# Patient Record
Sex: Male | Born: 1940 | Race: White | Hispanic: No | Marital: Married | Smoking: Former smoker
Health system: Southern US, Community
[De-identification: ages and names within clinical notes are randomized; demographics above are authoritative.]

## PROBLEM LIST (undated history)

## (undated) DIAGNOSIS — I714 Abdominal aortic aneurysm, without rupture, unspecified: Secondary | ICD-10-CM

## (undated) DIAGNOSIS — E119 Type 2 diabetes mellitus without complications: Secondary | ICD-10-CM

## (undated) DIAGNOSIS — C3492 Malignant neoplasm of unspecified part of left bronchus or lung: Secondary | ICD-10-CM

## (undated) DIAGNOSIS — C3490 Malignant neoplasm of unspecified part of unspecified bronchus or lung: Secondary | ICD-10-CM

## (undated) DIAGNOSIS — Z720 Tobacco use: Secondary | ICD-10-CM

## (undated) DIAGNOSIS — G4733 Obstructive sleep apnea (adult) (pediatric): Secondary | ICD-10-CM

## (undated) DIAGNOSIS — E785 Hyperlipidemia, unspecified: Secondary | ICD-10-CM

## (undated) DIAGNOSIS — D649 Anemia, unspecified: Secondary | ICD-10-CM

## (undated) DIAGNOSIS — G4700 Insomnia, unspecified: Secondary | ICD-10-CM

## (undated) DIAGNOSIS — I480 Paroxysmal atrial fibrillation: Secondary | ICD-10-CM

## (undated) DIAGNOSIS — I10 Essential (primary) hypertension: Secondary | ICD-10-CM

## (undated) DIAGNOSIS — Z923 Personal history of irradiation: Secondary | ICD-10-CM

## (undated) DIAGNOSIS — I2510 Atherosclerotic heart disease of native coronary artery without angina pectoris: Secondary | ICD-10-CM

## (undated) DIAGNOSIS — L409 Psoriasis, unspecified: Secondary | ICD-10-CM

## (undated) DIAGNOSIS — J449 Chronic obstructive pulmonary disease, unspecified: Secondary | ICD-10-CM

## (undated) HISTORY — DX: Malignant neoplasm of unspecified part of left bronchus or lung: C34.92

## (undated) HISTORY — DX: Paroxysmal atrial fibrillation: I48.0

## (undated) HISTORY — DX: Atherosclerotic heart disease of native coronary artery without angina pectoris: I25.10

## (undated) HISTORY — DX: Hyperlipidemia, unspecified: E78.5

## (undated) HISTORY — PX: OTHER SURGICAL HISTORY: SHX169

## (undated) HISTORY — DX: Obstructive sleep apnea (adult) (pediatric): G47.33

## (undated) HISTORY — DX: Insomnia, unspecified: G47.00

## (undated) HISTORY — PX: EXPLORATORY LAPAROTOMY: SUR591

## (undated) HISTORY — DX: Psoriasis, unspecified: L40.9

## (undated) HISTORY — DX: Malignant neoplasm of unspecified part of unspecified bronchus or lung: C34.90

## (undated) HISTORY — PX: APPENDECTOMY: SHX54

## (undated) HISTORY — DX: Essential (primary) hypertension: I10

## (undated) HISTORY — DX: Tobacco use: Z72.0

---

## 1997-01-04 HISTORY — PX: CARDIAC CATHETERIZATION: SHX172

## 2003-07-18 ENCOUNTER — Encounter: Admission: RE | Admit: 2003-07-18 | Discharge: 2003-10-16 | Payer: Self-pay | Admitting: Gastroenterology

## 2004-07-28 ENCOUNTER — Emergency Department (HOSPITAL_COMMUNITY): Admission: EM | Admit: 2004-07-28 | Discharge: 2004-07-28 | Payer: Self-pay | Admitting: Emergency Medicine

## 2005-01-04 HISTORY — PX: OTHER SURGICAL HISTORY: SHX169

## 2005-09-15 ENCOUNTER — Encounter: Admission: RE | Admit: 2005-09-15 | Discharge: 2005-09-15 | Payer: Self-pay | Admitting: Cardiology

## 2005-09-17 ENCOUNTER — Ambulatory Visit (HOSPITAL_COMMUNITY): Admission: RE | Admit: 2005-09-17 | Discharge: 2005-09-18 | Payer: Self-pay | Admitting: Cardiology

## 2005-09-23 ENCOUNTER — Encounter: Admission: RE | Admit: 2005-09-23 | Discharge: 2005-09-23 | Payer: Self-pay | Admitting: Cardiology

## 2005-10-05 ENCOUNTER — Ambulatory Visit: Payer: Self-pay | Admitting: Critical Care Medicine

## 2005-10-06 ENCOUNTER — Ambulatory Visit: Admission: RE | Admit: 2005-10-06 | Discharge: 2005-10-06 | Payer: Self-pay | Admitting: Critical Care Medicine

## 2005-10-07 ENCOUNTER — Ambulatory Visit (HOSPITAL_COMMUNITY): Admission: RE | Admit: 2005-10-07 | Discharge: 2005-10-07 | Payer: Self-pay | Admitting: Critical Care Medicine

## 2005-10-14 ENCOUNTER — Ambulatory Visit: Admission: RE | Admit: 2005-10-14 | Discharge: 2006-01-12 | Payer: Self-pay | Admitting: Radiation Oncology

## 2005-10-15 ENCOUNTER — Encounter: Admission: RE | Admit: 2005-10-15 | Discharge: 2005-10-15 | Payer: Self-pay | Admitting: Thoracic Surgery

## 2005-10-17 ENCOUNTER — Encounter: Admission: RE | Admit: 2005-10-17 | Discharge: 2005-10-17 | Payer: Self-pay | Admitting: Thoracic Surgery

## 2005-11-09 ENCOUNTER — Inpatient Hospital Stay (HOSPITAL_COMMUNITY): Admission: RE | Admit: 2005-11-09 | Discharge: 2005-11-13 | Payer: Self-pay | Admitting: Thoracic Surgery

## 2005-11-09 ENCOUNTER — Encounter (INDEPENDENT_AMBULATORY_CARE_PROVIDER_SITE_OTHER): Payer: Self-pay | Admitting: Specialist

## 2005-11-12 ENCOUNTER — Ambulatory Visit: Payer: Self-pay | Admitting: Internal Medicine

## 2005-11-30 ENCOUNTER — Encounter: Admission: RE | Admit: 2005-11-30 | Discharge: 2005-11-30 | Payer: Self-pay | Admitting: Thoracic Surgery

## 2005-12-14 LAB — CBC WITH DIFFERENTIAL/PLATELET
BASO%: 0.3 % (ref 0.0–2.0)
Basophils Absolute: 0 10*3/uL (ref 0.0–0.1)
Eosinophils Absolute: 0.5 10*3/uL (ref 0.0–0.5)
HCT: 38.7 % (ref 38.7–49.9)
HGB: 12.8 g/dL — ABNORMAL LOW (ref 13.0–17.1)
MONO#: 0.6 10*3/uL (ref 0.1–0.9)
NEUT#: 5.3 10*3/uL (ref 1.5–6.5)
NEUT%: 68.2 % (ref 40.0–75.0)
Platelets: 227 10*3/uL (ref 145–400)
WBC: 7.7 10*3/uL (ref 4.0–10.0)
lymph#: 1.3 10*3/uL (ref 0.9–3.3)

## 2005-12-14 LAB — COMPREHENSIVE METABOLIC PANEL
ALT: 12 U/L (ref 0–53)
CO2: 27 mEq/L (ref 19–32)
Calcium: 8.6 mg/dL (ref 8.4–10.5)
Chloride: 108 mEq/L (ref 96–112)
Creatinine, Ser: 1.03 mg/dL (ref 0.40–1.50)
Glucose, Bld: 104 mg/dL — ABNORMAL HIGH (ref 70–99)

## 2006-01-12 ENCOUNTER — Encounter: Admission: RE | Admit: 2006-01-12 | Discharge: 2006-01-12 | Payer: Self-pay | Admitting: Thoracic Surgery

## 2006-02-08 ENCOUNTER — Inpatient Hospital Stay (HOSPITAL_COMMUNITY): Admission: AD | Admit: 2006-02-08 | Discharge: 2006-02-10 | Payer: Self-pay | Admitting: Cardiology

## 2006-03-23 ENCOUNTER — Ambulatory Visit: Payer: Self-pay | Admitting: Thoracic Surgery

## 2006-03-23 ENCOUNTER — Encounter: Admission: RE | Admit: 2006-03-23 | Discharge: 2006-03-23 | Payer: Self-pay | Admitting: Thoracic Surgery

## 2006-05-31 ENCOUNTER — Encounter: Admission: RE | Admit: 2006-05-31 | Discharge: 2006-05-31 | Payer: Self-pay | Admitting: Thoracic Surgery

## 2006-05-31 ENCOUNTER — Ambulatory Visit: Payer: Self-pay | Admitting: Thoracic Surgery

## 2006-06-03 ENCOUNTER — Ambulatory Visit (HOSPITAL_COMMUNITY): Admission: RE | Admit: 2006-06-03 | Discharge: 2006-06-03 | Payer: Self-pay | Admitting: Cardiology

## 2006-06-17 ENCOUNTER — Ambulatory Visit: Payer: Self-pay | Admitting: Internal Medicine

## 2006-06-28 ENCOUNTER — Ambulatory Visit (HOSPITAL_COMMUNITY): Admission: RE | Admit: 2006-06-28 | Discharge: 2006-06-28 | Payer: Self-pay | Admitting: Internal Medicine

## 2006-07-12 LAB — COMPREHENSIVE METABOLIC PANEL
ALT: 23 U/L (ref 0–53)
CO2: 24 mEq/L (ref 19–32)
Calcium: 8.9 mg/dL (ref 8.4–10.5)
Chloride: 105 mEq/L (ref 96–112)
Sodium: 139 mEq/L (ref 135–145)
Total Protein: 6.8 g/dL (ref 6.0–8.3)

## 2006-07-12 LAB — CBC WITH DIFFERENTIAL/PLATELET
BASO%: 0.3 % (ref 0.0–2.0)
HCT: 44.1 % (ref 38.7–49.9)
MCHC: 35.3 g/dL (ref 32.0–35.9)
MONO#: 0.6 10*3/uL (ref 0.1–0.9)
NEUT#: 4.8 10*3/uL (ref 1.5–6.5)
NEUT%: 68.4 % (ref 40.0–75.0)
RBC: 5.32 10*6/uL (ref 4.20–5.71)
WBC: 7.1 10*3/uL (ref 4.0–10.0)
lymph#: 1.3 10*3/uL (ref 0.9–3.3)

## 2006-08-09 ENCOUNTER — Encounter: Admission: RE | Admit: 2006-08-09 | Discharge: 2006-08-09 | Payer: Self-pay | Admitting: Thoracic Surgery

## 2006-08-09 ENCOUNTER — Ambulatory Visit: Payer: Self-pay | Admitting: Thoracic Surgery

## 2006-11-29 ENCOUNTER — Ambulatory Visit: Admission: RE | Admit: 2006-11-29 | Discharge: 2007-01-02 | Payer: Self-pay | Admitting: Radiation Oncology

## 2007-01-04 ENCOUNTER — Ambulatory Visit: Payer: Self-pay | Admitting: Internal Medicine

## 2007-01-09 ENCOUNTER — Ambulatory Visit (HOSPITAL_COMMUNITY): Admission: RE | Admit: 2007-01-09 | Discharge: 2007-01-09 | Payer: Self-pay | Admitting: Internal Medicine

## 2007-01-09 LAB — COMPREHENSIVE METABOLIC PANEL
ALT: 21 U/L (ref 0–53)
CO2: 27 mEq/L (ref 19–32)
Calcium: 9 mg/dL (ref 8.4–10.5)
Chloride: 107 mEq/L (ref 96–112)
Sodium: 141 mEq/L (ref 135–145)
Total Bilirubin: 0.6 mg/dL (ref 0.3–1.2)
Total Protein: 6.5 g/dL (ref 6.0–8.3)

## 2007-01-09 LAB — CBC WITH DIFFERENTIAL/PLATELET
BASO%: 0.2 % (ref 0.0–2.0)
MCHC: 34.1 g/dL (ref 32.0–35.9)
MONO#: 0.6 10*3/uL (ref 0.1–0.9)
RBC: 5.68 10*6/uL (ref 4.20–5.71)
RDW: 14.4 % (ref 11.2–14.6)
WBC: 8.5 10*3/uL (ref 4.0–10.0)
lymph#: 1.6 10*3/uL (ref 0.9–3.3)

## 2007-01-11 ENCOUNTER — Ambulatory Visit: Payer: Self-pay | Admitting: Thoracic Surgery

## 2007-01-12 ENCOUNTER — Encounter: Payer: Self-pay | Admitting: Critical Care Medicine

## 2007-07-04 ENCOUNTER — Ambulatory Visit: Payer: Self-pay | Admitting: Internal Medicine

## 2007-07-13 ENCOUNTER — Ambulatory Visit (HOSPITAL_COMMUNITY): Admission: RE | Admit: 2007-07-13 | Discharge: 2007-07-13 | Payer: Self-pay | Admitting: Internal Medicine

## 2007-07-13 ENCOUNTER — Ambulatory Visit: Payer: Self-pay | Admitting: Thoracic Surgery

## 2007-07-13 LAB — CBC WITH DIFFERENTIAL/PLATELET
Basophils Absolute: 0 10*3/uL (ref 0.0–0.1)
HCT: 46.3 % (ref 38.7–49.9)
HGB: 15.6 g/dL (ref 13.0–17.1)
LYMPH%: 24.8 % (ref 14.0–48.0)
MCH: 28.4 pg (ref 28.0–33.4)
MCHC: 33.7 g/dL (ref 32.0–35.9)
MONO#: 0.5 10*3/uL (ref 0.1–0.9)
NEUT%: 64.8 % (ref 40.0–75.0)
Platelets: 165 10*3/uL (ref 145–400)
WBC: 7.1 10*3/uL (ref 4.0–10.0)
lymph#: 1.8 10*3/uL (ref 0.9–3.3)

## 2007-07-13 LAB — COMPREHENSIVE METABOLIC PANEL
BUN: 13 mg/dL (ref 6–23)
CO2: 29 mEq/L (ref 19–32)
Calcium: 9 mg/dL (ref 8.4–10.5)
Chloride: 106 mEq/L (ref 96–112)
Creatinine, Ser: 1.17 mg/dL (ref 0.40–1.50)
Total Bilirubin: 0.8 mg/dL (ref 0.3–1.2)

## 2007-07-16 ENCOUNTER — Ambulatory Visit: Payer: Self-pay | Admitting: *Deleted

## 2007-07-17 ENCOUNTER — Inpatient Hospital Stay (HOSPITAL_COMMUNITY): Admission: EM | Admit: 2007-07-17 | Discharge: 2007-07-18 | Payer: Self-pay | Admitting: Emergency Medicine

## 2007-07-17 ENCOUNTER — Encounter (INDEPENDENT_AMBULATORY_CARE_PROVIDER_SITE_OTHER): Payer: Self-pay | Admitting: Cardiology

## 2008-01-01 ENCOUNTER — Ambulatory Visit: Payer: Self-pay | Admitting: Internal Medicine

## 2008-01-03 ENCOUNTER — Ambulatory Visit (HOSPITAL_COMMUNITY): Admission: RE | Admit: 2008-01-03 | Discharge: 2008-01-03 | Payer: Self-pay | Admitting: Internal Medicine

## 2008-01-03 LAB — CBC WITH DIFFERENTIAL/PLATELET
BASO%: 0.2 % (ref 0.0–2.0)
Basophils Absolute: 0 10*3/uL (ref 0.0–0.1)
EOS%: 6 % (ref 0.0–7.0)
HCT: 48.7 % (ref 38.7–49.9)
HGB: 16.5 g/dL (ref 13.0–17.1)
LYMPH%: 22.1 % (ref 14.0–48.0)
MCH: 29.2 pg (ref 28.0–33.4)
MCHC: 33.9 g/dL (ref 32.0–35.9)
MCV: 86.1 fL (ref 81.6–98.0)
NEUT%: 62.2 % (ref 40.0–75.0)
Platelets: 155 10*3/uL (ref 145–400)

## 2008-01-03 LAB — COMPREHENSIVE METABOLIC PANEL
ALT: 30 U/L (ref 0–53)
AST: 26 U/L (ref 0–37)
BUN: 15 mg/dL (ref 6–23)
Calcium: 8.8 mg/dL (ref 8.4–10.5)
Chloride: 104 mEq/L (ref 96–112)
Creatinine, Ser: 1.19 mg/dL (ref 0.40–1.50)
Total Bilirubin: 1.1 mg/dL (ref 0.3–1.2)

## 2008-01-10 ENCOUNTER — Encounter: Payer: Self-pay | Admitting: Critical Care Medicine

## 2008-07-30 ENCOUNTER — Encounter: Admission: RE | Admit: 2008-07-30 | Discharge: 2008-07-30 | Payer: Self-pay | Admitting: Thoracic Surgery

## 2008-07-30 ENCOUNTER — Ambulatory Visit: Payer: Self-pay | Admitting: Thoracic Surgery

## 2009-01-01 ENCOUNTER — Ambulatory Visit: Payer: Self-pay | Admitting: Internal Medicine

## 2009-01-16 ENCOUNTER — Ambulatory Visit (HOSPITAL_COMMUNITY): Admission: RE | Admit: 2009-01-16 | Discharge: 2009-01-16 | Payer: Self-pay | Admitting: Internal Medicine

## 2009-01-16 LAB — CBC WITH DIFFERENTIAL/PLATELET
BASO%: 0.3 % (ref 0.0–2.0)
EOS%: 2.5 % (ref 0.0–7.0)
HCT: 47.2 % (ref 38.4–49.9)
LYMPH%: 22.5 % (ref 14.0–49.0)
MCH: 29.6 pg (ref 27.2–33.4)
MCHC: 33.8 g/dL (ref 32.0–36.0)
MONO#: 0.7 10*3/uL (ref 0.1–0.9)
NEUT%: 67.6 % (ref 39.0–75.0)
Platelets: 156 10*3/uL (ref 140–400)

## 2009-01-16 LAB — COMPREHENSIVE METABOLIC PANEL
ALT: 20 U/L (ref 0–53)
BUN: 12 mg/dL (ref 6–23)
CO2: 31 mEq/L (ref 19–32)
Creatinine, Ser: 1.12 mg/dL (ref 0.40–1.50)
Total Bilirubin: 1 mg/dL (ref 0.3–1.2)

## 2009-01-21 ENCOUNTER — Encounter: Payer: Self-pay | Admitting: Critical Care Medicine

## 2009-03-22 ENCOUNTER — Inpatient Hospital Stay (HOSPITAL_COMMUNITY): Admission: EM | Admit: 2009-03-22 | Discharge: 2009-03-23 | Payer: Self-pay | Admitting: Cardiovascular Disease

## 2009-05-04 ENCOUNTER — Inpatient Hospital Stay (HOSPITAL_COMMUNITY): Admission: EM | Admit: 2009-05-04 | Discharge: 2009-05-06 | Payer: Self-pay | Admitting: General Surgery

## 2009-05-04 ENCOUNTER — Ambulatory Visit: Payer: Self-pay | Admitting: Cardiology

## 2009-05-05 ENCOUNTER — Encounter (INDEPENDENT_AMBULATORY_CARE_PROVIDER_SITE_OTHER): Payer: Self-pay | Admitting: Cardiology

## 2009-07-09 ENCOUNTER — Ambulatory Visit: Payer: Self-pay | Admitting: Thoracic Surgery

## 2009-07-09 ENCOUNTER — Encounter: Admission: RE | Admit: 2009-07-09 | Discharge: 2009-07-09 | Payer: Self-pay | Admitting: Thoracic Surgery

## 2010-01-14 ENCOUNTER — Ambulatory Visit: Payer: Self-pay | Admitting: Internal Medicine

## 2010-01-16 ENCOUNTER — Ambulatory Visit (HOSPITAL_COMMUNITY): Admission: RE | Admit: 2010-01-16 | Payer: Self-pay | Source: Home / Self Care | Admitting: Internal Medicine

## 2010-01-21 ENCOUNTER — Ambulatory Visit (HOSPITAL_COMMUNITY): Admission: RE | Admit: 2010-01-21 | Payer: Self-pay | Source: Home / Self Care | Admitting: Internal Medicine

## 2010-01-25 ENCOUNTER — Encounter: Payer: Self-pay | Admitting: Internal Medicine

## 2010-01-25 ENCOUNTER — Encounter: Payer: Self-pay | Admitting: Thoracic Surgery

## 2010-01-26 ENCOUNTER — Encounter: Payer: Self-pay | Admitting: Internal Medicine

## 2010-02-03 ENCOUNTER — Encounter
Admission: RE | Admit: 2010-02-03 | Discharge: 2010-02-03 | Payer: Self-pay | Source: Home / Self Care | Attending: Thoracic Surgery | Admitting: Thoracic Surgery

## 2010-02-03 ENCOUNTER — Ambulatory Visit
Admission: RE | Admit: 2010-02-03 | Discharge: 2010-02-03 | Payer: Self-pay | Source: Home / Self Care | Attending: Thoracic Surgery | Admitting: Thoracic Surgery

## 2010-02-03 NOTE — Assessment & Plan Note (Signed)
OFFICE VISIT  KADEEM, HYLE DOB:  11-Feb-1940                                        February 03, 2010 CHART #:  [id number]  The patient came today, he is now 4-1/2 years since we did his surgery. His blood pressure is 135/76, pulse 80, respirations 16, sats were 98%. His lungs are clear to auscultation and percussion.  His chest x-ray showed no evidence of recurrence of the cancer. I will see him back again for another CT scan.  Ines Bloomer, M.D. Electronically Signed  DPB/MEDQ  D:  02/03/2010  T:  02/03/2010  Job:  [id number]

## 2010-02-03 NOTE — Letter (Signed)
[facility]   Imported By: Sherian Rein 02/18/2009 07:37:36  _____________________________________________________________________  External Attachment:    Type:   Image     Comment:   External Document

## 2010-02-04 ENCOUNTER — Encounter: Payer: Self-pay | Admitting: Thoracic Surgery

## 2010-02-04 ENCOUNTER — Encounter: Payer: Self-pay | Admitting: Internal Medicine

## 2010-03-24 LAB — COMPREHENSIVE METABOLIC PANEL
ALT: 20 U/L (ref 0–53)
AST: 22 U/L (ref 0–37)
Albumin: 3.6 g/dL (ref 3.5–5.2)
CO2: 25 mEq/L (ref 19–32)
Calcium: 8.8 mg/dL (ref 8.4–10.5)
Creatinine, Ser: 1.07 mg/dL (ref 0.4–1.5)
Sodium: 139 mEq/L (ref 135–145)
Total Protein: 6.3 g/dL (ref 6.0–8.3)

## 2010-03-24 LAB — TSH: TSH: 0.67 u[IU]/mL (ref 0.350–4.500)

## 2010-03-24 LAB — CK TOTAL AND CKMB (NOT AT ARMC)
CK, MB: 6.2 ng/mL (ref 0.3–4.0)
Relative Index: 2.5 (ref 0.0–2.5)
Total CK: 248 U/L — ABNORMAL HIGH (ref 7–232)

## 2010-03-24 LAB — POCT CARDIAC MARKERS
Myoglobin, poc: 96.7 ng/mL (ref 12–200)
Troponin i, poc: <0.05 ng/mL (ref 0.00–0.09)

## 2010-03-24 LAB — GLUCOSE, CAPILLARY: Glucose-Capillary: 132 mg/dL — ABNORMAL HIGH (ref 70–99)

## 2010-03-24 LAB — CBC
HCT: 45.8 % (ref 39.0–52.0)
MCHC: 33.8 g/dL (ref 30.0–36.0)
Platelets: 135 10*3/uL — ABNORMAL LOW (ref 150–400)
Platelets: 142 10*3/uL — ABNORMAL LOW (ref 150–400)
RBC: 5.16 MIL/uL (ref 4.22–5.81)
WBC: 7.7 10*3/uL (ref 4.0–10.5)
WBC: 9.2 10*3/uL (ref 4.0–10.5)

## 2010-03-24 LAB — HEPARIN LEVEL (UNFRACTIONATED): Heparin Unfractionated: <0.1 IU/mL — ABNORMAL LOW (ref 0.30–0.70)

## 2010-03-24 LAB — PROTIME-INR
Prothrombin Time: 21.8 seconds — ABNORMAL HIGH (ref 11.6–15.2)
Prothrombin Time: 25.3 seconds — ABNORMAL HIGH (ref 11.6–15.2)
Prothrombin Time: 26.2 seconds — ABNORMAL HIGH (ref 11.6–15.2)

## 2010-03-24 LAB — TROPONIN I: Troponin I: 0.03 ng/mL (ref 0.00–0.06)

## 2010-03-29 LAB — COMPREHENSIVE METABOLIC PANEL
ALT: 18 U/L (ref 0–53)
AST: 17 U/L (ref 0–37)
Albumin: 3.3 g/dL — ABNORMAL LOW (ref 3.5–5.2)
Alkaline Phosphatase: 64 U/L (ref 39–117)
BUN: 13 mg/dL (ref 6–23)
Chloride: 107 mEq/L (ref 96–112)
Potassium: 4 mEq/L (ref 3.5–5.1)
Sodium: 142 mEq/L (ref 135–145)
Total Bilirubin: 0.5 mg/dL (ref 0.3–1.2)
Total Protein: 5.9 g/dL — ABNORMAL LOW (ref 6.0–8.3)

## 2010-03-29 LAB — CARDIAC PANEL(CRET KIN+CKTOT+MB+TROPI)
CK, MB: 4 ng/mL (ref 0.3–4.0)
Relative Index: 3.6 — ABNORMAL HIGH (ref 0.0–2.5)
Troponin I: 0.05 ng/mL (ref 0.00–0.06)
Troponin I: 0.05 ng/mL (ref 0.00–0.06)

## 2010-03-29 LAB — PROTIME-INR: INR: 1.07 (ref 0.00–1.49)

## 2010-03-29 LAB — CBC
HCT: 44 % (ref 39.0–52.0)
Platelets: 138 10*3/uL — ABNORMAL LOW (ref 150–400)
WBC: 8.2 10*3/uL (ref 4.0–10.5)

## 2010-05-19 NOTE — Letter (Signed)
July 30, 2008   Lajuana Matte, MD  [phone]  [place], [place] [address]   Re:  Travis [name], Travis [name]              DOB:  [birth year]   Dear Dr. Shirline Frees:   I saw the patient back in followup today.  He is now almost 3 years  since his surgery of wedge resection with seed implantations.  The CT  scan today showed no evidence of recurrence.  He is doing well overall.  He will see you again in 6 months and I will see him back again in 1  year with another CT scan.  His blood pressure was 123/74, pulse 56,  respirations 18, sats were 96%.  Lungs were clear to auscultation and  percussion.   Sincerely,   Ines Bloomer, M.D.  Electronically Signed   DPB/MEDQ  D:  07/30/2008  T:  07/31/2008  Job:  [id number]

## 2010-05-19 NOTE — Letter (Signed)
May 31, 2006   Molli Hazard A. Kathrynn Running, M.D.  [address] [facility]  [place], [place] [address]   Re:  Travis [name], Travis [name]              DOB:  1940-08-23   Dear Susy Frizzle:   I appreciate the referral of Mr. Ausborn.  He comes back with 6 months  into his surgery.  We did his CT scan; his CT scan shows no evidence of  recurrence of his cancer, but I do see an new 5-mm nodule in the right  lobe which I think we will have to follow. The area where you did the  seed implantation looks good.  He is feeling well.  He heart status has  been stable and is followed by Dr. Armanda Magic.  I plan to see him back  again in 3 months and repeat his CT scan and look at this 5-mm nodule  again.   His blood pressure was 140/82, pulse 72, respirations 18, sats were 95%.  Lungs were clear to auscultation and percussion.  Heart:  Regular sinus  rhythm, no murmurs.  Incisions were well healed.   I appreciate the opportunity of seeing Mr. Towell.   Ines Bloomer, M.D.  Electronically Signed   DPB/MEDQ  D:  05/31/2006  T:  05/31/2006  Job:  [id number]   cc:   Armanda Magic, M.D.

## 2010-05-19 NOTE — Letter (Signed)
January 11, 2007   Lajuana Matte, MD  [phone]  [place], [place] [address]   Re:  [name], Travis              DOB:  1940-10-04   Dear Arbutus Ped:   I saw Travis [name] in the office today.  The CT scan that you ordered  showed no evidence of recurrence.  He is now eighteen months since we  did his resection with seed implantation.  I went ahead and ordered  another CT scan in six months and I will see him back again at that  time.  His blood pressure is 153/80.  Pulse 68.  Respirations 18.  Saturations were 94%.   Ines Bloomer, M.D.  Electronically Signed   DPB/MEDQ  D:  01/11/2007  T:  01/11/2007  Job:  [id number]

## 2010-05-19 NOTE — H&P (Signed)
NAMECLIFORD, Travis [name]              ACCOUNT NO.:  [id number]   MEDICAL RECORD NO.:  [id number]          PATIENT TYPE:  INP   LOCATION:  2020                         FACILITY:  [facility]   PHYSICIAN:  Adela Ports, MD   DATE OF BIRTH:  [birth year]   DATE OF ADMISSION:  07/16/2007  DATE OF DISCHARGE:                              HISTORY & PHYSICAL   CHIEF COMPLAINT:  Atrial fibrillation.   HISTORY OF PRESENT ILLNESS:  Travis [name] is a 70 year old male with a  history of coronary artery disease, paroxysmal atrial fibrillation, and  lung cancer status post resection.  His first symptomatic episode of  atrial fibrillation was 3 years ago.  He was hospitalized at that time,  but apparently did not require a cardioversion.  He has not had any  palpitations or symptomatic AFib that he is aware of since then until 2  weeks ago when he had an episode of AFib and palpitations.  He saw his  primary care doctor who recommended he go to the hospital for further  evaluation, but he decided to wait and he reverted to sinus rhythm  spontaneously.  Today, he again developed tachy palpitations associated  with chest pressure.  He went to a fire station and was put on a heart  monitor and found to be in AFib with a heart rate of 145.  He was given  nitroglycerin and metoprolol at that time and his blood pressure dropped  into the systolic in the 80s.  He was transferred to [facility]  Emergency Department and treated with IV fluids and his blood pressure  recovered.  He subsequently reverted to sinus rhythm with heart rate in  the 50s and 60s and an improved blood pressure in the 100s to one-teens  over 60s.   Travis [name] also complains of left-sided posterior neck and occipital  pain with tingling in his left ear.  This has been going on for  approximately 2 weeks and does not seem to be related to his atrial  fibrillation or his chest pressure.   PAST MEDICAL HISTORY:  1. Coronary artery disease  status post atherectomy of the RCA in 1994      with catheterization in 1999 showing 100% occluded RCA with left to      right collaterals.  A subsequent catheterization in 2007 for      unstable angina had demonstrated a significant mid LAD disease that      was stented with a TAXUS drug-eluting stent.  Again with unstable      angina in February 2008, he underwent catheterization that showed      significant in-stent thrombosis that was treated with overlapping      TAXUS drug-eluting stents with placement verified by IVUS.  2. Hypertension.  3. Hyperlipidemia.  4. Paroxysmal atrial fibrillation, as above.  5. Lung cancer status post lobectomy, chemotherapy, and radioactive      seed placement.  6. Ongoing tobacco use.  7. Sleep apnea.  8. Insomnia.  9. Psoriasis.   ALLERGIES:  No known drug allergies.   MEDICATIONS:  1. Plavix 75  mg p.o. daily (the patient ran out about 2 months ago and      has not renewed this prescription).  2. Aspirin 325 mg daily.  3. Metoprolol 12.5 mg p.o. b.i.d.  4. Protonix 40 mg p.o. daily.  5. Ambien as needed.  6. Sublingual nitroglycerin as needed.  7. Crestor 20 mg p.o. daily.  8. Fish oil 1000 mg p.o. daily.   SOCIAL HISTORY:  Travis [name] is married with 3 children.  He continues to  smoke although he quit for a year recently, but has since restarted.   FAMILY HISTORY:  Mother died of an MI at the age of 44.  Father had an  MI at the age of 10 and died of an MI at age 9.   REVIEW OF SYSTEMS:  As per HPI.  Otherwise, he has a history of  psoriasis with some rash on his elbows and knees.  He has no history of  TIA or stroke.  Otherwise, the 9-system review of system is negative in  detail.  Code status is full.   PHYSICAL EXAMINATION:  VITAL SIGNS:  T-max temperature 97.4, heart rate  was 145 on admission and is currently in the 50s to 60s in sinus rhythm.  Blood pressure 106/61, and oxygen saturation 96% on room air.  GENERAL:  He is a  pleasant man in no apparent distress.  HEENT:  Extraocular movements intact.  Mucous membranes moist.  NECK:  Supple with some mild tenderness on the posterior left neck and  occiput.  There are no carotid bruits and jugular venous pressure is  normal.  LUNGS:  Clear to auscultation bilaterally.  CARDIOVASCULAR:  Regular rate and rhythm.  Normal S1 and S2.  No  murmurs, rubs, or gallops.  SKIN:  There are some patchy areas of psoriasis on the extensor surfaces  of the elbows and knees bilaterally.  ABDOMEN:  Soft, nontender, and nondistended with positive bowel sounds.  No rebound or guarding.  No hepatosplenomegaly.  EXTREMITIES:  No clubbing, cyanosis, or edema.  MUSCULOSKELETAL:  Unremarkable.  NEUROLOGIC:  Unremarkable.   RADIOLOGY:  Chest x-ray shows surgical changes consistent with left  lobectomy in the past.  There is cardiomegaly, but no evidence of  congestive heart failure or edema.  CT angiography of the head and neck  showed bilateral internal carotid artery plaques, but no significant  obstruction.  There is occlusion versus high-grade stenosis of the  proximal right vertebral artery.  EKG shows atrial fibrillation at a  rate of 138 with normal axis and intervals.  There are no Q-waves or  ischemic ST-T wave changes.  Compared to EKG from February 09, 2006, the  patient is now in atrial fibrillation.   LABORATORY:  Notable for normal CBC, creatinine of 1.2, normal liver  panel with an albumin of 3.2, and normal coags.  BNP is mildly elevated  at 153, point-of-care cardiac enzymes were drawn twice with in about an  hour of each other demonstrating normal MB of 2.1 and 2.2 and normal  troponin I of less than 0.05 twice, and normal myoglobin of 81.6 and 83.   ASSESSMENT AND PLAN:  Travis [name] is a 70 year old gentleman with a  history of coronary artery disease, paroxysmal atrial fibrillation, and  lung cancer.  He presents today with symptomatic atrial fibrillation  with  rapid ventricular rate associated with chest pressure.  He also  became hypotensive after receiving nitrates and beta-blockers for his  atrial fibrillation with rapid ventricular  response.  He is currently in  sinus rhythm.  1. Atrial fibrillation.  Given that he has had recurrent episodes in      the past 2 weeks, it is worrisome that he has an increasing burden      of atrial fibrillation.  At this point, however, I think it would      be reasonable to continue a rate-control strategy with increased      dose of beta-blocker.  His heart rate is currently borderline in      the 50s and 60s, so this will have to be monitored closely.  If he      does not tolerate any increased dose of beta-blocker, then options      would include evaluation for pacemaker placement and then, we can      titrate up his rate control agents versus initiation of drug      therapy for maintenance of rhythm control.  Given that he has only      had 2 episodes over the past few years, I think rate control is      reasonable for now, but with his level of symptomatology, he may      end up needing something such as dofetilide for an attempt at      rhythm control.  In the meantime, I will continue him on aspirin      and Plavix for stroke prevention.  He has a [place] score of 1      currently, and it would be better to avoid triple therapy by adding      Coumadin without a strong indication.  In addition, we will check a      TSH to make sure that he does not have hyperthyroidism contributing      to his recent increase in atrial fibrillation.  We will also check      hemoglobin A1c to rule out diabetes, as that would affect our risk      stratification for anticoagulation.  2. Coronary artery disease.  Travis [name] has known coronary artery      disease, and we will continue him on aspirin, statin, and beta-      blocker.  We will restart his Plavix, as he should probably be on      this indefinitely, given his  multiple layers of drug-eluting stents      and history of in-stent disease.  We will also continue his fish      oil.  Overnight, we will cycle his enzymes to rule out myocardial      infarction in the setting of his atrial fibrillation with rapid      ventricular response and chest pressure, although he does not seem      to clinically fit an acute coronary syndrome picture.  3. Occipital headache and neck pain.  I suspect that this is nerve      entrapment or other form of occipital neuralgia.  This could be      followed up as an outpatient.  4. Lovenox for DVT prophylaxis while he is in the hospital.  I suspect      that this would be a short admission.  5. Code status is full.      Adela Ports, MD  Electronically Signed     DWM/MEDQ  D:  07/17/2007  T:  07/17/2007  Job:  [id number]

## 2010-05-19 NOTE — Assessment & Plan Note (Signed)
OFFICE VISIT   Travis [name], Travis [name]  DOB:  25-Oct-1940                                        July 09, 2009  CHART #:  [id number]   The patient came for followup of his CT scan.  He is now almost 3-1/2  years since a stage IA non-small-cell lung cancer and it is completely  negative with no evidence of recurrence.  We will see him again in 6  months with a chest x-ray and a final CT in 1 year.   Ines Bloomer, M.D.  Electronically Signed   DPB/MEDQ  D:  07/09/2009  T:  07/10/2009  Job:  [id number]   cc:   Lajuana Matte, MD

## 2010-05-19 NOTE — Assessment & Plan Note (Signed)
OFFICE VISIT   Travis, [name]  DOB:  1940-12-08                                        July 13, 2007  CHART #:  [id number]   HISTORY:  The patient came today, now 18 months since his surgery, and  we repeated a CT scan which shows no evidence of recurrence.  His blood  pressure is 160/93, pulse 66, respirations 18, and sats were 94%.  Lungs  are clear to auscultation and percussion.  I plan to see him back in 6  months' time with another CT scan.   Ines Bloomer, M.D.  Electronically Signed   DPB/MEDQ  D:  07/13/2007  T:  07/14/2007  Job:  [id number]   cc:   Lajuana Matte, MD  Artist Pais. Kathrynn Running, M.D.

## 2010-05-19 NOTE — Discharge Summary (Signed)
NAMEJAXSON, Travis [name]              ACCOUNT NO.:  [id number]   MEDICAL RECORD NO.:  [id number]          PATIENT TYPE:  INP   LOCATION:  2020                         FACILITY:  [facility]   PHYSICIAN:  Armanda Magic, M.D.     DATE OF BIRTH:  [birth year]   DATE OF ADMISSION:  07/17/2007  DATE OF DISCHARGE:  07/18/2007                               DISCHARGE SUMMARY   DISCHARGE DIAGNOSES:  1. Paroxysmal atrial fibrillation, discharged in normal sinus rhythm.  2. Coronary artery disease with multiple percutaneous interventions.  3. Hypertension.  4. Hyperlipidemia.  5. History of lung cancer, status post lobectomy, chemotherapy, and      radiation therapy.  6. Former tobacco use.  7. Obstructive sleep apnea.  8. Psoriasis.  9. Insomnia.   HOSPITAL COURSE:  [name] is a 70 year old male patient with  known coronary artery disease who has had multiple interventions to the  right coronary artery and LAD vessels.  Over the past 2 weeks prior to  admission, he has had episodes of atrial fibrillation/palpitations.  He  saw his primary care Shaquana Buel and declined admission and he reverted  spontaneously back to normal sinus rhythm.   On the day of admission, he had palpitations as well as chest pressure.  He went to the fire station and his heart rate was 145 beats per minute.  He was given sublingual nitroglycerin and metoprolol and his blood  pressure decreased.  He was brought into the hospital, continued on beta-  blocker as well as aspirin.  His heart rate was in the 50s to 60s range  and remained in sinus rhythm.  The patient wanted to go home.  He was  watched in the hospital overnight.  He does have resting bradycardia.  We had to decrease the Lopressor dose.  In its place, however, we did  decide eventually to put him on Sectral 200 mg twice a day to suppress  his atrial fibrillation, and hopefully, prevent bradycardia.  He was  watched in the hospital overnight and his heart rate  had transiently  gone up in the 70 range and we felt sending him home.   He is discharged to home with the following medications:  Plavix 75 mg a  day, aspirin 325 mg a day, metoprolol 12.5 mg twice a day has been  stopped, Protonix 40 mg a day, Ambien 5 mg p.r.n., sublingual  nitroglycerin p.r.n., Crestor 20 mg a day, fish oil 1000 mg a day,  Sectral 200 mg twice a day.   The patient is to follow up in the office on July 31, 2007, at 2:40 p.m.  Remain on a low-sodium heart-healthy diet.  Increase activity slowly.      Guy Franco, P.A.      Armanda Magic, M.D.  Electronically Signed    LB/MEDQ  D:  08/11/2007  T:  08/12/2007  Job:  [id number]

## 2010-05-22 NOTE — Cardiovascular Report (Signed)
[name], [name]              ACCOUNT NO.:  [id number]   MEDICAL RECORD NO.:  [id number]          PATIENT TYPE:  INP   LOCATION:  6525                         FACILITY:  [facility]   PHYSICIAN:  Armanda Magic, M.D.     DATE OF BIRTH:  [birth year]   DATE OF PROCEDURE:  02/09/2006  DATE OF DISCHARGE:  02/10/2006                            CARDIAC CATHETERIZATION   REQUESTING PHYSICIAN:  Molly Maduro L. Foy Guadalajara, M.D.   PROCEDURE:  Left heart catheterization, coronary angiography with left  ventriculography.   SURGEON:  Armanda Magic, M.D.   INDICATIONS:  Chest pain, unstable angina.   COMPLICATIONS:  Difficult groin access, IV access via the right femoral  artery #6 French sheath.   HISTORY OF PRESENT ILLNESS:  This is a very pleasant 70 year old obese  white male with a history of known coronary disease status post  atherectomy in 1994 of the RCA and then in 1999 had a cath revealing a  right coronary artery occlusion with left to right collaterals.  He also  had a 90% LAD lesion and Taxus stent was placed.  He was admitted on  February 5th complaining of progressive angina and now presents for  cardiac catheterization.   DESCRIPTION OF PROCEDURE:  The patient was brought to the cardiac  catheterization laboratory in the fasting, nonsedated state.  Informed  consent was obtained.  The patient was connected to continuous heart  rate and pulse oximetry monitoring and blood pressure monitoring.  The  right groin was prepped and draped in the sterile fashion.  Xylocaine 1%  was used for local anesthesia.  Using a modified Seldinger technique, a  6-French sheath was placed in the right femoral artery.  Under  fluoroscopic guidance, a 6-French JL4 catheter was placed in the left  coronary artery.  Multiple cine films were taken of this area along with  40 degree LAO views.  This catheter was then exchanged over a guide wire  for a 6-French JR catheter which was placed under fluoroscopic  guidance  over the right coronary artery.  Multiple cine films were taken at 30  degree RAO and 40 degree LAO views.  This catheter was then exchanged  out over a guide wire for a 6-French angled pigtail catheter which was  placed under fluoroscopic guidance into the left ventricular cavity.  Left ventriculography was performed in the 30 degree RAO view using a  total of 30 mL of contrast at 15 mL per second.  The catheter was then  pulled back across the aortic valve with no significant gradient noted.  The catheter was then placed into the distal aorta and distal  aortography was performed using a total of 20 mL of contrast at 15 mL  per second.  The catheter was then removed over a guide wire.  At the  end of the patient, the patient went onto PCI of the LAD.   RESULTS:  1. Left main coronary artery is widely patent and bifurcates at the      left anterior descending artery and left circumflex artery.  2. Left anterior descending artery is proximally patent.  It gives      rise to a high diagonal 1 branch which is widely patent, just      distal to the take-off of the first diagonal there is a 99%      stenosis in the proximal portion of the left anterior descending      stent with TIMI 1 to 2 flow distally.  3. The left circumflex has a 20-30% proximal narrowing before giving      rise to a very large first obtuse marginal branch.  This obtuse      marginal branch bifurcates into an inferior and superior branch.      The inferior branch is widely patent.  The superior branch is      widely patent and bifurcates into branches which are widely patent.      The on going circumflex traverses the AV groove and gives rise to a      second obtuse marginal branch which is widely patent.  There is      evidence of left to right collaterals from the distal left      circumflex supplying the distal right coronary artery.  4. The right coronary artery is proximally occluded.  5. Left  ventriculography shows normal LV function with EF of 60%.      Left ventricular pressure 162/10 mmHg.  Aortic pressure 164/92      mmHg.  LVEDP 19 mmHg.   Distal aortography shows diffuse disease of the distal aorta.   ASSESSMENT:  1. Severe two vessel coronary disease with occluded right coronary      artery with left to right collaterals and 99% stenosis of the left      anterior descending proximal to the stent.  2. Normal left ventricular function.  3. Distal aortic atherosclerotic vascular disease.  4. Diabetes mellitus.  5. Obesity.  6. Dyslipidemia.   PLAN:  PCI of the left anterior descending, continue aspirin and Plavix.  Aggressive risk factor modification.      Armanda Magic, M.D.  Electronically Signed     TT/MEDQ  D:  03/21/2006  T:  03/21/2006  Job:  [id number]   cc:   Molly Maduro L. Foy Guadal[place]ara, M.D.

## 2010-05-22 NOTE — Cardiovascular Report (Signed)
[name], [name]:  [id number]   MEDICAL RECORD NO.:  [id number]          PATIENT TYPE:  OIB   LOCATION:  2807                         FACILITY:  [facility]   PHYSICIAN:  Travis [name], M.D.   DATE OF BIRTH:  1940/03/23   DATE OF PROCEDURE:  09/17/2005  DATE OF DISCHARGE:                              CARDIAC CATHETERIZATION   INDICATIONS:  Travis [name] has a history of prior coronary artery disease.  He  underwent directional coronary atherectomy of the right coronary in 1994.  A  repeat cardiac catheterization in 1999 demonstrated total occlusion of the  right coronary with collaterals from left to right.  Three days prior to  this procedure, he experienced an episode of angina in the upper chest that  radiated to the neck.  It lasted 20 minutes.  This procedure is being done  to define the coronary anatomy with the concern that there is progression of  disease in the circumflex or left anterior descending.   PROCEDURE PERFORMED:  1. Left heart catheterization.  2. Selective coronary angiogram.  3. Left ventriculography.  4. Taxus stent of the left anterior descending artery.  5. Angio-Seal arteriotomy closure.   DESCRIPTION:  After informed consent, a 6-French sheath was placed in the  right femoral artery.  Some tortuosity in plaquing was noted.  A 6-French A2  multipurpose catheter was used for hemodynamic recordings, left  ventriculography by hand injection, and selective right coronary  angiography.  A #4 left Judkins catheter was used for left coronary  angiography.  After reviewing the anatomy, it was felt that the circumflex  proximal segment contained a 50 to no more than 70% stenosis and that the  mid LAD contained an eccentric, somewhat hazy, greater than 90% stenosis at  the large first diagonal.  It was felt that the culprit site was the  patient's LAD.  After discussion with the patient and wife, we decided to  proceed with PCI on the  LAD.   He received 600 mg of Plavix orally.  Approximately 30 minutes later, PCI  was started.  At the same time that the patient received the Plavix, he was  given a bolus followed by an infusion of Angiomax.  ACT was documented to be  greater than 300 seconds.   We used an XB 4.0 guide catheter and Clinical cytogeneticist guidewire.  We  predilated with a 2.5/12-mm Maverick balloon and deployed a Taxus 16-mm long  x 2.75-mm stent with deployment pressures of 14 atmospheres which was nearly  3 mm in diameter.  Three balloon inflations were performed.  Post  dilatation, there was a nice step-up and step-down.  High pressure balloon  post dilatation was not carried out.  The patient tolerated the procedure  without any symptoms or hemodynamic perturbation.   Post procedure, the sheath was used to perform angiography of the femoral.  Angio-Seal arteriotomy closure was performed with good hemostasis.  The case  was terminated.   RESULTS:  1. Hemodynamic data:      a.     Aortic pressure 129/76.  b.     Left ventricular pressure 128/4.  2. Left ventriculography:  Left ventricular cavity size and function are      normal.  No mitral regurgitation was noted.  3. Coronary angiography.      a.     Left main coronary:  Mild distal narrowing of up to 25%.      b.     Left anterior descending coronary:  There is ostial 30%       narrowing.  The LAD is a large vessel that gives origin to a very       large first diagonal.  The LAD does wrap around the left ventricular       apex.  The first diagonal contains ostial 50% narrowing and mid 50%       narrowing.  The LAD in its mid segment beyond the diagonal origin       contains a 90+ percent stenosis that is very focal, suggesting the       presence of focal plaque rupture.      c.     Circumflex artery:  Circumflex is a large vessel that gives       origin to a large branching first obtuse marginal and a smaller second       obtuse marginal.  There  is eccentric proximal narrowing before the       first obtuse marginal that obstructs the vessel by 50-70%.  The       continuation of the circumflex was free of any significant       obstruction.  The branch of the first obtuse marginal contains 50%       narrowing.      d.     Right coronary:  Totally occluded proximally.  There are       collaterals to the distal right coronary from the circumflex, left       atrial recurrent branches also via the septal perforators from the       LAD.  4. Percutaneous coronary intervention:  LAD 90% stenosis was reduced to 0%      with TIMI grade 3 flow after stenting as mentioned above with a 16-mm      long x 2.75-mm Taxus.   CONCLUSION:  1. Significant coronary artery disease with total occlusion of the right      coronary depending on collaterals from the left circumflex      predominately and also the left anterior descending artery.  There is      90% focal mid left anterior descending  artery stenosis and 50-70%      mid/proximal circumflex stenosis.  2. Successful stenting of the mid left anterior descending artery lesion      from 90 to 0% with TIMI grade 3 flow.  3. Normal LV function.   PLAN:  Aspirin and Plavix for a year.  Close observation of the circumflex  lesion.  Would recommend nuclear scintigraphy 6-12 months post this  procedure.  Repeat catheterization if recurrent angina.      Travis [name], M.D.  Electronically Signed     HWS/MEDQ  D:  09/17/2005  T:  09/18/2005  Job:  [id number]   cc:   Molly Maduro L. Foy Guadalajara, M.D.

## 2010-05-22 NOTE — Letter (Signed)
August 10, 2006   Dyke Maes, M.D.  [address]  [place], [place] [address]   Re:  Travis [name], Travis [name]              DOB:  11-Jul-1940   Dear Travis [name]:   I saw Travis [name] back in the office today for followup after his seed  implantation.  We did a CT scan, particularly looking at the right lower  lobe nodule and it was still a 5 mm lesion.  On the left side where we  did the seed implantation, there is no evidence of recurrence of his  cancer.   I will plan to see him back again in January, when Dr. Lajuana Matte  has another CT scan scheduled.   PHYSICAL EXAMINATION:  VITAL SIGNS:  His blood pressure was 152/89,  pulse 67, respirations 18, saturation 94%.  LUNGS:  Clear to auscultation and percussion.   Ines Bloomer, M.D.  Electronically Signed   DPB/MEDQ  D:  08/10/2006  T:  08/10/2006  Job:  [id number]

## 2010-05-22 NOTE — Cardiovascular Report (Signed)
[name], [name]              ACCOUNT NO.:  [id number]   MEDICAL RECORD NO.:  [id number]          PATIENT TYPE:  INP   LOCATION:  3701                         FACILITY:  [facility]   PHYSICIAN:  Corky Crafts, MDDATE OF BIRTH:  1940-11-06   DATE OF PROCEDURE:  02/09/2006  DATE OF DISCHARGE:                            CARDIAC CATHETERIZATION   REFERRING PHYSICIAN:  Armanda Magic, M.D.   PROCEDURES PERFORMED:  PCI of the LAD, IVUS of the LAD.   OPERATOR:  Dr. Eldridge Dace   INDICATIONS:  Unstable angina.   PROCEDURE:  The diagnostic catheterization was performed by Dr. Mayford Knife  and this showed a 99% stenosis in the mid LAD with TIMI 2 flow in the  remainder of the vessel. A CLS 4.0 guiding catheter was used to engage  the ostium of the left main. A Prowater wire was advanced down the  vessel across the stenosis. A 2.5 x 12-mm Maverick was then inflated to  10 atmospheres for 32 seconds across the lesion.  A 2.75 x 12 mm Taxus  stent was then deployed across the lesion in overlapping fashion to the  prior stent which had been placed 6 months ago. This new stent was  inflated to 14 atmospheres for 41 seconds. An IVUS was then performed of  the entire stented area including both stents.  This showed under  deployment of the more distal stent with some mild in-stent restenosis.  A 3.0 x 12 mm Quantum was then placed within the stented segment  partially within the distal stent in the restenotic area.  The balloon  was inflated to 16 atmospheres for 30 seconds.  The balloon was then  withdrawn proximally and inflated to 22 atmospheres for 30 seconds. A  repeat IVUS was performed, both stents appeared well deployed.  The  lumen size was improved throughout the entire stented segment. There is  TIMI III flow with no residual stenosis.   IMPRESSION:  1. Successful DES placement to the mid LAD with a 2.75 x 12 mm Taxus      stent overlapping the prior stent.  The new stent was post  dilated      to 3.25 mm approximately and a portion of the more distal stent was      also post dilated to greater than 3 mm.  2. IVUS postprocedure showed well deployed stents.   RECOMMENDATIONS:  Continue aspirin and Plavix indefinitely. Angiomax was  used for anticoagulation.  The sheath will be pulled using manual  compression.      Corky Crafts, MD  Electronically Signed     JSV/MEDQ  D:  02/09/2006  T:  02/09/2006  Job:  [id number]

## 2010-05-22 NOTE — Op Note (Signed)
NAMEJEVON, Travis [name]              ACCOUNT NO.:  [id number]   MEDICAL RECORD NO.:  [id number]          PATIENT TYPE:  INP   LOCATION:  2550                         FACILITY:  [facility]   PHYSICIAN:  Ines Bloomer, M.D. DATE OF BIRTH:  1940-11-30   DATE OF PROCEDURE:  11/09/2005  DATE OF DISCHARGE:                                 OPERATIVE REPORT   PREOPERATIVE DIAGNOSIS:  Left upper lobe lesion.   POSTOPERATIVE DIAGNOSIS:  Non-cell cancer, left upper lobe.   OPERATION PERFORMED:  Left video-assisted thoracoscopy mini thoracotomy  wedge left upper lobe lesion with seed implantation and node sampling.   SURGEON:  Dr. Patricia Nettle. Burney.   General anesthesia.   Shonna Chock, P.A.-C., is an Geophysicist/field seismologist.   After percutaneous insertion of monitoring lines, the patient was turned to  the left lateral thoracotomy position and was prepped and draped in the  usual sterile manner.  Two trocar sites were made in the anterior and the  midaxillary line at the 7th intercostal space.  Two trocars were inserted.  The 0-degree scope was inserted, and the lesion was seen in the posterior  segment of the left upper lobe.  A 5- to 6-cm mini thoracotomy was made over  the triangle of auscultation.  The __________ was divided.  The serratus was  reflected anteriorly.  The 5th intercostal space was entered.  A small TPA  was placed in the intercostal space.  The lesion was palpated, marked and  grasped with a Duvall lung clamp and then resected with four applications of  the EZ45 stapler.  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  CoSeal was applied to the staple lines.  We then biopsied two 10-Rs and a  five and two 11R nodes, taking the 10-Rs from around the bronchus anteriorly  and posteriorly and five from the aortopulmonary window and the 11 nodes  from near the fissure.  Then, the mesh was placed on the table, and care was  taken to keep it as far away from it as possible.  We then  sutured the mesh  to the lung with interrupted 3-0 Vicryl sutures, suturing first to the  staple line superiorly, inferiorly and in the middle and then the superior  and inferior sutures in the mesh.  Lung was re-expanded.  No air leak was  seen.  Two chest tubes were brought into the trocar sites and tied in place  with 0 silk.  Marcaine block was done in the usual fashion.  The On-Q  catheter was placed subpleurally in the usual fashion.  This was a single On-  Q.  Two drill holes were placed in the rib, and the chest was closed with 2  pericostals, #1 Vicryl in the muscle, 2-0 Vicryl in the subcutaneous tissue  and Ethicon skin clips.  The patient returned to the recovery room in stable  condition.           ______________________________  Ines Bloomer, M.D.    DPB/MEDQ  D:  11/09/2005  T:  11/09/2005  Job:  [id number]   cc:   Artist Pais  Tammi Klippel, M.D.

## 2010-05-22 NOTE — Discharge Summary (Signed)
Travis [name], Travis [name]              ACCOUNT NO.:  [id number]   MEDICAL RECORD NO.:  [id number]          PATIENT TYPE:  INP   LOCATION:  6525                         FACILITY:  [facility]   PHYSICIAN:  Armanda Magic, M.D.     DATE OF BIRTH:  January 15, 1940   DATE OF ADMISSION:  02/08/2006  DATE OF DISCHARGE:  02/10/2006                               DISCHARGE SUMMARY   DISCHARGE DIAGNOSES:  1. Coronary artery disease status post drug-eluting stent to mid left      anterior descending artery.  2. History of elevated glucose.  3. History of atrial fibrillation.  4. Former tobacco abuse.  5. Family history of coronary artery disease.  6. Hyperlipidemia, treated.  7. Insomnia, treated.   HISTORY:  Travis [name] is a 70 year old male patient with a known history  of coronary artery disease who underwent arterectomy in 1994 of the  right coronary artery.  In 1999 he had a right cardiac catheterization  which showed an occluded right coronary artery with left to right  collaterals.  This study also showed a 90% LAD lesion and a Taxus stent  was placed.  He was admitted on February 08, 2006, after complaining of  progressive angina for three weeks.   Cardiac catheterization on February 09, 2006, showed a 99% mid LAD  stenosis.  Dr. Eldridge Dace then successfully deployed a drug-eluting stent  to the mid LAD lesion (Taxus) which overlapped the prior stent.  The  patient tolerated the procedure well and was ready for discharge to home  the following day.   LABORATORY STUDIES:  Showed a BUN of 12, creatinine of 1.14, potassium  3.8, hemoglobin 13.6, hematocrit of 40.4.   The patient was discharged to home in stable condition, and also had the  recommendations of long term aspirin and Plavix.   DISCHARGE MEDICATIONS:  1. Plavix 75 mg a day.  2. Enteric-coated aspirin 325 mg a day.  3. Toprol XL 25 mg a day.  4. Protonix 40 mg a day.  5. Zocor 20 mg a day.  6. Ambien nightly p.r.n.  7. Sublingual  nitroglycerin p.r.n. chest pain.   Increasing activity slowly.  No lifting over 10 pounds for one week.  No  driving for two days.  Clean cath site gently with soap and water.  Follow up with Dr. Mayford Knife on February 25, 2006, at 2:45 p.m.   The patient was discharged home in stable condition.      Guy Franco, P.A.      Armanda Magic, M.D.  Electronically Signed    LB/MEDQ  D:  02/10/2006  T:  02/11/2006  Job:  [id number]   cc:   Dr. Foy Guadalajara

## 2010-05-22 NOTE — Assessment & Plan Note (Signed)
[facility]                               PULMONARY OFFICE NOTE   MALIK, PAAR                     MRN:          [id number]  DATE:10/05/2005                            DOB:          15-Jan-1940    CHIEF COMPLAINT:  Evaluate lung nodule.   HISTORY OF PRESENT ILLNESS:  This 70 year old white male, who as part of his  cardiac evaluation had a chest x-ray, showing abnormality.  A follow-up CT  scan demonstrated the presence of a nodule in the lung.  He is referred now  for further evaluation.  He has no real dyspnea.  He does have some chest  discomfort and irregular heartbeat, some indigestion.  No real cough.  No  hemoptysis, except minimal clear mucus in the morning.  He is not dyspneic.  He smoked for 50 years and still smokes one pack a day.  He is self-  employed.  He has had no previous known lung conditions.  he is referred for  an evaluation of abnormal CT scanning of the chest, which has demonstrated a  1.6 cm solitary left upper lobe nodule, with no other mediastinal  adenopathy.  No other nodules seen.  This is irregular in nature and does  not demonstrate any calcification.   PAST MEDICAL HISTORY:  1. History of angina with previous angioplasty in 2000, and a stent in      2007.  History of angina with this.  No history of hypertension.  Does      have a history of heart dysarrhythmias.  2. 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  No history of      strokes, blood clots or cancer.  3. History of sleep apnea and is on CPAP therapy.   PAST SURGICAL HISTORY:  1. Angioplasty and stent.  2. He did have an exploratory laparotomy 12 years ago after trauma.   MEDICATION ALLERGIES:  No known drug allergies.   CURRENT MEDICATIONS:  1. Plavix 75 mg daily.  2. Protonix 40 mg daily.  3. Simvastatin 20 mg daily.  4. Metoprolol 25 mg daily.  5. Aspirin 325 mg daily.  6. Ambien 10 mg q.h.s.   SOCIAL HISTORY:  The patient  smokes one pack a day, as noted above.  He is  self-employed.  He lives with his wife.   FAMILY HISTORY:  Heart disease in his mother and father.  Two brothers had  neck and throat cancer.  A sister had breast cancer.   REVIEW OF SYSTEMS:  Otherwise noncontributory.   PHYSICAL EXAMINATION:  GENERAL:  A middle-aged obese male, in no distress.  VITAL SIGNS:  Temperature 97 degrees, blood pressure 120/82, pulse 64,  saturation 96% on room air.  CHEST:  Distant breath sounds, a few expiratory squeaks.  HEART:  A regular rate and rhythm without S3.  Normal S1 and S2.  ABDOMEN:  Soft, nontender.  EXTREMITIES:  Showed no edema or clubbing.  SKIN:  Clear.  NEUROLOGIC:  Intact.  HEENT/NECK:  No jugular venous distention, no lymphadenopathy.  Oropharynx  clear.  The neck is supple.  LABORATORY DATA:  A CT scan of the chest was obtained and reviewed from  September 23, 2005, revealing a 1.6 cm left upper lobe solitary nodule  without evidence of mediastinal adenopathy.  No other abnormalities seen.   An echocardiogram from August 27, 2004, showed essentially normal left  ventricular function.   IMPRESSION:  Solitary nodule, left upper lobe, in a patient with heavy  smoking.  This is a malignancy until proven otherwise.   RECOMMENDATIONS:  Refer to Dr. Viviann Spare C. Hendrickson for a resection of this  lesion.  Also pursue full pulmonary function studies and a PTE scan.       Charlcie Cradle Delford Field, MD, [facility]      PEW/MedQ  DD:  10/06/2005  DT:  10/07/2005  Job #:  [id number]   cc:   Armanda Magic, M.D.  Salvatore Decent Dorris Fetch, M.D.

## 2010-05-22 NOTE — Discharge Summary (Signed)
NAMEBRADLEY, BOSTELMAN              ACCOUNT NO.:  [id number]   MEDICAL RECORD NO.:  [id number]          PATIENT TYPE:  INP   LOCATION:  2001                         FACILITY:  [facility]   PHYSICIAN:  Ines Bloomer, M.D. DATE OF BIRTH:  04-23-1940   DATE OF ADMISSION:  11/09/2005  DATE OF DISCHARGE:                                 DISCHARGE SUMMARY   ADMISSION DIAGNOSIS:  Left upper lobe pneumonia.   DISCHARGE DIAGNOSES:  1. Left upper lobe pneumonia with left upper lobe wedge resection with      __________  , (squamous cell pT1c, pN0, pMX).  2. 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  3. History of obstructive sleep apnea and needs continuous positive airway      pressure.  4. Coronary artery disease, history of angioplasty in 2000 and stenting in      September 2007.  5. Dyspepsia.  6. Ongoing tobacco abuse.  7. Hypercholesterolemia.  8. History of exploratory laparotomy 12 years ago after trauma.   PROCEDURES:  November 09, 2005, video-assisted thoracic surgery with mini-  thoracotomy for left upper lobe wedge resection and seed implantation with  lymph node sampling by Dr. Norton Blizzard.   HISTORY:  Mr. Travis [name] is a 70 year old Caucasian male with a long history of  smoking.  He has a history of coronary artery disease as discussed above.  He has also been treated for dyspepsia and had workup and during workup for  Staph, he was found to have a left upper lobe lesion.  Her has no known  hemoptysis, cough, excessive sputum, fever or chills.  He smoked at least 1  pack of cigarettes per day for the past 50 years.  PET scan was done which  confirmed a 1.6-mm nostril in the left upper lobe which was positive and a  standard uptake at 7.8.  Pulmonary function tests showed an FVC of 4.48 and  an FEV-1 of 3.49 and a DLCO of 100.  He was initially seen by Dr. Shan Levans, who recommended that he be seen by a thoracic surgeon for surgical  treatment.  He was  seen by Dr. Norton Blizzard, who recommended left upper  lobe wedge resection.  He was also felt to be a good candidate for  radioactive seed implantation and was seen by Dr. Margaretmary Dys.  After  discussing risks, benefits and alternatives, the patient agreed to proceed.   HOSPITAL COURSE:  Mr. Hanawalt was electively admitted to [facility]  on November 09, 2005.  He went left upper lobe wedge with radioactive seed  implantation as discussed above.  He was extubated, neurologically intact  and later transferred to step-down on 3200.  At the time of this dictation,  his postoperative course has been relatively uneventful.  He did require  some supplemental oxygen initially, but this has since been weaned.  He also  has had some intermittent temperatures up to 101 which were most likely to  be due to atelectasis and aggressive pulmonary toilet was encouraged.  His  white count had peaked at 12,000, but it  appears to be trending down.  There  is no obvious sign of infection.  .  Further workup will be considered if  fevers progress.  Mr. Mathew had his first chest tube removed on postop day  #1 and his remaining chest tube on postoperative day #3.  Followup chest x-  ray showed bibasilar atelectasis, which remained stable with no  pneumothorax.  Final pathology showed squamous cell carcinoma, as discussed  above.  Mr. Fendley postoperative pain was initially controlled with  morphine PCA and was later transitioned to oral pain medication.  He has  been able to void following removal of his Foley catheter.  He has been  progressing with mobility.  His incision appeared to be healing well without  signs of infection.  His postoperative labs have been stable, showing a  white blood count of 11.6, hemoglobin 11.3, hematocrit 33.2, platelet count  160,000, sodium 136, potassium 4.3, chloride 101, CO2 31, blood glucose 123,  BUN 10, creatinine 1.0, total bilirubin 1.2, alkaline  phosphatase 52, AST  36, ALT 25, total protein 5.2, albumin 2.7 and calcium 8.0.  He has remained  in normal sinus rhythm with a heart rate in the 80s.  Blood pressure has  been stable at less than 120/60.  Oxygen saturation has been on room air at  96%.  Mr. Manasco has made steady progress and remains afebrile.  If followup  chest x-ray remains stable, it is considered he will be ready for discharge  home on postoperative day #4, November 13, 2005.   DISCHARGE MEDICATIONS:  1. Percocet 5/325 one to two tablets p.o. q.4 h. p.r.n. for pain.  2. Plavix 75 mg p.o. daily.  3. Protonix 40 mg p.o. daily.  4. Simvastatin 25 mg p.o. daily.  5. Toprol 25 mg p.o. daily.  6. Aspirin 325 mg p.o. daily.  7. Ambien per home dose p.r.n. for sleep.   DISCHARGE INSTRUCTIONS:  He is to continue a low-fat, low-salt diet.  He may  shower and clean his incisions gently with soap and water.  He should notify  the [facility] office if he develops fever greater than 101 or redness or drainage  from his incision site, or increasing shortness of breath.  He is to void by  driving or heavy lifting for the next 2-3 weeks.  He is to continue daily  walking and breathing exercises.  He is encouraged to avoid smoking and is  encouraged to enroll in a smoking cessation class at [phone] if desired.   FOLLOWUP:  He is to be followed by Dr. Edwyna Shell at the [facility] office on November 23, 2005 at 3:30, again with a chest x-ray 1 hour for appointment at  [facility].  He should follow up with Dr. Kathrynn Running as directed.  This will be discussed further at his followup appointment with Dr. Edwyna Shell.      Jerold Coombe, P.A.    ______________________________  Ines Bloomer, M.D.    AWZ/MEDQ  D:  11/12/2005  T:  11/13/2005  Job:  [id number]   cc:   Artist Pais Kathrynn Running, M.D.  Charlcie Cradle Delford Field, MD, FCCP  Robert L. Foy Guadalajara, M.D.

## 2010-05-22 NOTE — H&P (Signed)
Travis [name]              ACCOUNT NO.:  [id number]   MEDICAL RECORD NO.:  [id number]          PATIENT TYPE:  INP   LOCATION:  3701                         FACILITY:  [facility]   PHYSICIAN:  Armanda Magic, M.D.     DATE OF BIRTH:  May 22, 1940   DATE OF ADMISSION:  02/08/2006  DATE OF DISCHARGE:                              HISTORY & PHYSICAL   REASON FOR ADMISSION:  Chest pain.   Travis [name] is a 70 year old male patient who has had 3 episodes of  recurrent chest pain over the past 2-3 weeks.  He describes these as  midsternal in nature, radiating to the jaw.  These episodes were  accompanied by fatigue.   He has a history of atherectomy of the right coronary artery in 1994.  A  repeat cardiac catheterization in 1999 demonstrated total occlusion of  the RCA with left-to-right collaterals.  A TAXUS stent was placed to the  LAD lesion.  Since that time the patient had done well up until recently  as described above.   CURRENT MEDICATIONS:  1. Ambien p.r.n.  2. Toprol XL 25 mg a day.  3. Baby aspirin daily.  4. Protonix 40 mg a day.  5. Zocor 20 mg a day.  6. Plavix 75 mg a day.   ALLERGIES:  No known drug allergies.   FAMILY HISTORY:  Dad died at age 58.  He had an MI at age 91 and then  died of a massive MI at age 63.  He had a history of smoking, diabetes  mellitus, as well as hypertension.  Mom died at age 37 of an MI. She  also had hypertension.   SOCIAL HISTORY:  Married, has 3 children.  Former smoker, no alcohol or  illicit drug use.   PHYSICAL EXAMINATION:  VITAL SIGNS: Weight 234.  Pulse 64, blood  pressure 136/84.  HEAD, EYES, EARS, NOSE, THROAT, NECK: No carotid or subclavian bruits,  no JVD, no thyromegaly.  Sclerae clear.  Conjunctivae normal.  Nares  without drainage.  CHEST:  Clear to auscultation bilaterally without wheeze or rhonchi.  HEART: Regular rate and rhythm.  No murmur.  ABDOMEN:  Benign.  Good bowel sounds.  Nontender, nondistended.  No  masses, no bruits.  EXTREMITIES:  No lower extremity edema.  SKIN: Warm and dry.   ASSESSMENT AND PLAN:  1. Chest pain.  2. Known coronary artery disease.  3. Sleep apnea.  4. Insomnia.  5. Paroxysmal atrial fibrillation.  6. Family history of coronary artery disease.  7. Hyperlipidemia.   The patient has been seen and examined by Dr. Armanda Magic.  The patient  will be admitted to  [facility] and undergo cardiac catheterization tomorrow.  Orders have  been written.      Guy Franco, P.A.      Armanda Magic, M.D.  Electronically Signed    LB/MEDQ  D:  02/08/2006  T:  02/08/2006  Job:  [id number]   cc:   Travis [name], M.D.

## 2010-05-22 NOTE — H&P (Signed)
NAMEHARVARD, Travis [name]              ACCOUNT NO.:  [id number]   MEDICAL RECORD NO.:  [id number]          PATIENT TYPE:  INP   LOCATION:  NA                           FACILITY:  [facility]   PHYSICIAN:  Ines Bloomer, M.D. DATE OF BIRTH:  12-30-1940   DATE OF ADMISSION:  DATE OF DISCHARGE:                                HISTORY & PHYSICAL   CHIEF COMPLAINT:  Left upper lobe nodule.   HISTORY OF PRESENT ILLNESS:  This 70 year old patient has long history of  smoking.  He had an angioplasty in 2000 and had a stent placed two to three  weeks ago for angina.  He has also been treated for dyspepsia, and on workup  for staph, he was found to have left upper lobe lesion.  He has a known  hemoptysis, cough, excessive sputum, fever, chills.  He smokes at least one  pack a day.  Has at least a 50-pack-year history.  The PET scan was done for  this 1.6-mm nodule and was positive with a standard uptake value of 7.8.  Pulmonary function tests showed a FVC of 4.48 with an FEV1 of 3.49 and a  DLCO of 100.   PAST MEDICAL HISTORY:  He has no allergies.  He has diabetes type 2.  He has  a history of sleep apnea.  He is on CPAP therapy.  Had an exploratory lap 12  years ago.   CURRENT MEDICATIONS:  1. He is on Plavix 75 mg a day.  2. Protonix 1 mg a day.  3. Simvastatin 20 mg a day.  4. Toprol 25 mg a day.  5. Aspirin.  6. Ambien.   FAMILY HISTORY:  Positive for cardiac disease.  His two brothers had a head  and neck cancer and a sister with breast cancer.   SOCIAL HISTORY:  He is married and has three children.  He smokes a pack of  cigarettes a day.  Does not drink alcohol on a regular basis.   REVIEW OF SYSTEMS:  He is 222 pounds.  He is 5 feet 10 inches.  Cardiac:  See history of present illness.  Has a history of palpitations and  arrhythmias.  Pulmonary:  He has chronic wheezing; see history of present  illness.  GI:  He has been treated with dyspepsia and reflux.  GU:  No  dysuria and  frequent urination.  No kidney disease.  Vascular:  No  claudication, DVT, or TIAs.  Does have some pain in his feet.  Neurological:  No headaches, blackouts, or seizures.  Psychiatric:  He has been treated for  nervousness.  Musculoskeletal:  No problems with major joints, feet, or  muscular pain.  ENT:  No change in his eyesight or hearing.  Hematologic:  No problems with anemia.   PHYSICAL EXAMINATION:  GENERAL:  He is a well-developed Caucasian male in no  acute distress.  VITAL SIGNS:  His blood pressure is 124/70.  Pulse is 66.  Respirations 18.  Sats are 95%.  HEENT:  Head:  Atraumatic.  Eyes:  Pupils are equal, reactive to light and  accommodation.  Extraocular movements are normal.  Ears:  Tympanic membranes  are intact.  Nose:  There is no septal deviation.  Mouth:  Without lesions.  NECK:  Supple, without thyromegaly.  No supraclavicular or axillary  adenopathy.  No carotid bruits.  CHEST:  Clear to auscultation and percussion.  HEART:  Regular sinus rhythm.  No murmurs.  ABDOMEN:  Soft.  There is no hepatosplenomegaly.  EXTREMITIES:  Pulses are 2+.  There is no clubbing or edema.  NEUROLOGIC:  He is oriented x3.  Sensory and motor intact.  Cranial nerves  are intact.  SKIN:  Without lesions.   IMPRESSION:  1. Left upper lobe lesion.  2. History of tobacco abuse.  3. Coronary artery disease, status post stent placement.  4. Diabetes type 2.  5. History of sleep apnea.   PLAN:  Left VATS wedge resection of left upper lobe lesion with seed  implantation.           ______________________________  Ines Bloomer, M.D.     DPB/MEDQ  D:  11/07/2005  T:  11/08/2005  Job:  [id number]

## 2010-06-24 ENCOUNTER — Other Ambulatory Visit: Payer: Self-pay | Admitting: Thoracic Surgery

## 2010-06-24 DIAGNOSIS — D381 Neoplasm of uncertain behavior of trachea, bronchus and lung: Secondary | ICD-10-CM

## 2010-06-24 DIAGNOSIS — R911 Solitary pulmonary nodule: Secondary | ICD-10-CM

## 2010-07-15 ENCOUNTER — Ambulatory Visit (INDEPENDENT_AMBULATORY_CARE_PROVIDER_SITE_OTHER): Payer: Medicare Other | Admitting: Thoracic Surgery

## 2010-07-15 ENCOUNTER — Ambulatory Visit
Admission: RE | Admit: 2010-07-15 | Discharge: 2010-07-15 | Disposition: A | Discharge status: Home / Self Care | Payer: Medicare Other | Source: Ambulatory Visit | Attending: Thoracic Surgery | Admitting: Thoracic Surgery

## 2010-07-15 DIAGNOSIS — C3490 Malignant neoplasm of unspecified part of unspecified bronchus or lung: Secondary | ICD-10-CM

## 2010-07-15 DIAGNOSIS — R911 Solitary pulmonary nodule: Secondary | ICD-10-CM

## 2010-07-15 DIAGNOSIS — D381 Neoplasm of uncertain behavior of trachea, bronchus and lung: Secondary | ICD-10-CM

## 2010-07-16 NOTE — Letter (Signed)
July 15, 2010  Dr. Dewaine Oats  Re:  Travis [name], Travis [name]              DOB:  [birth year]  Dear Dr. Foy Guadalajara:  The patient comes in today, now 5 years since we did a wedge resection with seeds to the left upper lobe squamous cell cancer and the CT scan today showed no evidence of recurrence.  He is doing well overall and I will release him back to you for a long-term follow up.  I recommend that he gets at least a chest x-ray once a year, although the new recommendations in the future may be a low-dose CT scan once a year.  At least he should have a chest x-ray once a year looking for a second primary.  His blood pressure is 121/72, pulse 64, respirations 20, sats are 95%.  Lungs are clear to auscultation and percussion.  Sincerely,  Ines Bloomer, [name].D. Electronically Signed  DPB/MEDQ  D:  07/15/2010  T:  07/15/2010  Job:  [id number]  cc:   Lajuana Matte, [name].D. Armanda Magic, [name].D.

## 2010-09-16 ENCOUNTER — Encounter: Payer: Self-pay | Admitting: Critical Care Medicine

## 2010-09-16 ENCOUNTER — Ambulatory Visit (HOSPITAL_COMMUNITY)
Admission: RE | Admit: 2010-09-16 | Discharge: 2010-09-16 | Disposition: A | Discharge status: Home / Self Care | Payer: Medicare Other | Source: Ambulatory Visit | Attending: Cardiology | Admitting: Cardiology

## 2010-09-16 ENCOUNTER — Ambulatory Visit (INDEPENDENT_AMBULATORY_CARE_PROVIDER_SITE_OTHER): Payer: Medicare Other | Admitting: Critical Care Medicine

## 2010-09-16 DIAGNOSIS — I480 Paroxysmal atrial fibrillation: Secondary | ICD-10-CM | POA: Insufficient documentation

## 2010-09-16 DIAGNOSIS — R059 Cough, unspecified: Secondary | ICD-10-CM | POA: Insufficient documentation

## 2010-09-16 DIAGNOSIS — J449 Chronic obstructive pulmonary disease, unspecified: Secondary | ICD-10-CM

## 2010-09-16 DIAGNOSIS — G4733 Obstructive sleep apnea (adult) (pediatric): Secondary | ICD-10-CM

## 2010-09-16 DIAGNOSIS — F17200 Nicotine dependence, unspecified, uncomplicated: Secondary | ICD-10-CM | POA: Insufficient documentation

## 2010-09-16 DIAGNOSIS — I4891 Unspecified atrial fibrillation: Secondary | ICD-10-CM

## 2010-09-16 DIAGNOSIS — R05 Cough: Secondary | ICD-10-CM | POA: Insufficient documentation

## 2010-09-16 DIAGNOSIS — E785 Hyperlipidemia, unspecified: Secondary | ICD-10-CM

## 2010-09-16 DIAGNOSIS — I10 Essential (primary) hypertension: Secondary | ICD-10-CM | POA: Insufficient documentation

## 2010-09-16 DIAGNOSIS — F17210 Nicotine dependence, cigarettes, uncomplicated: Secondary | ICD-10-CM | POA: Insufficient documentation

## 2010-09-16 DIAGNOSIS — R0989 Other specified symptoms and signs involving the circulatory and respiratory systems: Secondary | ICD-10-CM | POA: Insufficient documentation

## 2010-09-16 DIAGNOSIS — R0609 Other forms of dyspnea: Secondary | ICD-10-CM | POA: Insufficient documentation

## 2010-09-16 DIAGNOSIS — C3490 Malignant neoplasm of unspecified part of unspecified bronchus or lung: Secondary | ICD-10-CM

## 2010-09-16 DIAGNOSIS — J988 Other specified respiratory disorders: Secondary | ICD-10-CM | POA: Insufficient documentation

## 2010-09-16 DIAGNOSIS — R0602 Shortness of breath: Secondary | ICD-10-CM | POA: Insufficient documentation

## 2010-09-16 LAB — PULMONARY FUNCTION TEST

## 2010-09-16 MED ORDER — AZITHROMYCIN 250 MG PO TABS: ORAL_TABLET | ORAL | Status: DC

## 2010-09-16 MED ORDER — PREDNISONE 10 MG PO TABS: 10.0000 mg | ORAL_TABLET | Freq: Every day | ORAL | Status: DC

## 2010-09-16 MED ORDER — MOMETASONE FURO-FORMOTEROL FUM 100-5 MCG/ACT IN AERO: 2.0000 | INHALATION_SPRAY | Freq: Two times a day (BID) | RESPIRATORY_TRACT | Status: DC

## 2010-09-16 NOTE — Patient Instructions (Signed)
Dulera two puffs twice daily Prednisone 10mg  Take 4 for two days three for two days two for two days one for two days  Azithromycin 250mg  Take two once then one daily until gone Stop smoking with NicodermCQ and nicorette minis Return 2 months

## 2010-09-16 NOTE — Assessment & Plan Note (Addendum)
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

## 2010-09-16 NOTE — Assessment & Plan Note (Signed)
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

## 2010-09-16 NOTE — Progress Notes (Signed)
Subjective:    Patient ID: Travis [name], male    DOB: Oct 12, 1940, 70 y.o.   MRN: [id number]  Shortness of Breath This is a chronic problem. The current episode started more than 1 month ago. The problem occurs daily (exertion only, not at rest.  Up hill, stairs.). The problem has been unchanged. Duration: recovers in a minute. Associated symptoms include sputum production and wheezing. Pertinent negatives include no abdominal pain, chest pain, claudication, ear pain, fever, headaches, hemoptysis, leg pain, leg swelling, neck pain, orthopnea, PND, rash, rhinorrhea, sore throat, syncope or vomiting. The symptoms are aggravated by any activity, weather changes, exercise and smoke (temperature changes ). Associated symptoms comments: Cough is prod of white. Risk factors include smoking. He has tried beta agonist inhalers and steroid inhalers (dulera 100  ) for the symptoms. The treatment provided no relief. His past medical history is significant for CAD. There is no history of allergies, asthma, bronchiolitis, chronic lung disease, COPD, DVT, a heart failure, PE or pneumonia. (Lung cancer with lobectomy LUL  2007)   Past Medical History  Diagnosis Date  . PAF (paroxysmal atrial fibrillation)   . Hypertension   . Dyslipidemia   . OSA (obstructive sleep apnea)   . Psoriasis   . Insomnia   . Lung cancer     s/p lobectomy, chemo and radiation therapy  . CAD (coronary artery disease)     s/p RCA atherectomy 1994     Family History  Problem Relation Age of Onset  . Heart attack Father   . Coronary artery disease Brother   . Lymphoma Brother   . Breast cancer Sister   . Diabetes Sister   . Heart disease Sister      History   Social History  . Marital Status: Married    Spouse Name: N/A    Number of Children: 3  . 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 Smoking status: Current Everyday Smoker -- 1.0 packs/day for 50 years   Types: Cigarettes  . Smokeless tobacco: Former Neurosurgeon    Types: Chew    Quit date: 01/04/2005   Comment: started smoking at age 54.  Marland Kitchen Alcohol Use: Not on file  . Drug Use: Not on file  . Sexually Active: Not on file   Other Topics Concern  . Not on file   Social History Narrative  . No narrative on file     No Known Allergies   Outpatient Prescriptions Prior to Visit  Medication Sig Dispense Refill  . aspirin 81 MG tablet Take 81 mg by mouth daily.        . dabigatran (PRADAXA) 150 MG CAPS Take 150 mg by mouth every 12 (twelve) hours.        . dronedarone (MULTAQ) 400 MG tablet Take 400 mg by mouth 2 (two) times daily.        Marland Kitchen omega-3 acid ethyl esters (LOVAZA) 1 G capsule Take 2 g by mouth daily.        . pantoprazole (PROTONIX) 40 MG tablet Take 40 mg by mouth daily.        . rosuvastatin (CRESTOR) 20 MG tablet Take 20 mg by mouth daily.        Marland Kitchen zolpidem (AMBIEN) 10 MG tablet Take 10 mg by mouth at bedtime as needed.             Review of Systems  Constitutional: Positive  for appetite change and fatigue. Negative for fever, chills, diaphoresis, activity change and unexpected weight change.  HENT: Positive for congestion, sneezing, trouble swallowing, postnasal drip and sinus pressure. Negative for hearing loss, ear pain, nosebleeds, sore throat, facial swelling, rhinorrhea, mouth sores, neck pain, neck stiffness, dental problem, voice change, tinnitus and ear discharge.   Eyes: Negative for photophobia, discharge, itching and visual disturbance.  Respiratory: Positive for cough, sputum production, shortness of breath and wheezing. Negative for apnea, hemoptysis, choking, chest tightness and stridor.   Cardiovascular: Negative for chest pain, palpitations, orthopnea, claudication, leg swelling, syncope and PND.  Gastrointestinal: Negative for nausea, vomiting, abdominal pain, constipation, blood in stool and abdominal distention.  Genitourinary: Negative for dysuria, urgency,  frequency, hematuria, flank pain, decreased urine volume and difficulty urinating.  Musculoskeletal: Positive for back pain. Negative for myalgias, joint swelling, arthralgias and gait problem.  Skin: Negative for color change, pallor and rash.  Neurological: Positive for tremors. Negative for dizziness, seizures, syncope, speech difficulty, weakness, light-headedness, numbness and headaches.  Hematological: Negative for adenopathy. Does not bruise/bleed easily.  Psychiatric/Behavioral: Positive for sleep disturbance and agitation. Negative for confusion. The patient is nervous/anxious.        Objective:   Physical Exam  Filed Vitals:   09/16/10 1034  BP: 134/80  Pulse: 55  Temp: 98.1 F (36.7 C)  TempSrc: Oral  Height: 5\' 11"  (1.803 m)  Weight: 223 lb 12.8 oz (101.515 kg)  SpO2: 96%    Gen: Pleasant, well-nourished, in no distress,  normal affect  ENT: No lesions,  mouth clear,  oropharynx clear, no postnasal drip  Neck: No JVD, no TMG, no carotid bruits  Lungs: No use of accessory muscles, no dullness to percussion, distant BS, exp wheeze, scattered rhonchi  Cardiovascular: RRR, heart sounds normal, no murmur or gallops, no peripheral edema  Abdomen: soft and NT, no HSM,  BS normal  Musculoskeletal: No deformities, no cyanosis or clubbing  Neuro: alert, non focal  Skin: Warm, no lesions or rashes   06/24/10 CT Chest IMPRESSION:  Postsurgical changes in the left upper lung. No CT evidence of  recurrent or metastatic disease in the chest.   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 aspirin 81 MG tablet Take 81 mg by mouth daily.        . dabigatran (PRADAXA) 150 MG CAPS Take 150 mg by mouth every 12 (twelve) hours.        . dronedarone (MULTAQ) 400 MG tablet Take 400 mg by mouth 2 (two) times daily.        Marland Kitchen omega-3 acid ethyl esters (LOVAZA) 1 G capsule Take 2 g by mouth daily.        . pantoprazole (PROTONIX) 40 MG tablet Take 40 mg by mouth daily.        Marland Kitchen  rosuvastatin (CRESTOR) 20 MG tablet Take 20 mg by mouth daily.        Marland Kitchen zolpidem (AMBIEN) 10 MG tablet Take 10 mg by mouth at bedtime as needed.        Marland Kitchen azithromycin (ZITHROMAX) 250 MG tablet Take as directed  6 each  0  . mometasone-formoterol (DULERA) 100-5 MCG/ACT AERO Inhale 2 puffs into the lungs 2 (two) times daily.  1 Inhaler  6  . predniSONE (DELTASONE) 10 MG tablet Take 1 tablet (10 mg total) by mouth daily. Take 4 x 2 days 3 for 2days, two for two days , one for two and stop  20 tablet  0

## 2010-10-01 ENCOUNTER — Encounter: Payer: Self-pay | Admitting: Critical Care Medicine

## 2010-10-01 LAB — CARDIAC PANEL(CRET KIN+CKTOT+MB+TROPI)
CK, MB: 5.6 — ABNORMAL HIGH
Relative Index: 3.5 — ABNORMAL HIGH
Relative Index: 3.7 — ABNORMAL HIGH
Total CK: 137
Total CK: 150
Troponin I: 0.04
Troponin I: 0.04

## 2010-10-01 LAB — APTT: aPTT: 28

## 2010-10-01 LAB — DIFFERENTIAL
Lymphocytes Relative: 25
Lymphs Abs: 2.2
Monocytes Absolute: 0.7
Monocytes Relative: 8
Neutro Abs: 5.8

## 2010-10-01 LAB — HEMOGLOBIN A1C: Mean Plasma Glucose: 154

## 2010-10-01 LAB — CBC
HCT: 42.5
MCHC: 33.9
MCV: 85.3
Platelets: 161
RDW: 15

## 2010-10-01 LAB — POCT CARDIAC MARKERS
CKMB, poc: 2.2
Myoglobin, poc: 83.1
Operator id: 234501
Troponin i, poc: <0.05

## 2010-10-01 LAB — COMPREHENSIVE METABOLIC PANEL
Albumin: 3.2 — ABNORMAL LOW
Alkaline Phosphatase: 61
BUN: 17
Calcium: 8.4
Creatinine, Ser: 1.22
Potassium: 3.5
Total Protein: 5.6 — ABNORMAL LOW

## 2010-10-01 LAB — TSH: TSH: 1.146

## 2010-10-01 LAB — LIPID PANEL: VLDL: 24

## 2010-10-01 LAB — B-NATRIURETIC PEPTIDE (CONVERTED LAB): Pro B Natriuretic peptide (BNP): 153 — ABNORMAL HIGH

## 2010-10-21 LAB — CREATININE, SERUM: Creatinine, Ser: 1.21

## 2010-11-25 ENCOUNTER — Ambulatory Visit: Payer: Medicare Other | Admitting: Critical Care Medicine

## 2011-05-12 ENCOUNTER — Ambulatory Visit: Payer: Medicare Other | Admitting: Critical Care Medicine

## 2011-06-16 ENCOUNTER — Ambulatory Visit (INDEPENDENT_AMBULATORY_CARE_PROVIDER_SITE_OTHER): Payer: Medicare Other | Admitting: Critical Care Medicine

## 2011-06-16 ENCOUNTER — Encounter: Payer: Self-pay | Admitting: Critical Care Medicine

## 2011-06-16 VITALS — BP 126/68 | HR 56 | Temp 98.0°F | Ht 71.0 in | Wt 216.6 lb

## 2011-06-16 DIAGNOSIS — J449 Chronic obstructive pulmonary disease, unspecified: Secondary | ICD-10-CM

## 2011-06-16 NOTE — Progress Notes (Signed)
Subjective:    Patient ID: Travis [name], male    DOB: Jul 01, 1940, 71 y.o.   MRN: [id number]  HPI 06/16/2011 Last seen 9/12.  Did not quit smoking 06/03/11.  Since off cigarettes not much cough.  No chest pain. Still dyspneic with exertion. Pt denies any significant sore throat, nasal congestion or excess secretions, fever, chills, sweats, unintended weight loss, pleurtic or exertional chest pain, orthopnea PND, or leg swelling Pt denies any increase in rescue therapy over baseline, denies waking up needing it or having any early am or nocturnal exacerbations of coughing/wheezing/or dyspnea. Pt also denies any obvious fluctuation in symptoms with  weather or environmental change or other alleviating or aggravating factors    Past Medical History  Diagnosis Date  . PAF (paroxysmal atrial fibrillation)   . Hypertension   . Dyslipidemia   . OSA (obstructive sleep apnea)   . Psoriasis   . Insomnia   . Lung cancer     s/p lobectomy, chemo and radiation therapy  . CAD (coronary artery disease)     s/p RCA atherectomy 1994     Family History  Problem Relation Age of Onset  . Heart attack Father   . Coronary artery disease Brother   . Lymphoma Brother   . Breast cancer Sister   . Diabetes Sister   . Heart disease Sister      History   Social History  . Marital Status: Married    Spouse Name: N/A    Number of Children: 3  . 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 Smoking status: Former Smoker -- 1.0 packs/day for 56 years    Types: Cigarettes    Quit date: 05/30/2011  . Smokeless tobacco: Current User    Types: Chew   Comment: started smoking at age 10.  Marland Kitchen Alcohol Use: Not on file  . Drug Use: Not on file  . Sexually Active: Not on file   Other Topics Concern  . Not on file   Social History Narrative  . No narrative on file     No Known Allergies   Outpatient Prescriptions Prior to Visit  Medication  Sig Dispense Refill  . aspirin 81 MG tablet Take 81 mg by mouth daily.        . dabigatran (PRADAXA) 150 MG CAPS Take 150 mg by mouth every 12 (twelve) hours.        . dronedarone (MULTAQ) 400 MG tablet Take 400 mg by mouth 2 (two) times daily.        Marland Kitchen omega-3 acid ethyl esters (LOVAZA) 1 G capsule Take 2 g by mouth daily.        . pantoprazole (PROTONIX) 40 MG tablet Take 40 mg by mouth daily.        . rosuvastatin (CRESTOR) 20 MG tablet Take 20 mg by mouth daily.        Marland Kitchen zolpidem (AMBIEN) 10 MG tablet Take 10 mg by mouth at bedtime as needed.        Marland Kitchen azithromycin (ZITHROMAX) 250 MG tablet Take as directed  6 each  0  . mometasone-formoterol (DULERA) 100-5 MCG/ACT AERO Inhale 2 puffs into the lungs 2 (two) times daily.  1 Inhaler  6  . predniSONE (DELTASONE) 10 MG tablet Take 1 tablet (10 mg total) by mouth daily. Take 4 x 2 days 3 for 2days, two for two days , one for two and  stop  20 tablet  0       Review of Systems  Constitutional: Positive for fatigue. Negative for chills, diaphoresis, activity change, appetite change and unexpected weight change.  HENT: Positive for trouble swallowing and sinus pressure. Negative for hearing loss, nosebleeds, congestion, facial swelling, sneezing, mouth sores, neck stiffness, dental problem, voice change, postnasal drip, tinnitus and ear discharge.   Eyes: Negative for photophobia, discharge, itching and visual disturbance.  Respiratory: Positive for cough. Negative for apnea, choking, chest tightness and stridor.   Cardiovascular: Negative for palpitations.  Gastrointestinal: Negative for nausea, constipation, blood in stool and abdominal distention.  Genitourinary: Negative for dysuria, urgency, frequency, hematuria, flank pain, decreased urine volume and difficulty urinating.  Musculoskeletal: Negative for myalgias, back pain, joint swelling, arthralgias and gait problem.  Skin: Negative for color change and pallor.  Neurological: Positive for  tremors. Negative for dizziness, seizures, syncope, speech difficulty, weakness, light-headedness and numbness.  Hematological: Negative for adenopathy. Does not bruise/bleed easily.  Psychiatric/Behavioral: Positive for disturbed wake/sleep cycle and agitation. Negative for confusion. The patient is nervous/anxious.        Objective:   Physical Exam   Filed Vitals:   06/16/11 1423  BP: 126/68  Pulse: 56  Temp: 98 F (36.7 C)  TempSrc: Oral  Height: 5\' 11"  (1.803 m)  Weight: 216 lb 9.6 oz (98.249 kg)  SpO2: 96%    Gen: Pleasant, well-nourished, in no distress,  normal affect  ENT: No lesions,  mouth clear,  oropharynx clear, no postnasal drip  Neck: No JVD, no TMG, no carotid bruits  Lungs: No use of accessory muscles, no dullness to percussion, distant BS  Cardiovascular: RRR, heart sounds normal, no murmur or gallops, no peripheral edema  Abdomen: soft and NT, no HSM,  BS normal  Musculoskeletal: No deformities, no cyanosis or clubbing  Neuro: alert, non focal  Skin: Warm, no lesions or rashes      Assessment & Plan:   Obstructive chronic bronchitis without exacerbation Chronic bronchitis due to smoking now improved with smoking cessation No clear indication or need for chronic bronchodilator or inhaled steroid therapy Plan No additional inhalers are prescribed Patient to continue to pursue smoking cessation and will use Nicorette minis as needed since he is currently using chewing tobacco at this time for his nicotine replacement The patient  was counseled against using chewing tobacco for the nicotine replacement therapy     Updated Medication List Outpatient Encounter Prescriptions as of 06/16/2011  Medication Sig Dispense Refill  . aspirin 81 MG tablet Take 81 mg by mouth daily.        . dabigatran (PRADAXA) 150 MG CAPS Take 150 mg by mouth every 12 (twelve) hours.        . dronedarone (MULTAQ) 400 MG tablet Take 400 mg by mouth 2 (two) times daily.         Marland Kitchen omega-3 acid ethyl esters (LOVAZA) 1 G capsule Take 2 g by mouth daily.        . pantoprazole (PROTONIX) 40 MG tablet Take 40 mg by mouth daily.        . rosuvastatin (CRESTOR) 20 MG tablet Take 20 mg by mouth daily.        Marland Kitchen zolpidem (AMBIEN) 10 MG tablet Take 10 mg by mouth at bedtime as needed.        Marland Kitchen DISCONTD: azithromycin (ZITHROMAX) 250 MG tablet Take as directed  6 each  0  . DISCONTD: mometasone-formoterol (DULERA) 100-5 MCG/ACT AERO Inhale  2 puffs into the lungs 2 (two) times daily.  1 Inhaler  6  . DISCONTD: predniSONE (DELTASONE) 10 MG tablet Take 1 tablet (10 mg total) by mouth daily. Take 4 x 2 days 3 for 2days, two for two days , one for two and stop  20 tablet  0

## 2011-06-16 NOTE — Patient Instructions (Signed)
No change in medications. Return in         4 months 

## 2011-06-16 NOTE — Assessment & Plan Note (Signed)
Chronic bronchitis due to smoking now improved with smoking cessation No clear indication or need for chronic bronchodilator or inhaled steroid therapy Plan No additional inhalers are prescribed Patient to continue to pursue smoking cessation and will use Nicorette minis as needed since he is currently using chewing tobacco at this time for his nicotine replacement The patient  was counseled against using chewing tobacco for the nicotine replacement therapy

## 2011-10-20 ENCOUNTER — Ambulatory Visit: Payer: Medicare Other | Admitting: Critical Care Medicine

## 2011-12-17 ENCOUNTER — Encounter: Payer: Self-pay | Admitting: Critical Care Medicine

## 2011-12-17 ENCOUNTER — Ambulatory Visit (INDEPENDENT_AMBULATORY_CARE_PROVIDER_SITE_OTHER): Payer: Medicare Other | Admitting: Critical Care Medicine

## 2011-12-17 VITALS — BP 122/60 | HR 64 | Temp 98.4°F | Ht 71.5 in | Wt 224.4 lb

## 2011-12-17 DIAGNOSIS — J441 Chronic obstructive pulmonary disease with (acute) exacerbation: Secondary | ICD-10-CM

## 2011-12-17 MED ORDER — DOXYCYCLINE HYCLATE 100 MG PO TBEC: 100.0000 mg | DELAYED_RELEASE_TABLET | Freq: Two times a day (BID) | ORAL | Status: DC

## 2011-12-17 MED ORDER — TIOTROPIUM BROMIDE MONOHYDRATE 18 MCG IN CAPS: 18.0000 ug | ORAL_CAPSULE | Freq: Every day | RESPIRATORY_TRACT | Status: DC

## 2011-12-17 NOTE — Progress Notes (Deleted)
Patient ID: Travis [name], male   DOB: 11/06/1940, 71 y.o.   MRN: [id number]

## 2011-12-17 NOTE — Progress Notes (Signed)
Subjective:    Patient ID: Travis [name], male    DOB: 31-Mar-1940, 71 y.o.   MRN: [id number]  HPI  06/16/2011 Last seen 9/12.  Did not quit smoking 06/03/11.  Since off cigarettes not much cough.  No chest pain. Still dyspneic with exertion. Pt denies any significant sore throat, nasal congestion or excess secretions, fever, chills, sweats, unintended weight loss, pleurtic or exertional chest pain, orthopnea PND, or leg swelling Pt denies any increase in rescue therapy over baseline, denies waking up needing it or having any early am or nocturnal exacerbations of coughing/wheezing/or dyspnea. Pt also denies any obvious fluctuation in symptoms with  weather or environmental change or other alleviating or aggravating factors  12/17/2011 Pt quit smoking 5/26 until again restarted 3 cig/ day in 10/13.  Now up to a pack a day. Pt had a URI two months ago and now is worse. Pt still coughing up thick mucus.  Mucus is not clear. No real chest pain.  No real wheeze.  Notes more dyspnea.  No f/c/s.   Past Medical History  Diagnosis Date  . PAF (paroxysmal atrial fibrillation)   . Hypertension   . Dyslipidemia   . OSA (obstructive sleep apnea)   . Psoriasis   . Insomnia   . Lung cancer     s/p lobectomy, chemo and radiation therapy  . CAD (coronary artery disease)     s/p RCA atherectomy 1994     Family History  Problem Relation Age of Onset  . Heart attack Father   . Coronary artery disease Brother   . Lymphoma Brother   . Breast cancer Sister   . Diabetes Sister   . Heart disease Sister      History   Social History  . Marital Status: Married    Spouse Name: N/A    Number of Children: 3  . 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 Smoking status: Current Every Day Smoker -- 0.5 packs/day    Types: Cigarettes  . Smokeless tobacco: Current User    Types: Chew     Comment: started smoking at age 89.  Marland Kitchen Alcohol Use:  Not on file  . Drug Use: Not on file  . Sexually Active: Not on file   Other Topics Concern  . Not on file   Social History Narrative  . No narrative on file     No Known Allergies   Outpatient Prescriptions Prior to Visit  Medication Sig Dispense Refill  . aspirin 81 MG tablet Take 81 mg by mouth daily.        . dabigatran (PRADAXA) 150 MG CAPS Take 150 mg by mouth every 12 (twelve) hours.        . dronedarone (MULTAQ) 400 MG tablet Take 400 mg by mouth 2 (two) times daily.        Marland Kitchen omega-3 acid ethyl esters (LOVAZA) 1 G capsule Take 2 g by mouth daily.        . pantoprazole (PROTONIX) 40 MG tablet Take 40 mg by mouth daily.        . rosuvastatin (CRESTOR) 20 MG tablet Take 20 mg by mouth daily.        Marland Kitchen zolpidem (AMBIEN) 10 MG tablet Take 5 mg by mouth at bedtime as needed.       Last reviewed on 12/17/2011  1:50 PM by Storm Frisk, MD  Review of Systems  Constitutional: Positive for fatigue. Negative for chills, diaphoresis, activity change, appetite change and unexpected weight change.  HENT: Positive for trouble swallowing and sinus pressure. Negative for hearing loss, nosebleeds, congestion, facial swelling, sneezing, mouth sores, neck stiffness, dental problem, voice change, postnasal drip, tinnitus and ear discharge.   Eyes: Negative for photophobia, discharge, itching and visual disturbance.  Respiratory: Positive for cough. Negative for apnea, choking, chest tightness and stridor.   Cardiovascular: Negative for palpitations.  Gastrointestinal: Negative for nausea, constipation, blood in stool and abdominal distention.  Genitourinary: Negative for dysuria, urgency, frequency, hematuria, flank pain, decreased urine volume and difficulty urinating.  Musculoskeletal: Negative for myalgias, back pain, joint swelling, arthralgias and gait problem.  Skin: Negative for color change and pallor.  Neurological: Positive for tremors. Negative for dizziness, seizures,  syncope, speech difficulty, weakness, light-headedness and numbness.  Hematological: Negative for adenopathy. Does not bruise/bleed easily.  Psychiatric/Behavioral: Positive for sleep disturbance and agitation. Negative for confusion. The patient is nervous/anxious.        Objective:   Physical Exam   Filed Vitals:   12/17/11 1340  BP: 122/60  Pulse: 64  Temp: 98.4 F (36.9 C)  TempSrc: Oral  Height: 5' 11.5" (1.816 m)  Weight: 224 lb 6.4 oz (101.787 kg)  SpO2: 91%    Gen: Pleasant, well-nourished, in no distress,  normal affect  ENT: No lesions,  mouth clear,  oropharynx clear, no postnasal drip  Neck: No JVD, no TMG, no carotid bruits  Lungs: No use of accessory muscles, no dullness to percussion, distant BS  Cardiovascular: RRR, heart sounds normal, no murmur or gallops, no peripheral edema  Abdomen: soft and NT, no HSM,  BS normal  Musculoskeletal: No deformities, no cyanosis or clubbing  Neuro: alert, non focal  Skin: Warm, no lesions or rashes      Assessment & Plan:   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 aspirin 81 MG tablet Take 81 mg by mouth daily.        . dabigatran (PRADAXA) 150 MG CAPS Take 150 mg by mouth every 12 (twelve) hours.        . dronedarone (MULTAQ) 400 MG tablet Take 400 mg by mouth 2 (two) times daily.        Marland Kitchen omega-3 acid ethyl esters (LOVAZA) 1 G capsule Take 2 g by mouth daily.        . pantoprazole (PROTONIX) 40 MG tablet Take 40 mg by mouth daily.        . predniSONE (DELTASONE) 20 MG tablet Taper as directed by Dr. Foy Guadalajara      . ramelteon (ROZEREM) 8 MG tablet Take  8 mg by mouth at bedtime.      . rosuvastatin (CRESTOR) 20 MG tablet Take 20 mg by mouth daily.        Marland Kitchen zolpidem (AMBIEN) 10 MG tablet Take 5 mg by mouth at bedtime as needed.       . doxycycline (DORYX) 100 MG EC tablet Take 1 tablet (100 mg total) by mouth 2 (two) times daily.  14 tablet  0  . tiotropium (SPIRIVA HANDIHALER) 18 MCG inhalation capsule Place 1 capsule (18 mcg  total) into inhaler and inhale daily.  30 capsule  6

## 2011-12-17 NOTE — Patient Instructions (Addendum)
Doxycycline 100mg  twice daily for 7days Finish prednisone  Start Spiriva daily Stop smoking, use nicotine replacement like nicorettes, call if you want a prescription nicotine inhaler Return 2 months

## 2011-12-18 NOTE — Assessment & Plan Note (Signed)
Copd with acute exacerbation Plan Doxycycline 100mg  twice daily for 7days Finish prednisone  Start Spiriva daily Stop smoking, use nicotine replacement like nicorettes, call if you want a prescription nicotine inhaler Return 2 months

## 2012-03-03 ENCOUNTER — Ambulatory Visit: Payer: Medicare Other | Admitting: Critical Care Medicine

## 2012-03-27 ENCOUNTER — Ambulatory Visit: Payer: Medicare Other | Admitting: Critical Care Medicine

## 2012-03-29 ENCOUNTER — Ambulatory Visit: Payer: Medicare Other | Admitting: Critical Care Medicine

## 2012-08-17 ENCOUNTER — Ambulatory Visit (INDEPENDENT_AMBULATORY_CARE_PROVIDER_SITE_OTHER): Payer: Medicare Other | Admitting: Internal Medicine

## 2012-08-17 ENCOUNTER — Telehealth: Payer: Self-pay | Admitting: Internal Medicine

## 2012-08-17 ENCOUNTER — Encounter: Payer: Self-pay | Admitting: Internal Medicine

## 2012-08-17 ENCOUNTER — Ambulatory Visit (INDEPENDENT_AMBULATORY_CARE_PROVIDER_SITE_OTHER)
Admission: RE | Admit: 2012-08-17 | Discharge: 2012-08-17 | Disposition: A | Discharge status: Home / Self Care | Payer: Medicare Other | Source: Ambulatory Visit | Attending: Internal Medicine | Admitting: Internal Medicine

## 2012-08-17 ENCOUNTER — Other Ambulatory Visit (INDEPENDENT_AMBULATORY_CARE_PROVIDER_SITE_OTHER): Payer: Medicare Other

## 2012-08-17 ENCOUNTER — Ambulatory Visit: Payer: Medicare Other | Admitting: Internal Medicine

## 2012-08-17 VITALS — BP 118/58 | HR 60 | Ht 71.0 in | Wt 217.0 lb

## 2012-08-17 DIAGNOSIS — J42 Unspecified chronic bronchitis: Secondary | ICD-10-CM

## 2012-08-17 DIAGNOSIS — R042 Hemoptysis: Secondary | ICD-10-CM

## 2012-08-17 DIAGNOSIS — F17200 Nicotine dependence, unspecified, uncomplicated: Secondary | ICD-10-CM

## 2012-08-17 DIAGNOSIS — J441 Chronic obstructive pulmonary disease with (acute) exacerbation: Secondary | ICD-10-CM

## 2012-08-17 LAB — BASIC METABOLIC PANEL
BUN: 13 mg/dL (ref 6–23)
CO2: 32 mEq/L (ref 19–32)
Calcium: 9.3 mg/dL (ref 8.4–10.5)
Chloride: 105 mEq/L (ref 96–112)
Creatinine, Ser: 1.3 mg/dL (ref 0.4–1.5)
Glucose, Bld: 115 mg/dL — ABNORMAL HIGH (ref 70–99)

## 2012-08-17 LAB — CBC WITH DIFFERENTIAL/PLATELET
Basophils Absolute: 0 10*3/uL (ref 0.0–0.1)
Basophils Relative: 0.3 % (ref 0.0–3.0)
Eosinophils Absolute: 0.3 10*3/uL (ref 0.0–0.7)
Hemoglobin: 14.8 g/dL (ref 13.0–17.0)
Lymphocytes Relative: 28.2 % (ref 12.0–46.0)
Lymphs Abs: 2.4 10*3/uL (ref 0.7–4.0)
MCHC: 33.7 g/dL (ref 30.0–36.0)
MCV: 85.7 fl (ref 78.0–100.0)
Monocytes Absolute: 0.6 10*3/uL (ref 0.1–1.0)
Neutro Abs: 5.1 10*3/uL (ref 1.4–7.7)
RDW: 14.8 % — ABNORMAL HIGH (ref 11.5–14.6)

## 2012-08-17 LAB — PROTIME-INR: INR: 1.4 ratio — ABNORMAL HIGH (ref 0.8–1.0)

## 2012-08-17 MED ORDER — AZITHROMYCIN 250 MG PO TABS: ORAL_TABLET | ORAL | Status: DC

## 2012-08-17 MED ORDER — DOXYCYCLINE HYCLATE 100 MG PO TABS: ORAL_TABLET | ORAL | Status: DC

## 2012-08-17 MED ORDER — METHYLPREDNISOLONE ACETATE 80 MG/ML IJ SUSP
80.0000 mg | Freq: Once | INTRAMUSCULAR | Status: AC
  Administered 2012-08-17: 80 mg via INTRAMUSCULAR

## 2012-08-17 MED ORDER — LEVALBUTEROL HCL 0.63 MG/3ML IN NEBU
0.6300 mg | INHALATION_SOLUTION | Freq: Once | RESPIRATORY_TRACT | Status: AC
  Administered 2012-08-17: 0.63 mg via RESPIRATORY_TRACT

## 2012-08-17 NOTE — Telephone Encounter (Signed)
CY - please advise. Thanks! 

## 2012-08-17 NOTE — Progress Notes (Signed)
Subjective:    Patient ID: Travis [name], male    DOB: [birth year], 72 y.o.   MRN: [id number]  HPI 06/16/2011 Last seen 9/12.  Did not quit smoking 06/03/11.  Since off cigarettes not much cough.  No chest pain. Still dyspneic with exertion. Pt denies any significant sore throat, nasal congestion or excess secretions, fever, chills, sweats, unintended weight loss, pleurtic or exertional chest pain, orthopnea PND, or leg swelling Pt denies any increase in rescue therapy over baseline, denies waking up needing it or having any early am or nocturnal exacerbations of coughing/wheezing/or dyspnea. Pt also denies any obvious fluctuation in symptoms with  weather or environmental change or other alleviating or aggravating factors  12/17/2011 Pt quit smoking 5/26 until again restarted 3 cig/ day in 10/13.  Now up to a pack a day. Pt had a URI two months ago and now is worse. Pt still coughing up thick mucus.  Mucus is not clear. No real chest pain.  No real wheeze.  Notes more dyspnea.  No f/c/s.  08/17/12- 72 yoM smoker followed by Dr Delford Field-  Last seen 9/12.  Did not quit smoking 06/03/11.  Marland Kitchen Hx Lung cancer resection and seeds 2007 LUL    Hx CAD/ Stent       Wife here     ACUTE VISIT: PW patient; coughing up blood-seen it last night-bright red in color. SOB and wheezing as well. Increased cough for 2 or 3 days. 6 streaks of hemoptysis and clear sputum starting last night. Little today. Denies fever, chest pain, change in baseline shortness of breath. Recent increased sinus congestion blamed on going in and out of doors. Weight down 7 pounds since December. On aspirin and Pradaxa with some easy bruising on his arms but no other blood loss seen.   Past Medical History  Diagnosis Date  . PAF (paroxysmal atrial fibrillation)   . Hypertension   . Dyslipidemia   . OSA (obstructive sleep apnea)   . Psoriasis   . Insomnia   . Lung cancer     s/p lobectomy, chemo and radiation therapy  . CAD  (coronary artery disease)     s/p RCA atherectomy 1994     Family History  Problem Relation Age of Onset  . Heart attack Father   . Coronary artery disease Brother   . Lymphoma Brother   . Breast cancer Sister   . Diabetes Sister   . Heart disease Sister      History   Social History  . Marital Status: Married    Spouse Name: N/A    Number of Children: 3  . 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 Smoking status: Current Every Day Smoker -- 0.50 packs/day    Types: Cigarettes  . Smokeless tobacco: Current User    Types: Chew     Comment: started smoking at age 33.  Marland Kitchen Alcohol Use: Not on file  . Drug Use: Not on file  . Sexual Activity: Not on file   Other Topics Concern  . Not on file   Social History Narrative  . No narrative on file     No Known Allergies   Outpatient Prescriptions Prior to Visit  Medication Sig Dispense Refill  . aspirin 81 MG tablet Take 81 mg by mouth daily.        . dabigatran (PRADAXA) 150 MG CAPS Take 150 mg by mouth every 12 (  twelve) hours.        . dronedarone (MULTAQ) 400 MG tablet Take 400 mg by mouth 2 (two) times daily.        Marland Kitchen omega-3 acid ethyl esters (LOVAZA) 1 G capsule Take 2 g by mouth daily.        . pantoprazole (PROTONIX) 40 MG tablet Take 40 mg by mouth daily.        . rosuvastatin (CRESTOR) 20 MG tablet Take 20 mg by mouth daily.        Marland Kitchen tiotropium (SPIRIVA HANDIHALER) 18 MCG inhalation capsule Place 1 capsule (18 mcg total) into inhaler and inhale daily.  30 capsule  6  . doxycycline (DORYX) 100 MG EC tablet Take 1 tablet (100 mg total) by mouth 2 (two) times daily.  14 tablet  0  . predniSONE (DELTASONE) 20 MG tablet Taper as directed by Dr. Foy Guadalajara      . ramelteon (ROZEREM) 8 MG tablet Take 8 mg by mouth at bedtime.      Marland Kitchen zolpidem (AMBIEN) 10 MG tablet Take 5 mg by mouth at bedtime as needed.        No facility-administered medications prior to visit.        Review of Systems  Constitutional: Positive for fatigue. Negative for chills, diaphoresis, activity change, appetite change and unexpected weight change.  HENT: Positive for trouble swallowing and sinus pressure. Negative for hearing loss, nosebleeds, congestion, facial swelling, sneezing, mouth sores, neck stiffness, dental problem, voice change, postnasal drip, tinnitus and ear discharge.   Eyes: Negative for photophobia, discharge, itching and visual disturbance.  Respiratory: Positive for cough. Negative for apnea, choking, chest tightness and stridor.   Cardiovascular: Negative for palpitations.  Gastrointestinal: Negative for nausea, constipation, blood in stool and abdominal distention.  Genitourinary: Negative for dysuria, urgency, frequency, hematuria, flank pain, decreased urine volume and difficulty urinating.  Musculoskeletal: Negative for myalgias, back pain, joint swelling, arthralgias and gait problem.  Skin: Negative for color change and pallor.  Neurological: Positive for tremors. Negative for dizziness, seizures, syncope, speech difficulty, weakness, light-headedness and numbness.  Hematological: Negative for adenopathy. Does not bruise/bleed easily.  Psychiatric/Behavioral: Positive for sleep disturbance and agitation. Negative for confusion. The patient is nervous/anxious.        Objective:   Physical Exam  Filed Vitals:   08/17/12 1429  BP: 118/58  Pulse: 60  Height: 5\' 11"  (1.803 m)  Weight: 217 lb (98.431 kg)  SpO2: 95%    OBJ- Physical Exam General- Alert, Oriented, Affect-anxious, Distress- none acute Skin- mild echymoses on forearms Lymphadenopathy- none Head- atraumatic            Eyes- Gross vision intact, PERRLA, conjunctivae and secretions clear            Ears- Hearing, canals-normal            Nose- Clear, no-Septal dev, mucus, polyps, erosion, perforation             Throat- Mallampati II , mucosa clear , drainage- none, tonsils-  atrophic Neck- flexible , trachea midline, no stridor , thyroid nl, carotid no bruit Chest - symmetrical excursion , unlabored           Heart/CV- RRR , no murmur , no gallop  , no rub, nl s1 s2                           - JVD- none , edema- none, stasis changes- none,  varices- none           Lung- +bilateral wheeze, unlabored, cough- none , dullness-none, rub- none           Chest wall-  Abd- tender-no, distended-no, bowel sounds-present, HSM- no Br/ Gen/ Rectal- Not done, not indicated Extrem- cyanosis- none, clubbing, none, atrophy- none, strength- nl Neuro- grossly intact to observation    Assessment & Plan:   No problem-specific assessment & plan notes found for this encounter.   Updated Medication List Outpatient Encounter Prescriptions as of 08/17/2012  Medication Sig Dispense Refill  . aspirin 81 MG tablet Take 81 mg by mouth daily.        . dabigatran (PRADAXA) 150 MG CAPS Take 150 mg by mouth every 12 (twelve) hours.        . dronedarone (MULTAQ) 400 MG tablet Take 400 mg by mouth 2 (two) times daily.        Marland Kitchen lisinopril (PRINIVIL,ZESTRIL) 5 MG tablet Take 5 mg by mouth daily.      Marland Kitchen omega-3 acid ethyl esters (LOVAZA) 1 G capsule Take 2 g by mouth daily.        . pantoprazole (PROTONIX) 40 MG tablet Take 40 mg by mouth daily.        . rosuvastatin (CRESTOR) 20 MG tablet Take 20 mg by mouth daily.        . traZODone (DESYREL) 150 MG tablet Take 150 mg by mouth at bedtime.      Marland Kitchen tiotropium (SPIRIVA HANDIHALER) 18 MCG inhalation capsule Place 1 capsule (18 mcg total) into inhaler and inhale daily.  30 capsule  6  . [DISCONTINUED] doxycycline (DORYX) 100 MG EC tablet Take 1 tablet (100 mg total) by mouth 2 (two) times daily.  14 tablet  0  . [DISCONTINUED] predniSONE (DELTASONE) 20 MG tablet Taper as directed by Dr. Foy Guadalajara      . [DISCONTINUED] ramelteon (ROZEREM) 8 MG tablet Take 8 mg by mouth at bedtime.      . [DISCONTINUED] zolpidem (AMBIEN) 10 MG tablet Take 5 mg by mouth at  bedtime as needed.        No facility-administered encounter medications on file as of 08/17/2012.

## 2012-08-17 NOTE — Patient Instructions (Addendum)
Order- CXR  Dx hemoptysis  Order lab- CBC w/ diff, PTT, PT , BMET-     Dx hemoptysis, chronic bronchitis  Neb xop 0.63  Depo 80  Please get serious about the cigarettes- you don't want to keep dealing with this kind of problem.  Script for Z pak sent  Follow-up here in about 2-3 weeks with Dr Delford Field

## 2012-08-17 NOTE — Telephone Encounter (Signed)
Per CY-lets change the Zpak to Doxycycline 100 mg #8 take 2 today then 1 daily until gone no refills. I called this to the pharmacy. Updated EPIC med list.

## 2012-08-18 ENCOUNTER — Telehealth: Payer: Self-pay | Admitting: Critical Care Medicine

## 2012-08-18 DIAGNOSIS — R042 Hemoptysis: Secondary | ICD-10-CM | POA: Insufficient documentation

## 2012-08-18 NOTE — Telephone Encounter (Signed)
Returning call can be reached at [phone].Travis [name]

## 2012-08-18 NOTE — Assessment & Plan Note (Signed)
Without being heavy-handed about it, I took the obvious opportunity to push  towards complete smoking cessation

## 2012-08-18 NOTE — Telephone Encounter (Signed)
Spoke with patients spouse Results given as below Order placed for Ct chest w contrast Nothing further at this time  *Alternative # to be reached @ [phone]

## 2012-08-18 NOTE — Telephone Encounter (Signed)
Pt's wife returned call. Travis [name]

## 2012-08-18 NOTE — Progress Notes (Signed)
Quick Note:  LMTCB ______ 

## 2012-08-18 NOTE — Progress Notes (Signed)
Quick Note:  Spoke with patients spouse Results given as below Order placed for Ct chest w contrast Nothing further at this time ______ 

## 2012-08-18 NOTE — Progress Notes (Signed)
Quick Note:  Spoke with patients spouse Results given as below Order placed for Ct chest w contrast Nothing further at this time ______

## 2012-08-18 NOTE — Assessment & Plan Note (Signed)
Adding Z-Pak for now as we begin workup for hemoptysis

## 2012-08-18 NOTE — Assessment & Plan Note (Signed)
Obvious primary concern is possible recurrent lung cancer. Plan- CXR, address possibility of bronchitis with Z-Pak for now, smoking cessation, basic labs including coags, potential for CT, return to followup with Dr. Delford Field

## 2012-08-18 NOTE — Telephone Encounter (Signed)
ATC patient, no answer LMOMTCB 

## 2012-08-21 ENCOUNTER — Ambulatory Visit (INDEPENDENT_AMBULATORY_CARE_PROVIDER_SITE_OTHER)
Admission: RE | Admit: 2012-08-21 | Discharge: 2012-08-21 | Disposition: A | Discharge status: Home / Self Care | Payer: Medicare Other | Source: Ambulatory Visit | Attending: Internal Medicine | Admitting: Internal Medicine

## 2012-08-21 ENCOUNTER — Telehealth: Payer: Self-pay | Admitting: Critical Care Medicine

## 2012-08-21 ENCOUNTER — Ambulatory Visit: Payer: Medicare Other | Admitting: Internal Medicine

## 2012-08-21 DIAGNOSIS — R042 Hemoptysis: Secondary | ICD-10-CM

## 2012-08-21 DIAGNOSIS — C3490 Malignant neoplasm of unspecified part of unspecified bronchus or lung: Secondary | ICD-10-CM

## 2012-08-21 MED ORDER — IOHEXOL 300 MG/ML  SOLN
80.0000 mL | Freq: Once | INTRAMUSCULAR | Status: AC | PRN
  Administered 2012-08-21: 80 mL via INTRAVENOUS

## 2012-08-21 NOTE — Telephone Encounter (Signed)
I spoke with PW re: results.   CT was ordered by [facility]. Dr. Delford Field looked at results.  Per PW:  Tell pt CT does show some changes. PW will try to call pt tomorrow to discuss.  In the meantime, work pt into Thursday's [facility] schedule.  -----  Called, spoke with pt.  Informed him of above.  He verbalized understanding.  We have scheduled pt to see PW on Thursday, Aug 21 at 9 am in [place].  Pt was given office address and phone #.  He verbalized understanding. Will route msg to PW to f/u on.

## 2012-08-21 NOTE — Telephone Encounter (Signed)
Calling again in ref to previous msg.Travis [name] ° °

## 2012-08-21 NOTE — Telephone Encounter (Signed)
Pt had CT Chest done today. He is request results. Dr. Delford Field, pls advise.  Thank you.

## 2012-08-22 NOTE — Telephone Encounter (Signed)
Pt is aware of results.  Will likely need Fob to be done next week and will see pt on aug 21

## 2012-08-24 ENCOUNTER — Ambulatory Visit (INDEPENDENT_AMBULATORY_CARE_PROVIDER_SITE_OTHER): Payer: Medicare Other | Admitting: Critical Care Medicine

## 2012-08-24 ENCOUNTER — Encounter: Payer: Self-pay | Admitting: Critical Care Medicine

## 2012-08-24 VITALS — BP 132/80 | HR 60 | Temp 98.4°F | Ht 71.5 in | Wt 210.0 lb

## 2012-08-24 DIAGNOSIS — R042 Hemoptysis: Secondary | ICD-10-CM

## 2012-08-24 DIAGNOSIS — F17200 Nicotine dependence, unspecified, uncomplicated: Secondary | ICD-10-CM

## 2012-08-24 NOTE — Assessment & Plan Note (Signed)
Hemoptysis in a patient is actively smoking Starting 08/14/2012 in a patient who is currently on aspirin and Robaxin. Prior history of lung cancer noted with right upper lobe partial resection and radiation mesh placement.   The CT scan that was obtained is concerning for recurrent malignancy right lower lobe and this needs to be further evaluated Plan Pursue bronchoscopy Continue to pursue smoking cessation

## 2012-08-24 NOTE — Patient Instructions (Addendum)
A bronchoscopy will be done at 8am at [facility] 08/28/12 Nothing by mouth after midnight the night before Return two weeks

## 2012-08-24 NOTE — Progress Notes (Signed)
Subjective:    Patient ID: Travis [name], male    DOB: 1940-09-20, 72 y.o.   MRN: [id number]  HPI  08/17/12- 1 yoM smoker followed by Dr Delford Field-  Last seen 9/12.  Did not quit smoking 06/03/11.  Marland Kitchen Hx Lung cancer resection and seeds 2007 LUL    Hx CAD/ Stent       Wife here     ACUTE VISIT: PW patient; coughing up blood-seen it last night-bright red in color. SOB and wheezing as well. Increased cough for 2 or 3 days. 6 streaks of hemoptysis and clear sputum starting last night. Little today. Denies fever, chest pain, change in baseline shortness of breath. Recent increased sinus congestion blamed on going in and out of doors. Weight down 7 pounds since December. On aspirin and Pradaxa with some easy bruising on his arms but no other blood loss seen.  08/24/2012 Chief Complaint  Patient presents with  . Follow-up    Hemoptysis better this am.  SOB unchanged.  Some wheezing and sweats.  No chest tightness,  chest pain, or fever.  Notes less mucus this am.  No chest pain. Weight about same.  Was 1PPD.    Past Medical History  Diagnosis Date  . PAF (paroxysmal atrial fibrillation)   . Hypertension   . Dyslipidemia   . OSA (obstructive sleep apnea)   . Psoriasis   . Insomnia   . Lung cancer     s/p lobectomy, chemo and radiation therapy  . CAD (coronary artery disease)     s/p RCA atherectomy 1994     Family History  Problem Relation Age of Onset  . Heart attack Father   . Coronary artery disease Brother   . Lymphoma Brother   . Breast cancer Sister   . Diabetes Sister   . Heart disease Sister      History   Social History  . Marital Status: Married    Spouse Name: N/A    Number of Children: 3  . 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 Smoking status: Former Smoker -- 0.75 packs/day for 58 years    Types: Cigarettes    Quit date: 08/19/2012  . Smokeless tobacco: Former Neurosurgeon    Types: Chew    Quit date:  07/04/2012     Comment: started smoking at age 66.  Marland Kitchen Alcohol Use: Not on file  . Drug Use: Not on file  . Sexual Activity: Not on file   Other Topics Concern  . Not on file   Social History Narrative  . No narrative on file     No Known Allergies   Outpatient Prescriptions Prior to Visit  Medication Sig Dispense Refill  . aspirin 81 MG tablet Take 81 mg by mouth daily.        . dabigatran (PRADAXA) 150 MG CAPS Take 150 mg by mouth every 12 (twelve) hours.        . dronedarone (MULTAQ) 400 MG tablet Take 400 mg by mouth 2 (two) times daily.        Marland Kitchen lisinopril (PRINIVIL,ZESTRIL) 5 MG tablet Take 5 mg by mouth daily.      Marland Kitchen omega-3 acid ethyl esters (LOVAZA) 1 G capsule Take 2 g by mouth daily.        . pantoprazole (PROTONIX) 40 MG tablet Take 40 mg by mouth daily.        . rosuvastatin (  CRESTOR) 20 MG tablet Take 20 mg by mouth daily.        . traZODone (DESYREL) 150 MG tablet Take 150 mg by mouth at bedtime.      Marland Kitchen tiotropium (SPIRIVA HANDIHALER) 18 MCG inhalation capsule Place 1 capsule (18 mcg total) into inhaler and inhale daily.  30 capsule  6  . doxycycline (VIBRA-TABS) 100 MG tablet Take 2 today then 1 daily  8 tablet  0   No facility-administered medications prior to visit.       Review of Systems  Constitutional: Positive for fatigue. Negative for chills, diaphoresis, activity change, appetite change and unexpected weight change.  HENT: Positive for trouble swallowing and sinus pressure. Negative for hearing loss, nosebleeds, congestion, facial swelling, sneezing, mouth sores, neck stiffness, dental problem, voice change, postnasal drip, tinnitus and ear discharge.   Eyes: Negative for photophobia, discharge, itching and visual disturbance.  Respiratory: Positive for cough. Negative for apnea, choking, chest tightness and stridor.   Cardiovascular: Negative for palpitations.  Gastrointestinal: Negative for nausea, constipation, blood in stool and abdominal  distention.  Genitourinary: Negative for dysuria, urgency, frequency, hematuria, flank pain, decreased urine volume and difficulty urinating.  Musculoskeletal: Negative for myalgias, back pain, joint swelling, arthralgias and gait problem.  Skin: Negative for color change and pallor.  Neurological: Positive for tremors. Negative for dizziness, seizures, syncope, speech difficulty, weakness, light-headedness and numbness.  Hematological: Negative for adenopathy. Does not bruise/bleed easily.  Psychiatric/Behavioral: Positive for sleep disturbance and agitation. Negative for confusion. The patient is nervous/anxious.        Objective:   Physical Exam   Filed Vitals:   08/24/12 0855  BP: 132/80  Pulse: 60  Temp: 98.4 F (36.9 C)  TempSrc: Oral  Height: 5' 11.5" (1.816 m)  Weight: 210 lb (95.255 kg)  SpO2: 98%    Gen: Pleasant, well-nourished, in no distress,  normal affect  ENT: No lesions,  mouth clear,  oropharynx clear, no postnasal drip  Neck: No JVD, no TMG, no carotid bruits  Lungs: No use of accessory muscles, no dullness to percussion, focal expired wheeze left lower lobe  Cardiovascular: RRR, heart sounds normal, no murmur or gallops, no peripheral edema  Abdomen: soft and NT, no HSM,  BS normal  Musculoskeletal: No deformities, no cyanosis or clubbing  Neuro: alert, non focal  Skin: Warm, no lesions or rashes  No results found. Recent CT scan of the chest reviewed    Assessment & Plan:   Hemoptysis, unspecified Hemoptysis in a patient is actively smoking Starting 08/14/2012 in a patient who is currently on aspirin and Robaxin. Prior history of lung cancer noted with right upper lobe partial resection and radiation mesh placement.   The CT scan that was obtained is concerning for recurrent malignancy right lower lobe and this needs to be further evaluated Plan Pursue bronchoscopy Continue to pursue smoking cessation  Nicotine addiction Greater than 10  minutes of smoking cessation counseling was issued to this patient Plan Pursue nicotine replacement therapy    Updated Medication List Outpatient Encounter Prescriptions as of 08/24/2012  Medication Sig Dispense Refill  . aspirin 81 MG tablet Take 81 mg by mouth daily.        . dabigatran (PRADAXA) 150 MG CAPS Take 150 mg by mouth every 12 (twelve) hours.        . dronedarone (MULTAQ) 400 MG tablet Take 400 mg by mouth 2 (two) times daily.        Marland Kitchen lisinopril (PRINIVIL,ZESTRIL) 5  MG tablet Take 5 mg by mouth daily.      Marland Kitchen omega-3 acid ethyl esters (LOVAZA) 1 G capsule Take 2 g by mouth daily.        . pantoprazole (PROTONIX) 40 MG tablet Take 40 mg by mouth daily.        . rosuvastatin (CRESTOR) 20 MG tablet Take 20 mg by mouth daily.        . traZODone (DESYREL) 150 MG tablet Take 150 mg by mouth at bedtime.      Marland Kitchen tiotropium (SPIRIVA HANDIHALER) 18 MCG inhalation capsule Place 1 capsule (18 mcg total) into inhaler and inhale daily.  30 capsule  6  . [DISCONTINUED] doxycycline (VIBRA-TABS) 100 MG tablet Take 2 today then 1 daily  8 tablet  0   No facility-administered encounter medications on file as of 08/24/2012.

## 2012-08-24 NOTE — Assessment & Plan Note (Signed)
Greater than 10 minutes of smoking cessation counseling was issued to this patient Plan Pursue nicotine replacement therapy

## 2012-08-27 MED ORDER — PHENYLEPHRINE HCL 0.25 % NA SOLN
1.0000 | Freq: Four times a day (QID) | NASAL | Status: DC | PRN
  Filled 2012-08-27: qty 15

## 2012-08-27 MED ORDER — BUTAMBEN-TETRACAINE-BENZOCAINE 2-2-14 % EX AERO: 1.0000 | INHALATION_SPRAY | Freq: Once | CUTANEOUS | Status: DC

## 2012-08-27 MED ORDER — LIDOCAINE HCL 2 % EX GEL: Freq: Once | CUTANEOUS | Status: DC

## 2012-08-28 ENCOUNTER — Encounter (HOSPITAL_COMMUNITY): Payer: Self-pay | Admitting: Radiology

## 2012-08-28 ENCOUNTER — Ambulatory Visit (HOSPITAL_COMMUNITY)
Admission: RE | Admit: 2012-08-28 | Discharge: 2012-08-28 | Disposition: A | Discharge status: Home / Self Care | Payer: Medicare Other | Source: Ambulatory Visit | Attending: Critical Care Medicine | Admitting: Critical Care Medicine

## 2012-08-28 ENCOUNTER — Encounter (HOSPITAL_COMMUNITY)
Admission: RE | Disposition: A | Discharge status: Home / Self Care | Payer: Self-pay | Source: Ambulatory Visit | Attending: Critical Care Medicine

## 2012-08-28 DIAGNOSIS — I2510 Atherosclerotic heart disease of native coronary artery without angina pectoris: Secondary | ICD-10-CM | POA: Insufficient documentation

## 2012-08-28 DIAGNOSIS — Z7982 Long term (current) use of aspirin: Secondary | ICD-10-CM | POA: Insufficient documentation

## 2012-08-28 DIAGNOSIS — G4733 Obstructive sleep apnea (adult) (pediatric): Secondary | ICD-10-CM | POA: Insufficient documentation

## 2012-08-28 DIAGNOSIS — Z902 Acquired absence of lung [part of]: Secondary | ICD-10-CM | POA: Insufficient documentation

## 2012-08-28 DIAGNOSIS — Z85118 Personal history of other malignant neoplasm of bronchus and lung: Secondary | ICD-10-CM | POA: Insufficient documentation

## 2012-08-28 DIAGNOSIS — R042 Hemoptysis: Secondary | ICD-10-CM | POA: Insufficient documentation

## 2012-08-28 DIAGNOSIS — Z9221 Personal history of antineoplastic chemotherapy: Secondary | ICD-10-CM | POA: Insufficient documentation

## 2012-08-28 DIAGNOSIS — I4891 Unspecified atrial fibrillation: Secondary | ICD-10-CM | POA: Insufficient documentation

## 2012-08-28 DIAGNOSIS — L408 Other psoriasis: Secondary | ICD-10-CM | POA: Insufficient documentation

## 2012-08-28 DIAGNOSIS — Z87891 Personal history of nicotine dependence: Secondary | ICD-10-CM | POA: Insufficient documentation

## 2012-08-28 DIAGNOSIS — E785 Hyperlipidemia, unspecified: Secondary | ICD-10-CM | POA: Insufficient documentation

## 2012-08-28 DIAGNOSIS — I10 Essential (primary) hypertension: Secondary | ICD-10-CM | POA: Insufficient documentation

## 2012-08-28 DIAGNOSIS — Z79899 Other long term (current) drug therapy: Secondary | ICD-10-CM | POA: Insufficient documentation

## 2012-08-28 DIAGNOSIS — Z923 Personal history of irradiation: Secondary | ICD-10-CM | POA: Insufficient documentation

## 2012-08-28 HISTORY — PX: VIDEO BRONCHOSCOPY: SHX5072

## 2012-08-28 SURGERY — VIDEO BRONCHOSCOPY WITHOUT FLUORO
Anesthesia: Moderate Sedation

## 2012-08-28 MED ORDER — MIDAZOLAM HCL 5 MG/ML IJ SOLN
INTRAMUSCULAR | Status: AC
  Filled 2012-08-28: qty 4

## 2012-08-28 MED ORDER — LIDOCAINE HCL 2 % EX GEL
CUTANEOUS | Status: DC | PRN
  Administered 2012-08-28: 1

## 2012-08-28 MED ORDER — MIDAZOLAM HCL 10 MG/2ML IJ SOLN
INTRAMUSCULAR | Status: DC | PRN
  Administered 2012-08-28: 2 mg via INTRAVENOUS
  Administered 2012-08-28: 3 mg via INTRAVENOUS

## 2012-08-28 MED ORDER — FENTANYL CITRATE 0.05 MG/ML IJ SOLN
INTRAMUSCULAR | Status: AC
  Filled 2012-08-28: qty 4

## 2012-08-28 MED ORDER — FENTANYL CITRATE 0.05 MG/ML IJ SOLN
INTRAMUSCULAR | Status: DC | PRN
  Administered 2012-08-28: 50 ug via INTRAVENOUS

## 2012-08-28 MED ORDER — PHENYLEPHRINE HCL 0.25 % NA SOLN
NASAL | Status: DC | PRN
  Administered 2012-08-28: 2 via NASAL

## 2012-08-28 MED ORDER — LIDOCAINE HCL 1 % IJ SOLN
INTRAMUSCULAR | Status: DC | PRN
  Administered 2012-08-28: 6 mL via RESPIRATORY_TRACT

## 2012-08-28 NOTE — Progress Notes (Addendum)
Bronch w/o  video intervention performed.  Bronchial washing intervention performed, bronchial biopsy intervention performed.  I was present  Shan Levans

## 2012-08-28 NOTE — Op Note (Signed)
Bronchoscopy Procedure Note  Date of Operation: 08/28/2012  Pre-op Diagnosis: Hemoptysis  Post-op Diagnosis: Hemoptysis  Surgeon: Shan Levans  Anesthesia: Monitored Local Anesthesia with Sedation  Operation: Flexible fiberoptic bronchoscopy, diagnostic   Findings: Bleeding from LUL lingula, distorted anatomy due to prior wedge resection LUL  Specimen: TBBx x7, Bronch washings LUL  Estimated Blood Loss: Minimal  Complications: none  Indications and History: The patient is a 72 y.o. male with hemoptysis and prior lung CA with LUL wedge resection and associated radiation mesh placement, evaluate recurrence.  The risks, benefits, complications, treatment options and expected outcomes were discussed with the patient.  The possibilities of reaction to medication, pulmonary aspiration, perforation of a viscus, bleeding, failure to diagnose a condition and creating a complication requiring transfusion or operation were discussed with the patient who freely signed the consent.    Description of Procedure: The patient was re-examined in the bronchoscopy suite and the site of surgery properly noted/marked.  The patient was identified as Travis [name] and the procedure verified as Flexible Fiberoptic Bronchoscopy.  A Time Out was held and the above information confirmed.   After the induction of topical nasopharyngeal anesthesia, the patient was positioned  and the bronchoscope was passed through the L nares. The vocal cords were visualized and  1% buffered lidocaine 5 ml was topically placed onto the cords. The cords were normal, minimal blood seen across cords. The scope was then passed into the trachea.  1% buffered lidocaine 5 ml was used topically on the carina.  Careful inspection of the tracheal lumen was accomplished. The scope was sequentially passed into the left main and then left upper and lower bronchi and segmental bronchi.     TBBx X7 and bronchial washings  were done and there  was two specimen.   The scope was then withdrawn and advanced into the right main bronchus and then into the RUL, RML, and RLL bronchi and segmental bronchi.  No specimens obtained.  Endobronchial findings: Bleeding from LUL orifice with distorted anatomy due to prior wedge resection.  No endobronchial lesion seen. Blood seems to emanate from the lingular segments. Trachea: Normal mucosa Carina: Normal mucosa Right main bronchus: Normal mucosa Right upper lobe bronchus: Normal mucosa Right middle lobe bronchus: Normal mucosa Right lower lobe bronchus: Normal mucosa Left main bronchus: Normal mucosa Left upper lobe bronchus: distorted anatomy, blood from linglar segmental orifices Left lower lobe bronchus: Normal mucosa  The Patient was taken to the Endoscopy Recovery area in satisfactory condition.  Attestation: I performed the procedure.  Luisa Hart WrightMD

## 2012-08-28 NOTE — Interval H&P Note (Signed)
I have seen and examined this pt and there are no chnges in the H/P . The pt is ready for FOB and conscious sedation. Luisa Hart WrightMD

## 2012-08-28 NOTE — H&P (View-Only) (Signed)
Subjective:    Patient ID: Travis [name], male    DOB: 10/06/1940, 72 y.o.   MRN: [id number]  HPI  08/17/12- 72 yoM smoker followed by Dr Saleen Peden-  Last seen 9/12.  Did not quit smoking 06/03/11.  . Hx Lung cancer resection and seeds 2007 LUL    Hx CAD/ Stent       Wife here     ACUTE VISIT: PW patient; coughing up blood-seen it last night-bright red in color. SOB and wheezing as well. Increased cough for 2 or 3 days. 6 streaks of hemoptysis and clear sputum starting last night. Little today. Denies fever, chest pain, change in baseline shortness of breath. Recent increased sinus congestion blamed on going in and out of doors. Weight down 7 pounds since December. On aspirin and Pradaxa with some easy bruising on his arms but no other blood loss seen.  08/24/2012 Chief Complaint  Patient presents with  . Follow-up    Hemoptysis better this am.  SOB unchanged.  Some wheezing and sweats.  No chest tightness,  chest pain, or fever.  Notes less mucus this am.  No chest pain. Weight about same.  Was 1PPD.    Past Medical History  Diagnosis Date  . PAF (paroxysmal atrial fibrillation)   . Hypertension   . Dyslipidemia   . OSA (obstructive sleep apnea)   . Psoriasis   . Insomnia   . Lung cancer     s/p lobectomy, chemo and radiation therapy  . CAD (coronary artery disease)     s/p RCA atherectomy 1994     Family History  Problem Relation Age of Onset  . Heart attack Father   . Coronary artery disease Brother   . Lymphoma Brother   . Breast cancer Sister   . Diabetes Sister   . Heart disease Sister      History   Social History  . Marital Status: Married    Spouse Name: N/A    Number of Children: 3  . Years of Education: N/A   Occupational History  . Truck Driver    Social History Main Topics  . Smoking status: Former Smoker -- 0.75 packs/day for 58 years    Types: Cigarettes    Quit date: 08/19/2012  . Smokeless tobacco: Former User    Types: Chew    Quit date:  07/04/2012     Comment: started smoking at age 14.  . Alcohol Use: Not on file  . Drug Use: Not on file  . Sexual Activity: Not on file   Other Topics Concern  . Not on file   Social History Narrative  . No narrative on file     No Known Allergies   Outpatient Prescriptions Prior to Visit  Medication Sig Dispense Refill  . aspirin 81 MG tablet Take 81 mg by mouth daily.        . dabigatran (PRADAXA) 150 MG CAPS Take 150 mg by mouth every 12 (twelve) hours.        . dronedarone (MULTAQ) 400 MG tablet Take 400 mg by mouth 2 (two) times daily.        . lisinopril (PRINIVIL,ZESTRIL) 5 MG tablet Take 5 mg by mouth daily.      . omega-3 acid ethyl esters (LOVAZA) 1 G capsule Take 2 g by mouth daily.        . pantoprazole (PROTONIX) 40 MG tablet Take 40 mg by mouth daily.        . rosuvastatin (  CRESTOR) 20 MG tablet Take 20 mg by mouth daily.        . traZODone (DESYREL) 150 MG tablet Take 150 mg by mouth at bedtime.      . tiotropium (SPIRIVA HANDIHALER) 18 MCG inhalation capsule Place 1 capsule (18 mcg total) into inhaler and inhale daily.  30 capsule  6  . doxycycline (VIBRA-TABS) 100 MG tablet Take 2 today then 1 daily  8 tablet  0   No facility-administered medications prior to visit.       Review of Systems  Constitutional: Positive for fatigue. Negative for chills, diaphoresis, activity change, appetite change and unexpected weight change.  HENT: Positive for trouble swallowing and sinus pressure. Negative for hearing loss, nosebleeds, congestion, facial swelling, sneezing, mouth sores, neck stiffness, dental problem, voice change, postnasal drip, tinnitus and ear discharge.   Eyes: Negative for photophobia, discharge, itching and visual disturbance.  Respiratory: Positive for cough. Negative for apnea, choking, chest tightness and stridor.   Cardiovascular: Negative for palpitations.  Gastrointestinal: Negative for nausea, constipation, blood in stool and abdominal  distention.  Genitourinary: Negative for dysuria, urgency, frequency, hematuria, flank pain, decreased urine volume and difficulty urinating.  Musculoskeletal: Negative for myalgias, back pain, joint swelling, arthralgias and gait problem.  Skin: Negative for color change and pallor.  Neurological: Positive for tremors. Negative for dizziness, seizures, syncope, speech difficulty, weakness, light-headedness and numbness.  Hematological: Negative for adenopathy. Does not bruise/bleed easily.  Psychiatric/Behavioral: Positive for sleep disturbance and agitation. Negative for confusion. The patient is nervous/anxious.        Objective:   Physical Exam   Filed Vitals:   08/24/12 0855  BP: 132/80  Pulse: 60  Temp: 98.4 F (36.9 C)  TempSrc: Oral  Height: 5' 11.5" (1.816 m)  Weight: 210 lb (95.255 kg)  SpO2: 98%    Gen: Pleasant, well-nourished, in no distress,  normal affect  ENT: No lesions,  mouth clear,  oropharynx clear, no postnasal drip  Neck: No JVD, no TMG, no carotid bruits  Lungs: No use of accessory muscles, no dullness to percussion, focal expired wheeze left lower lobe  Cardiovascular: RRR, heart sounds normal, no murmur or gallops, no peripheral edema  Abdomen: soft and NT, no HSM,  BS normal  Musculoskeletal: No deformities, no cyanosis or clubbing  Neuro: alert, non focal  Skin: Warm, no lesions or rashes  No results found. Recent CT scan of the chest reviewed    Assessment & Plan:   Hemoptysis, unspecified Hemoptysis in a patient is actively smoking Starting 08/14/2012 in a patient who is currently on aspirin and Robaxin. Prior history of lung cancer noted with right upper lobe partial resection and radiation mesh placement.   The CT scan that was obtained is concerning for recurrent malignancy right lower lobe and this needs to be further evaluated Plan Pursue bronchoscopy Continue to pursue smoking cessation  Nicotine addiction Greater than 10  minutes of smoking cessation counseling was issued to this patient Plan Pursue nicotine replacement therapy    Updated Medication List Outpatient Encounter Prescriptions as of 08/24/2012  Medication Sig Dispense Refill  . aspirin 81 MG tablet Take 81 mg by mouth daily.        . dabigatran (PRADAXA) 150 MG CAPS Take 150 mg by mouth every 12 (twelve) hours.        . dronedarone (MULTAQ) 400 MG tablet Take 400 mg by mouth 2 (two) times daily.        . lisinopril (PRINIVIL,ZESTRIL) 5   MG tablet Take 5 mg by mouth daily.      . omega-3 acid ethyl esters (LOVAZA) 1 G capsule Take 2 g by mouth daily.        . pantoprazole (PROTONIX) 40 MG tablet Take 40 mg by mouth daily.        . rosuvastatin (CRESTOR) 20 MG tablet Take 20 mg by mouth daily.        . traZODone (DESYREL) 150 MG tablet Take 150 mg by mouth at bedtime.      . tiotropium (SPIRIVA HANDIHALER) 18 MCG inhalation capsule Place 1 capsule (18 mcg total) into inhaler and inhale daily.  30 capsule  6  . [DISCONTINUED] doxycycline (VIBRA-TABS) 100 MG tablet Take 2 today then 1 daily  8 tablet  0   No facility-administered encounter medications on file as of 08/24/2012.      

## 2012-08-28 NOTE — Progress Notes (Signed)
Report given to endo RN Dennie Bible

## 2012-08-29 ENCOUNTER — Encounter (HOSPITAL_COMMUNITY): Payer: Self-pay | Admitting: Critical Care Medicine

## 2012-08-30 ENCOUNTER — Telehealth: Payer: Self-pay | Admitting: Critical Care Medicine

## 2012-08-30 DIAGNOSIS — R042 Hemoptysis: Secondary | ICD-10-CM

## 2012-08-30 LAB — CULTURE, RESPIRATORY W GRAM STAIN

## 2012-08-30 NOTE — Telephone Encounter (Signed)
I spoke with pt and he is wanting to know some type of results from his bronch. He is very anxious. Please advise Dr. Delford Field thanks

## 2012-08-30 NOTE — Telephone Encounter (Signed)
Pathology not done with his samples, may not be before tomorrow I will call asap with results

## 2012-08-30 NOTE — Telephone Encounter (Signed)
I called pt, Fob neg for CA Pt needs to stop smoking

## 2012-08-30 NOTE — Telephone Encounter (Signed)
I spoke with pt and is aware. He needed nothing further

## 2012-09-01 ENCOUNTER — Telehealth: Payer: Self-pay | Admitting: *Deleted

## 2012-09-01 NOTE — Telephone Encounter (Signed)
Spoke with pt's wife.  She is aware PET scan is to be scheduled I have informed her this is scheduled for Sept 2 at 11 am at [facility].  Pt is to arrive at 10:45 am to Radiology.  NPO after MN with no gum or sugar candy.  Wife aware of all instructions, date, and time of appt.  She will inform pt and voiced no further questions or concerns at this time.

## 2012-09-01 NOTE — Telephone Encounter (Signed)
Spoke with Bjorn Loser.  She will work on scheduling this for pt before his appt with Dr. Delford Field on Thursday.  Order has been placed.   lmtcb to inform pt of this.

## 2012-09-01 NOTE — Telephone Encounter (Signed)
Message copied by Gweneth Dimitri D on Fri Sep 01, 2012 10:42 AM ------      Message from: Gweneth Dimitri D      Created: Thu Aug 31, 2012  5:06 PM      Regarding: needs PET       Pt needs PET scan set up to eval for lung ca before his appt on Thursday, Sept 4 in [place] with PW.   ------

## 2012-09-01 NOTE — Telephone Encounter (Signed)
LM for pt to return my call in reference to scan appointment. Travis [name]

## 2012-09-01 NOTE — Telephone Encounter (Signed)
Called pt's home # - spoke with his son.  Was advised pt is not there and [place].  Was advised I may be able to reach him at [phone].  I called this # and line rang several times with no answer.  I called his cell # - went directly to msg stating the person has a VM that has not yet been set up.  Will try to call back before leaving today as this is a holiday weekend.

## 2012-09-05 ENCOUNTER — Encounter (HOSPITAL_COMMUNITY): Payer: Medicare Other

## 2012-09-06 ENCOUNTER — Ambulatory Visit: Payer: Medicare Other | Admitting: Critical Care Medicine

## 2012-09-07 ENCOUNTER — Ambulatory Visit: Payer: Medicare Other | Admitting: Critical Care Medicine

## 2012-09-07 ENCOUNTER — Encounter (HOSPITAL_COMMUNITY)
Admission: RE | Admit: 2012-09-07 | Discharge: 2012-09-07 | Disposition: A | Discharge status: Home / Self Care | Payer: Medicare Other | Source: Ambulatory Visit | Attending: Critical Care Medicine | Admitting: Critical Care Medicine

## 2012-09-07 ENCOUNTER — Encounter (HOSPITAL_COMMUNITY): Payer: Self-pay

## 2012-09-07 DIAGNOSIS — R918 Other nonspecific abnormal finding of lung field: Secondary | ICD-10-CM | POA: Insufficient documentation

## 2012-09-07 DIAGNOSIS — J189 Pneumonia, unspecified organism: Secondary | ICD-10-CM | POA: Insufficient documentation

## 2012-09-07 DIAGNOSIS — C3490 Malignant neoplasm of unspecified part of unspecified bronchus or lung: Secondary | ICD-10-CM | POA: Insufficient documentation

## 2012-09-07 MED ORDER — FLUDEOXYGLUCOSE F - 18 (FDG) INJECTION
17.3000 | Freq: Once | INTRAVENOUS | Status: AC | PRN
  Administered 2012-09-07: 17.3 via INTRAVENOUS

## 2012-09-11 ENCOUNTER — Encounter: Payer: Self-pay | Admitting: Critical Care Medicine

## 2012-09-11 ENCOUNTER — Ambulatory Visit (INDEPENDENT_AMBULATORY_CARE_PROVIDER_SITE_OTHER): Payer: Medicare Other | Admitting: Critical Care Medicine

## 2012-09-11 VITALS — BP 134/76 | HR 56 | Temp 98.2°F | Ht 71.5 in | Wt 209.0 lb

## 2012-09-11 DIAGNOSIS — R042 Hemoptysis: Secondary | ICD-10-CM

## 2012-09-11 DIAGNOSIS — J441 Chronic obstructive pulmonary disease with (acute) exacerbation: Secondary | ICD-10-CM

## 2012-09-11 DIAGNOSIS — Z23 Encounter for immunization: Secondary | ICD-10-CM

## 2012-09-11 DIAGNOSIS — F17200 Nicotine dependence, unspecified, uncomplicated: Secondary | ICD-10-CM

## 2012-09-11 DIAGNOSIS — C3490 Malignant neoplasm of unspecified part of unspecified bronchus or lung: Secondary | ICD-10-CM

## 2012-09-11 MED ORDER — TIOTROPIUM BROMIDE MONOHYDRATE 18 MCG IN CAPS: 18.0000 ug | ORAL_CAPSULE | Freq: Every day | RESPIRATORY_TRACT | Status: DC

## 2012-09-11 NOTE — Progress Notes (Signed)
Subjective:    Patient ID: Travis [name], male    DOB: 1940-03-10, 72 y.o.   MRN: [id number]  HPI 09/11/2012 Chief Complaint  Patient presents with  . Hemoptysis    Feels much better since last OV. Had PET scan done. Denies coughing up any more blood.  There has been no additional hemoptysis. There is less cough. There is less shortness of breath. There is no wheezing. Pt denies any significant sore throat, nasal congestion or excess secretions, fever, chills, sweats, unintended weight loss, pleurtic or exertional chest pain, orthopnea PND, or leg swelling Pt denies any increase in rescue therapy over baseline, denies waking up needing it or having any early am or nocturnal exacerbations of coughing/wheezing/or dyspnea. Pt also denies any obvious fluctuation in symptoms with  weather or environmental change or other alleviating or aggravating factors    Past Medical History  Diagnosis Date  . PAF (paroxysmal atrial fibrillation)   . Hypertension   . Dyslipidemia   . OSA (obstructive sleep apnea)   . Psoriasis   . Insomnia   . Lung cancer     s/p lobectomy, chemo and radiation therapy  . CAD (coronary artery disease)     s/p RCA atherectomy 1994     Family History  Problem Relation Age of Onset  . Heart attack Father   . Coronary artery disease Brother   . Lymphoma Brother   . Breast cancer Sister   . Diabetes Sister   . Heart disease Sister      History   Social History  . Marital Status: Married    Spouse Name: N/A    Number of Children: 3  . 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 Smoking status: Former Smoker -- 0.75 packs/day for 58 years    Types: Cigarettes    Quit date: 08/19/2012  . Smokeless tobacco: Former Neurosurgeon    Types: Chew    Quit date: 07/04/2012     Comment: started smoking at age 13.  Marland Kitchen Alcohol Use: Not on file  . Drug Use: Not on file  . Sexual Activity: Not on file   Other Topics  Concern  . Not on file   Social History Narrative  . No narrative on file     No Known Allergies   Outpatient Prescriptions Prior to Visit  Medication Sig Dispense Refill  . aspirin 81 MG tablet Take 81 mg by mouth daily.        . dabigatran (PRADAXA) 150 MG CAPS Take 150 mg by mouth every 12 (twelve) hours.        . dronedarone (MULTAQ) 400 MG tablet Take 400 mg by mouth 2 (two) times daily.        Marland Kitchen lisinopril (PRINIVIL,ZESTRIL) 5 MG tablet Take 5 mg by mouth daily.      Marland Kitchen omega-3 acid ethyl esters (LOVAZA) 1 G capsule Take 2 g by mouth daily.        . pantoprazole (PROTONIX) 40 MG tablet Take 40 mg by mouth daily.        . rosuvastatin (CRESTOR) 20 MG tablet Take 20 mg by mouth daily.        . traZODone (DESYREL) 150 MG tablet Take 150 mg by mouth at bedtime.      Marland Kitchen tiotropium (SPIRIVA HANDIHALER) 18 MCG inhalation capsule Place 1 capsule (18 mcg total) into inhaler and inhale daily.  30 capsule  6   No facility-administered medications prior to visit.       Review of Systems  Constitutional: Positive for fatigue. Negative for chills, diaphoresis, activity change, appetite change and unexpected weight change.  HENT: Positive for trouble swallowing and sinus pressure. Negative for hearing loss, nosebleeds, congestion, facial swelling, sneezing, mouth sores, neck stiffness, dental problem, voice change, postnasal drip, tinnitus and ear discharge.   Eyes: Negative for photophobia, discharge, itching and visual disturbance.  Respiratory: Positive for cough. Negative for apnea, choking, chest tightness and stridor.   Cardiovascular: Negative for palpitations.  Gastrointestinal: Negative for nausea, constipation, blood in stool and abdominal distention.  Genitourinary: Negative for dysuria, urgency, frequency, hematuria, flank pain, decreased urine volume and difficulty urinating.  Musculoskeletal: Negative for myalgias, back pain, joint swelling, arthralgias and gait problem.  Skin:  Negative for color change and pallor.  Neurological: Positive for tremors. Negative for dizziness, seizures, syncope, speech difficulty, weakness, light-headedness and numbness.  Hematological: Negative for adenopathy. Does not bruise/bleed easily.  Psychiatric/Behavioral: Positive for sleep disturbance and agitation. Negative for confusion. The patient is nervous/anxious.        Objective:   Physical Exam   Filed Vitals:   09/11/12 1557  BP: 134/76  Pulse: 56  Temp: 98.2 F (36.8 C)  TempSrc: Oral  Height: 5' 11.5" (1.816 m)  Weight: 209 lb (94.802 kg)  SpO2: 96%    Gen: Pleasant, well-nourished, in no distress,  normal affect  ENT: No lesions,  mouth clear,  oropharynx clear, no postnasal drip  Neck: No JVD, no TMG, no carotid bruits  Lungs: No use of accessory muscles, no dullness to percussion, improved BS  Cardiovascular: RRR, heart sounds normal, no murmur or gallops, no peripheral edema  Abdomen: soft and NT, no HSM,  BS normal  Musculoskeletal: No deformities, no cyanosis or clubbing  Neuro: alert, non focal  Skin: Warm, no lesions or rashes  No results found.     Assessment & Plan:   Hemoptysis, unspecified Hemoptysis on the basis of acute tracheobronchitis in the setting of the use of aspirin and Robaxin. No bronchoscopy 08/27/2012 negative for malignancy. PET scan also does not show malignancy recurrence.  Plan Stay on spiriva daily No further antibiotics No cancer recurrence Flu vaccine was given Focus on smoking cessation Return 2 months   Lung cancer History of lung cancer but without evidence of recurrence 09/23/2012  Obstructive chronic bronchitis without exacerbation Acute exacerbation of COPD with gold stage a now improved with therapy The patient was given 3-10 minutes of smoking cessation counseling Continued inhaled medications as prescribed  Nicotine addiction The patient was given 3-10 minutes of smoking cessation  counseling    Updated Medication List Outpatient Encounter Prescriptions as of 09/11/2012  Medication Sig Dispense Refill  . aspirin 81 MG tablet Take 81 mg by mouth daily.        . dabigatran (PRADAXA) 150 MG CAPS Take 150 mg by mouth every 12 (twelve) hours.        . dronedarone (MULTAQ) 400 MG tablet Take 400 mg by mouth 2 (two) times daily.        Marland Kitchen lisinopril (PRINIVIL,ZESTRIL) 5 MG tablet Take 5 mg by mouth daily.      Marland Kitchen omega-3 acid ethyl esters (LOVAZA) 1 G capsule Take 2 g by mouth daily.        . pantoprazole (PROTONIX) 40 MG tablet Take 40 mg by mouth daily.        . rosuvastatin (CRESTOR) 20 MG  tablet Take 20 mg by mouth daily.        . traZODone (DESYREL) 150 MG tablet Take 150 mg by mouth at bedtime.      Marland Kitchen tiotropium (SPIRIVA HANDIHALER) 18 MCG inhalation capsule Place 1 capsule (18 mcg total) into inhaler and inhale daily.  30 capsule  11  . [DISCONTINUED] tiotropium (SPIRIVA HANDIHALER) 18 MCG inhalation capsule Place 1 capsule (18 mcg total) into inhaler and inhale daily.  30 capsule  6   No facility-administered encounter medications on file as of 09/11/2012.

## 2012-09-11 NOTE — Patient Instructions (Addendum)
Stay on spiriva daily No further antibiotics No cancer recurrence Flu vaccine was given Focus on smoking cessation Return 2 months

## 2012-09-12 NOTE — Assessment & Plan Note (Signed)
History of lung cancer but without evidence of recurrence 09/23/2012

## 2012-09-12 NOTE — Assessment & Plan Note (Signed)
Hemoptysis on the basis of acute tracheobronchitis in the setting of the use of aspirin and Robaxin. No bronchoscopy 08/27/2012 negative for malignancy. PET scan also does not show malignancy recurrence.  Plan Stay on spiriva daily No further antibiotics No cancer recurrence Flu vaccine was given Focus on smoking cessation Return 2 months

## 2012-09-12 NOTE — Assessment & Plan Note (Signed)
The patient was given 3-10 minutes of smoking cessation counseling

## 2012-09-12 NOTE — Assessment & Plan Note (Signed)
Acute exacerbation of COPD with gold stage a now improved with therapy The patient was given 3-10 minutes of smoking cessation counseling Continued inhaled medications as prescribed

## 2012-09-22 LAB — FUNGUS CULTURE W SMEAR

## 2012-10-03 ENCOUNTER — Encounter: Payer: Self-pay | Admitting: Critical Care Medicine

## 2012-10-10 LAB — AFB CULTURE WITH SMEAR (NOT AT ARMC): Acid Fast Smear: NONE SEEN

## 2012-10-18 ENCOUNTER — Other Ambulatory Visit: Payer: Self-pay | Admitting: Cardiology

## 2012-10-25 ENCOUNTER — Other Ambulatory Visit: Payer: Self-pay | Admitting: Cardiology

## 2012-11-06 ENCOUNTER — Encounter: Payer: Self-pay | Admitting: Cardiology

## 2012-11-16 ENCOUNTER — Telehealth: Payer: Self-pay | Admitting: General Surgery

## 2012-11-16 ENCOUNTER — Encounter: Payer: Self-pay | Admitting: General Surgery

## 2012-11-16 DIAGNOSIS — Z79899 Other long term (current) drug therapy: Secondary | ICD-10-CM

## 2012-11-16 DIAGNOSIS — E785 Hyperlipidemia, unspecified: Secondary | ICD-10-CM

## 2012-11-16 NOTE — Telephone Encounter (Signed)
Letter sent to pt to make aware. Next labs for 05/16/13. Labs ordered and put on the lab schedule.

## 2012-11-16 NOTE — Telephone Encounter (Signed)
Message copied by Nita Sells on Thu Nov 16, 2012  9:13 AM ------      Message from: Armanda Magic R      Created: Wed Nov 15, 2012  6:09 PM       Lipids at goal ------

## 2012-11-23 ENCOUNTER — Encounter (HOSPITAL_COMMUNITY): Payer: Self-pay | Admitting: Interventional Cardiology

## 2012-11-23 ENCOUNTER — Ambulatory Visit (HOSPITAL_COMMUNITY): Payer: Medicare Other

## 2012-11-28 ENCOUNTER — Encounter: Payer: Self-pay | Admitting: Cardiology

## 2012-11-28 ENCOUNTER — Ambulatory Visit: Payer: Medicare Other | Admitting: Cardiology

## 2012-11-28 DIAGNOSIS — I2510 Atherosclerotic heart disease of native coronary artery without angina pectoris: Secondary | ICD-10-CM | POA: Insufficient documentation

## 2013-04-10 ENCOUNTER — Ambulatory Visit (HOSPITAL_BASED_OUTPATIENT_CLINIC_OR_DEPARTMENT_OTHER)
Admission: RE | Admit: 2013-04-10 | Discharge: 2013-04-10 | Disposition: A | Discharge status: Home / Self Care | Payer: Medicare Other | Source: Ambulatory Visit | Attending: Family Medicine | Admitting: Family Medicine

## 2013-04-10 ENCOUNTER — Other Ambulatory Visit (HOSPITAL_BASED_OUTPATIENT_CLINIC_OR_DEPARTMENT_OTHER): Payer: Self-pay | Admitting: Family Medicine

## 2013-04-10 DIAGNOSIS — M7989 Other specified soft tissue disorders: Secondary | ICD-10-CM

## 2013-04-10 DIAGNOSIS — R609 Edema, unspecified: Secondary | ICD-10-CM | POA: Insufficient documentation

## 2013-04-18 ENCOUNTER — Other Ambulatory Visit: Payer: Self-pay | Admitting: Cardiology

## 2013-04-18 MED ORDER — DABIGATRAN ETEXILATE MESYLATE 150 MG PO CAPS: ORAL_CAPSULE | ORAL | Status: DC

## 2013-05-14 ENCOUNTER — Ambulatory Visit (INDEPENDENT_AMBULATORY_CARE_PROVIDER_SITE_OTHER)
Admission: RE | Admit: 2013-05-14 | Discharge: 2013-05-14 | Disposition: A | Discharge status: Home / Self Care | Payer: Medicare Other | Source: Ambulatory Visit | Attending: Adult Health | Admitting: Adult Health

## 2013-05-14 ENCOUNTER — Encounter: Payer: Self-pay | Admitting: Adult Health

## 2013-05-14 ENCOUNTER — Telehealth: Payer: Self-pay | Admitting: Critical Care Medicine

## 2013-05-14 ENCOUNTER — Ambulatory Visit (INDEPENDENT_AMBULATORY_CARE_PROVIDER_SITE_OTHER): Payer: Medicare Other | Admitting: Adult Health

## 2013-05-14 ENCOUNTER — Encounter (HOSPITAL_COMMUNITY): Payer: Self-pay | Admitting: *Deleted

## 2013-05-14 ENCOUNTER — Inpatient Hospital Stay (HOSPITAL_COMMUNITY)
Admission: AD | Admit: 2013-05-14 | Discharge: 2013-05-17 | DRG: 194 | Disposition: A | Discharge status: Home / Self Care | Payer: Medicare Other | Source: Ambulatory Visit | Attending: Critical Care Medicine | Admitting: Critical Care Medicine

## 2013-05-14 VITALS — BP 118/58 | HR 97 | Temp 101.4°F

## 2013-05-14 DIAGNOSIS — Z7982 Long term (current) use of aspirin: Secondary | ICD-10-CM

## 2013-05-14 DIAGNOSIS — Z9221 Personal history of antineoplastic chemotherapy: Secondary | ICD-10-CM

## 2013-05-14 DIAGNOSIS — J449 Chronic obstructive pulmonary disease, unspecified: Secondary | ICD-10-CM

## 2013-05-14 DIAGNOSIS — Z923 Personal history of irradiation: Secondary | ICD-10-CM

## 2013-05-14 DIAGNOSIS — Z833 Family history of diabetes mellitus: Secondary | ICD-10-CM

## 2013-05-14 DIAGNOSIS — J440 Chronic obstructive pulmonary disease with acute lower respiratory infection: Secondary | ICD-10-CM | POA: Diagnosis present

## 2013-05-14 DIAGNOSIS — J45901 Unspecified asthma with (acute) exacerbation: Secondary | ICD-10-CM

## 2013-05-14 DIAGNOSIS — I480 Paroxysmal atrial fibrillation: Secondary | ICD-10-CM

## 2013-05-14 DIAGNOSIS — I10 Essential (primary) hypertension: Secondary | ICD-10-CM

## 2013-05-14 DIAGNOSIS — J4489 Other specified chronic obstructive pulmonary disease: Secondary | ICD-10-CM

## 2013-05-14 DIAGNOSIS — I4891 Unspecified atrial fibrillation: Secondary | ICD-10-CM

## 2013-05-14 DIAGNOSIS — J209 Acute bronchitis, unspecified: Secondary | ICD-10-CM | POA: Diagnosis present

## 2013-05-14 DIAGNOSIS — F17200 Nicotine dependence, unspecified, uncomplicated: Secondary | ICD-10-CM

## 2013-05-14 DIAGNOSIS — E785 Hyperlipidemia, unspecified: Secondary | ICD-10-CM

## 2013-05-14 DIAGNOSIS — G4733 Obstructive sleep apnea (adult) (pediatric): Secondary | ICD-10-CM

## 2013-05-14 DIAGNOSIS — Z807 Family history of other malignant neoplasms of lymphoid, hematopoietic and related tissues: Secondary | ICD-10-CM

## 2013-05-14 DIAGNOSIS — Z8249 Family history of ischemic heart disease and other diseases of the circulatory system: Secondary | ICD-10-CM

## 2013-05-14 DIAGNOSIS — C3490 Malignant neoplasm of unspecified part of unspecified bronchus or lung: Secondary | ICD-10-CM

## 2013-05-14 DIAGNOSIS — J441 Chronic obstructive pulmonary disease with (acute) exacerbation: Secondary | ICD-10-CM | POA: Diagnosis present

## 2013-05-14 DIAGNOSIS — Z9861 Coronary angioplasty status: Secondary | ICD-10-CM

## 2013-05-14 DIAGNOSIS — J189 Pneumonia, unspecified organism: Secondary | ICD-10-CM | POA: Insufficient documentation

## 2013-05-14 DIAGNOSIS — Z85118 Personal history of other malignant neoplasm of bronchus and lung: Secondary | ICD-10-CM

## 2013-05-14 DIAGNOSIS — Z79899 Other long term (current) drug therapy: Secondary | ICD-10-CM

## 2013-05-14 DIAGNOSIS — Z803 Family history of malignant neoplasm of breast: Secondary | ICD-10-CM

## 2013-05-14 DIAGNOSIS — Z8701 Personal history of pneumonia (recurrent): Secondary | ICD-10-CM

## 2013-05-14 DIAGNOSIS — I2510 Atherosclerotic heart disease of native coronary artery without angina pectoris: Secondary | ICD-10-CM

## 2013-05-14 LAB — CBC WITH DIFFERENTIAL/PLATELET
BASOS PCT: 0 % (ref 0–1)
Basophils Absolute: 0 10*3/uL (ref 0.0–0.1)
EOS ABS: 0 10*3/uL (ref 0.0–0.7)
Eosinophils Relative: 0 % (ref 0–5)
HEMATOCRIT: 43.6 % (ref 39.0–52.0)
HEMOGLOBIN: 14.4 g/dL (ref 13.0–17.0)
LYMPHS ABS: 1.7 10*3/uL (ref 0.7–4.0)
LYMPHS PCT: 12 % (ref 12–46)
MCH: 28 pg (ref 26.0–34.0)
MCHC: 33 g/dL (ref 30.0–36.0)
MCV: 84.8 fL (ref 78.0–100.0)
Monocytes Absolute: 1.4 10*3/uL — ABNORMAL HIGH (ref 0.1–1.0)
Monocytes Relative: 10 % (ref 3–12)
NEUTROS ABS: 11.1 10*3/uL — AB (ref 1.7–7.7)
Neutrophils Relative %: 78 % — ABNORMAL HIGH (ref 43–77)
Platelets: 155 10*3/uL (ref 150–400)
RBC: 5.14 MIL/uL (ref 4.22–5.81)
RDW: 13.8 % (ref 11.5–15.5)
WBC: 14.2 10*3/uL — ABNORMAL HIGH (ref 4.0–10.5)

## 2013-05-14 LAB — COMPREHENSIVE METABOLIC PANEL
ALT: 12 U/L (ref 0–53)
AST: 16 U/L (ref 0–37)
Albumin: 3.2 g/dL — ABNORMAL LOW (ref 3.5–5.2)
Alkaline Phosphatase: 77 U/L (ref 39–117)
BUN: 25 mg/dL — AB (ref 6–23)
CO2: 24 mEq/L (ref 19–32)
Calcium: 9 mg/dL (ref 8.4–10.5)
Chloride: 99 mEq/L (ref 96–112)
Creatinine, Ser: 1.35 mg/dL (ref 0.50–1.35)
GFR calc Af Amer: 58 mL/min — ABNORMAL LOW (ref >90)
GFR calc non Af Amer: 50 mL/min — ABNORMAL LOW (ref >90)
Glucose, Bld: 129 mg/dL — ABNORMAL HIGH (ref 70–99)
POTASSIUM: 4.8 meq/L (ref 3.7–5.3)
SODIUM: 137 meq/L (ref 137–147)
TOTAL PROTEIN: 7.2 g/dL (ref 6.0–8.3)
Total Bilirubin: 1.3 mg/dL — ABNORMAL HIGH (ref 0.3–1.2)

## 2013-05-14 LAB — PRO B NATRIURETIC PEPTIDE: PRO B NATRI PEPTIDE: 1192 pg/mL — AB (ref 0–125)

## 2013-05-14 LAB — LACTIC ACID, PLASMA: Lactic Acid, Venous: 1.3 mmol/L (ref 0.5–2.2)

## 2013-05-14 LAB — STREP PNEUMONIAE URINARY ANTIGEN: STREP PNEUMO URINARY ANTIGEN: NEGATIVE

## 2013-05-14 LAB — TROPONIN I: Troponin I: <0.3 ng/mL (ref <0.30)

## 2013-05-14 MED ORDER — SODIUM CHLORIDE 0.9 % IJ SOLN
3.0000 mL | Freq: Two times a day (BID) | INTRAMUSCULAR | Status: DC
  Administered 2013-05-16 (×2): 3 mL via INTRAVENOUS

## 2013-05-14 MED ORDER — ASPIRIN 81 MG PO TABS: 81.0000 mg | ORAL_TABLET | Freq: Every day | ORAL | Status: DC

## 2013-05-14 MED ORDER — LEVALBUTEROL HCL 0.63 MG/3ML IN NEBU
0.6300 mg | INHALATION_SOLUTION | Freq: Four times a day (QID) | RESPIRATORY_TRACT | Status: DC
  Administered 2013-05-14 – 2013-05-17 (×9): 0.63 mg via RESPIRATORY_TRACT
  Filled 2013-05-14 (×18): qty 3

## 2013-05-14 MED ORDER — DRONEDARONE HCL 400 MG PO TABS
400.0000 mg | ORAL_TABLET | Freq: Two times a day (BID) | ORAL | Status: DC
  Administered 2013-05-14 – 2013-05-16 (×4): 400 mg via ORAL
  Filled 2013-05-14 (×5): qty 1

## 2013-05-14 MED ORDER — SODIUM CHLORIDE 0.9 % IJ SOLN: 3.0000 mL | INTRAMUSCULAR | Status: DC | PRN

## 2013-05-14 MED ORDER — SODIUM CHLORIDE 0.9 % IV SOLN: 250.0000 mL | INTRAVENOUS | Status: DC | PRN

## 2013-05-14 MED ORDER — DEXTROSE 5 % IV SOLN
500.0000 mg | INTRAVENOUS | Status: DC
  Administered 2013-05-14 – 2013-05-16 (×3): 500 mg via INTRAVENOUS
  Filled 2013-05-14 (×4): qty 500

## 2013-05-14 MED ORDER — DABIGATRAN ETEXILATE MESYLATE 150 MG PO CAPS
150.0000 mg | ORAL_CAPSULE | Freq: Two times a day (BID) | ORAL | Status: DC
  Administered 2013-05-14 – 2013-05-17 (×6): 150 mg via ORAL
  Filled 2013-05-14 (×8): qty 1

## 2013-05-14 MED ORDER — ASPIRIN 81 MG PO CHEW
81.0000 mg | CHEWABLE_TABLET | Freq: Every day | ORAL | Status: DC
  Administered 2013-05-15 – 2013-05-17 (×3): 81 mg via ORAL
  Filled 2013-05-14 (×3): qty 1

## 2013-05-14 MED ORDER — DEXTROSE 5 % IV SOLN
1.0000 g | INTRAVENOUS | Status: DC
  Administered 2013-05-14 – 2013-05-16 (×3): 1 g via INTRAVENOUS
  Filled 2013-05-14 (×4): qty 10

## 2013-05-14 MED ORDER — PANTOPRAZOLE SODIUM 40 MG PO TBEC
40.0000 mg | DELAYED_RELEASE_TABLET | Freq: Every day | ORAL | Status: DC
  Administered 2013-05-15 – 2013-05-17 (×3): 40 mg via ORAL
  Filled 2013-05-14 (×3): qty 1

## 2013-05-14 MED ORDER — TIOTROPIUM BROMIDE MONOHYDRATE 18 MCG IN CAPS
18.0000 ug | ORAL_CAPSULE | Freq: Every day | RESPIRATORY_TRACT | Status: DC
  Administered 2013-05-15 – 2013-05-17 (×3): 18 ug via RESPIRATORY_TRACT
  Filled 2013-05-14: qty 5

## 2013-05-14 MED ORDER — TRAZODONE HCL 150 MG PO TABS
150.0000 mg | ORAL_TABLET | Freq: Every day | ORAL | Status: DC
  Administered 2013-05-14 – 2013-05-16 (×3): 150 mg via ORAL
  Filled 2013-05-14 (×4): qty 1

## 2013-05-14 MED ORDER — LEVALBUTEROL HCL 0.63 MG/3ML IN NEBU: 0.6300 mg | INHALATION_SOLUTION | RESPIRATORY_TRACT | Status: DC | PRN

## 2013-05-14 MED ORDER — ACETAMINOPHEN 325 MG PO TABS
650.0000 mg | ORAL_TABLET | Freq: Four times a day (QID) | ORAL | Status: DC | PRN
  Administered 2013-05-14: 650 mg via ORAL
  Filled 2013-05-14: qty 2

## 2013-05-14 MED ORDER — ATORVASTATIN CALCIUM 20 MG PO TABS
20.0000 mg | ORAL_TABLET | Freq: Every day | ORAL | Status: DC
  Administered 2013-05-15 – 2013-05-16 (×2): 20 mg via ORAL
  Filled 2013-05-14 (×3): qty 1

## 2013-05-14 MED ORDER — LISINOPRIL 5 MG PO TABS
5.0000 mg | ORAL_TABLET | Freq: Every day | ORAL | Status: DC
  Administered 2013-05-15 – 2013-05-17 (×3): 5 mg via ORAL
  Filled 2013-05-14 (×3): qty 1

## 2013-05-14 NOTE — Telephone Encounter (Signed)
Called spoke with pt. He is scheduled to come in and see TP this afternoon at 3:45. Nothing further needed

## 2013-05-14 NOTE — Progress Notes (Signed)
   Subjective:    Patient ID: Travis [name], male    DOB: Dec 06, 1940, 73 y.o.   MRN: [id number]  HPI 73 yo active smoker with known hx of COPD w/ asthmatic component  Hx of Lung cancer (sqaumous cell ) s/p LUL lobectomy/chemo/xrt  Hx of OSA -on CPAP  Hx of CAD, Atrial Fib on pradaxa   05/14/2013 Acute OV  Complains of  increased SOB, wheezing, chest tightness, prod cough with yellow/pink mucus x2 weeks.  Worse with , worse x2 days.  fever today at 101.4 Feels very weak today , wife noticed he was some what off balance and episode of confusion.  Temp on arrival to office 101.4.  No chest pain, arm weakness or speech/visual changes.  Admits to not taking spiriva as directed.  Restarted smoking . Advised on smoking cessation.   Had hemoptysis 08/2012 w/ FOB neg for malignancy and neg PET for recurrence .  Of note on ACE inhibitor denies daily cough.   Review of Systems Constitutional:   No  weight loss, night sweats,  Fevers, chills,  +fatigue, or  lassitude.  HEENT:   No headaches,  Difficulty swallowing,  Tooth/dental problems, or  Sore throat,                No sneezing, itching, ear ache,  +nasal congestion, post nasal drip,   CV:  No chest pain,  Orthopnea, PND, swelling in lower extremities, anasarca, dizziness, palpitations, syncope.   GI  No heartburn, indigestion, abdominal pain, nausea, vomiting, diarrhea, change in bowel habits, loss of appetite, bloody stools.   Resp:    No chest wall deformity  Skin: no rash or lesions.  GU: no dysuria, change in color of urine, no urgency or frequency.  No flank pain, no hematuria   MS:  No joint pain or swelling.  No decreased range of motion.  No back pain.  Psych:  No change in mood or affect. No depression or anxiety.  No memory loss.         Objective:   Physical Exam GEN: A/Ox3; pleasant , NAD, elderly , weak in w/c   HEENT:  [place]/AT,  EACs-clear, TMs-wnl, NOSE-clear, THROAT-clear, no lesions, no postnasal drip or  exudate noted.   NECK:  Supple w/ fair ROM; no JVD; normal carotid impulses w/o bruits; no thyromegaly or nodules palpated; no lymphadenopathy.  RESP  Coarse crackles bibasilar R>L  , no accessory muscle use, no dullness to percussion  CARD:  RRR, no m/r/g  ,tr  peripheral edema, pulses intact, no cyanosis or clubbing.  GI:   Soft & nt; nml bowel sounds; no organomegaly or masses detected.  Musco: Warm bil, no deformities or joint swelling noted.   Neuro: alert, no focal deficits noted.    Skin: Warm, no lesions or rashes         Assessment & Plan:

## 2013-05-14 NOTE — Assessment & Plan Note (Signed)
Admit  See H/P

## 2013-05-14 NOTE — Progress Notes (Signed)
Spoke with pt in regards to nighttime CPAP, states has brought home machine in with appropriate equipment. Prefers to self administer when ready. Placed sterile water at bedside for humidification and pt understands to call if any further assistance needed. RT will assist as needed.

## 2013-05-14 NOTE — Progress Notes (Signed)
Reviewed and agree.  He has fever, confusion, cough, sputum, increased dyspnea.  CXR shows patchy ASD at Rt base.  He will be admitted to [facility] telemetry unit and started on IV antibiotics.  Updated pt's wife about plan.

## 2013-05-14 NOTE — H&P (Signed)
Name: Travis [name] MRN: [id number] DOB: [birth year]    ADMISSION DATE:  (Not on file) PRIMARY SERVICE:  Pulmonary   CHIEF COMPLAINT:  Fever and cough   BRIEF PATIENT DESCRIPTION: 73 yo WM with COPD/AB smoker admitted from office with RLL PNA.   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  Complains of increased SOB, wheezing, chest tightness, prod cough with yellow/pink mucus x2 weeks.  Worse with , worse x2 days. fever today at 101.4  Feels very weak today , wife noticed he was some what off balance and episode of confusion.  Temp on arrival to office 101.4.  No chest pain, arm weakness or speech/visual changes.  Admits to not taking spiriva as directed.  Restarted smoking . Advised on smoking cessation.  Had hemoptysis 08/2012 w/ FOB neg for malignancy and neg PET for recurrence .  Of note on ACE inhibitor denies daily cough.  Will admit to [facility] tele bed for further tx  And evaluation.       PAST MEDICAL HISTORY :  Past Medical History  Diagnosis Date  . Dyslipidemia   . OSA (obstructive sleep apnea)     intolerant to CPAP  . Psoriasis   . Insomnia   . Lung cancer     s/p lobectomy, chemo and radiation therapy  . PAF (paroxysmal atrial fibrillation)   . Hypertension   . Hyperlipidemia   . CAD (coronary artery disease)     s/p RCA atherectomy 1994, cath 1999 with occluded RCA with left to right collaterals, PCI of LAD 2007, s/p PCI of LAD for instent thrombosis 2008   Past Surgical History  Procedure Laterality Date  . Cardiac catheterization  1999  . S/p lung  lobectomy  2007  . Multiple pct's to rca and lad  2007/2008  . Exploratory laparotomy      secondary to trauma  . Appendectomy    . Video bronchoscopy N/A 08/28/2012    Procedure: VIDEO BRONCHOSCOPY WITHOUT FLUORO;  Surgeon: Elsie Stain, MD;  Location: [place];  Service: Cardiopulmonary;  Laterality: N/A;   Prior to Admission medications   Medication Sig Start Date End Date Taking? Authorizing Provider  aspirin 81 MG tablet Take 81 mg by mouth daily.      Historical Provider, MD  dabigatran (PRADAXA) 150 MG CAPS capsule TAKE ONE CAPSULE BY MOUTH TWICE A DAY 04/18/13   Sueanne Margarita, MD  dronedarone (MULTAQ) 400 MG tablet Take 400 mg by mouth 2 (two) times daily.      Historical Provider, MD  lisinopril (PRINIVIL,ZESTRIL) 5 MG tablet Take 5 mg by mouth daily.    Historical Provider, MD  omega-3 acid ethyl esters (LOVAZA) 1 G capsule Take 1 g by mouth daily.     Historical Provider, MD  pantoprazole (PROTONIX) 40 MG tablet Take 40 mg by mouth daily.      Historical Provider, MD  rosuvastatin (CRESTOR) 20 MG tablet Take 10 mg by mouth daily.     Historical Provider, MD  tiotropium (SPIRIVA HANDIHALER) 18  MCG inhalation capsule Place 1 capsule (18 mcg total) into inhaler and inhale daily. 09/11/12   Elsie Stain, MD  traZODone (DESYREL) 150 MG tablet Take 150 mg by mouth at bedtime.    Historical Provider, MD   No Known Allergies  FAMILY HISTORY:  Family History  Problem Relation Age of Onset  . Heart attack Father   . Coronary artery disease Brother   . Lymphoma Brother   . Breast cancer Sister   . Diabetes Sister   . Heart disease Sister   . Heart disease Mother   . Heart attack Mother    SOCIAL HISTORY:  reports that he quit smoking about 8 months ago. His smoking use included Cigarettes. He has a 43.5 pack-year smoking history. He quit smokeless tobacco use about 10 months ago. His smokeless tobacco use included Chew. He reports that he does not drink alcohol or use  illicit drugs.  REVIEW OF SYSTEMS:   Constitutional: Negative for  ++fever, chills HENT: Negative for, ear pain, nosebleeds, congestion, sore throat, neck pain, tinnitus and ear discharge.   Eyes: Negative for blurred vision, double vision, photophobia, pain, discharge and redness.  Respiratory: ++ cough, , sputum production, shortness of breath, wheezing  Cardiovascular: Negative for chest pain, palpitations, orthopnea, claudication,  and PND.  Gastrointestinal: Negative for heartburn, nausea, vomiting, abdominal pain, diarrhea, constipation, blood in stool and melena.  Genitourinary: Negative for dysuria, urgency, frequency, hematuria and flank pain.  Musculoskeletal: Negative for myalgias, back pain, joint pain and falls.  Skin: Negative for itching and rash.  Neurological: Negative for dizziness, tingling, tremors, sensory change, speech change, focal weakness, seizures, loss of consciousness, headache  +weakness+confusion  Endo/Heme/Allergies: Negative for environmental allergies and polydipsia. Does not bruise/bleed easily.  SUBJECTIVE: feeling worse today , fever    VITAL SIGNS: Temp:  [101.4 F (38.6 C)] 101.4 F (38.6 C) (05/11 1558) Pulse Rate:  [97] 97 (05/11 1558) BP: (118)/(58) 118/58 mmHg (05/11 1558) SpO2:  [91 %] 91 % (05/11 1558)  PHYSICAL EXAMINATION: GEN: A/Ox3; pleasant , NAD, elderly , weak in w/c  HEENT: [place]/AT, EACs-clear, TMs-wnl, NOSE-clear, THROAT-clear, no lesions, no postnasal drip or exudate noted.  NECK: Supple w/ fair ROM; no JVD; normal carotid impulses w/o bruits; no thyromegaly or nodules palpated; no lymphadenopathy.  RESP Coarse crackles bibasilar R>L , no accessory muscle use, no dullness to percussion  CARD: RRR, no m/r/g ,tr peripheral edema, pulses intact, no cyanosis or clubbing.  GI: Soft & nt; nml bowel sounds; no organomegaly or masses detected.  Musco: Warm bil, no deformities or joint swelling noted.  Neuro: alert, no focal deficits  noted.  Skin: Warm, no lesions or rashes     No results found for this basename: NA, K, CL, CO2, BUN, CREATININE, GLUCOSE,  in the last 168 hours No results found for this basename: HGB, HCT, WBC, PLT,  in the last 168 hours Dg Chest 2 View  05/14/2013   CLINICAL DATA:  Cough, congestion, shortness of breath, fever.  EXAM: CHEST  2 VIEW  COMPARISON:  Prior radiograph from 08/17/2012  FINDINGS: The cardiac and mediastinal silhouettes are stable in size and contour, and remain within normal limits.  Emphysematous changes noted. Scattered surgical clips within the left perihilar region again noted, unchanged. Patchy infiltrate present within the right lung base, suspicious for possible pneumonia. No pulmonary edema or pleural effusion. No pneumothorax.  No acute osseous abnormality.  IMPRESSION: 1. Patchy infiltrate within the right lung base, suspicious for possible pneumonia.  2. Emphysema.   Electronically Signed   By: Jeannine Boga M.D.   On: 05/14/2013 16:25    ASSESSMENT / PLAN:  1. CAP -RLL Iniltrate  Plan  Admit Tele at [place] IV abx w/ rocephin and azithro Check labs with bnp, urine strep /legionella , lactate , bc x2  Sputum cx   2. COPD w/out flare  Plan  Cont Spiriva  Add Xopenex neb q6 and q2 As needed   Smoking cessatioin  Consider changing ACE if cough /wheezing worsen  Hold on steroids for now   3. Atrial Fib  Plan  Tele bed  Check EKG , 1 set of enzymes /bnp  Cont pradaxa and multaq   4. HTN  Plan  Cont home ACE .   5. OSA  Plan  Cont on CPAP  At bedtime    Melvenia Needles NP- C Pulmonary and Critical Care Medicine [facility] Pager: [phone]  05/14/2013, 4:51 PM   Reviewed, examined pt, and agree with above.   He has fever, confusion, cough, sputum, increased dyspnea. CXR shows patchy ASD at Rt base. He will be admitted to [facility] telemetry unit and started on IV antibiotics.   Updated pt's wife about plan.  Chesley Mires,  MD [facility] Pulmonary/Critical Care 05/14/2013, 5:13 PM Pager:  [phone] After 3pm call: [phone]

## 2013-05-14 NOTE — Patient Instructions (Signed)
Admit to hospital 

## 2013-05-15 ENCOUNTER — Inpatient Hospital Stay (HOSPITAL_COMMUNITY): Payer: Medicare Other

## 2013-05-15 DIAGNOSIS — J449 Chronic obstructive pulmonary disease, unspecified: Secondary | ICD-10-CM

## 2013-05-15 DIAGNOSIS — G4733 Obstructive sleep apnea (adult) (pediatric): Secondary | ICD-10-CM

## 2013-05-15 DIAGNOSIS — I4891 Unspecified atrial fibrillation: Secondary | ICD-10-CM

## 2013-05-15 LAB — CBC
HEMATOCRIT: 40.4 % (ref 39.0–52.0)
Hemoglobin: 13.4 g/dL (ref 13.0–17.0)
MCH: 28.2 pg (ref 26.0–34.0)
MCHC: 33.2 g/dL (ref 30.0–36.0)
MCV: 84.9 fL (ref 78.0–100.0)
Platelets: 155 10*3/uL (ref 150–400)
RBC: 4.76 MIL/uL (ref 4.22–5.81)
RDW: 13.8 % (ref 11.5–15.5)
WBC: 15.6 10*3/uL — ABNORMAL HIGH (ref 4.0–10.5)

## 2013-05-15 LAB — EXPECTORATED SPUTUM ASSESSMENT W REFEX TO RESP CULTURE

## 2013-05-15 LAB — BASIC METABOLIC PANEL
BUN: 23 mg/dL (ref 6–23)
CHLORIDE: 98 meq/L (ref 96–112)
CO2: 24 mEq/L (ref 19–32)
Calcium: 8.9 mg/dL (ref 8.4–10.5)
Creatinine, Ser: 1.14 mg/dL (ref 0.50–1.35)
GFR calc Af Amer: 71 mL/min — ABNORMAL LOW (ref >90)
GFR calc non Af Amer: 61 mL/min — ABNORMAL LOW (ref >90)
GLUCOSE: 130 mg/dL — AB (ref 70–99)
POTASSIUM: 3.8 meq/L (ref 3.7–5.3)
Sodium: 136 mEq/L — ABNORMAL LOW (ref 137–147)

## 2013-05-15 LAB — LEGIONELLA ANTIGEN, URINE: Legionella Antigen, Urine: NEGATIVE

## 2013-05-15 LAB — HIV ANTIBODY (ROUTINE TESTING W REFLEX): HIV 1&2 Ab, 4th Generation: NONREACTIVE

## 2013-05-15 MED ORDER — METOPROLOL TARTRATE 25 MG PO TABS
25.0000 mg | ORAL_TABLET | Freq: Once | ORAL | Status: AC
  Administered 2013-05-15: 25 mg via ORAL
  Filled 2013-05-15: qty 1

## 2013-05-15 MED ORDER — METOPROLOL TARTRATE 1 MG/ML IV SOLN
2.5000 mg | Freq: Once | INTRAVENOUS | Status: AC
  Administered 2013-05-15: 2.5 mg via INTRAVENOUS
  Filled 2013-05-15: qty 5

## 2013-05-15 MED ORDER — METOPROLOL TARTRATE 25 MG PO TABS
25.0000 mg | ORAL_TABLET | Freq: Two times a day (BID) | ORAL | Status: DC
  Administered 2013-05-16: 25 mg via ORAL
  Filled 2013-05-15 (×3): qty 1

## 2013-05-15 MED ORDER — METOPROLOL TARTRATE 1 MG/ML IV SOLN
2.5000 mg | INTRAVENOUS | Status: DC | PRN
  Administered 2013-05-15 – 2013-05-16 (×6): 5 mg via INTRAVENOUS
  Filled 2013-05-15 (×6): qty 5

## 2013-05-15 NOTE — Care Management Note (Addendum)
    Page 1 of 1   05/17/2013     1:47:25 PM CARE MANAGEMENT NOTE 05/17/2013  Patient:  Travis [name], Travis [name]   Account Number:  [id number]  Date Initiated:  05/15/2013  Documentation initiated by:  Dessa Phi  Subjective/Objective Assessment:   73 Y/O M ADMITTED W/RLL PNA.     Action/Plan:   FROM HOME.HAS PCP,PHARMACY.HAS CPAP HS.   Anticipated DC Date:  05/17/2013   Anticipated DC Plan:  [place]  CM consult      Choice offered to / List presented to:             Status of service:  Completed, signed off Medicare Important Message given?  NA - LOS <3 / Initial given by admissions (If response is "NO", the following Medicare IM given date fields will be blank) Date Medicare IM given:  05/17/2013 Date Additional Medicare IM given:    Discharge Disposition:  HOME/SELF CARE  Per UR Regulation:  Reviewed for med. necessity/level of care/duration of stay  If discussed at [place] of Stay Meetings, dates discussed:    Comments:  05/17/13 Jaquay Posthumus RN,BSN NCM [phone] D/C HOME [address].  05/15/13 Jermesha Sottile RN,BSN NCM [phone]

## 2013-05-15 NOTE — Progress Notes (Signed)
Patient remains tachycardic 150's, notified MD and new orders given. Will continue to monitor. J.Khole Branch, RN

## 2013-05-15 NOTE — Progress Notes (Signed)
Name: Travis [name] MRN: [id number] DOB: 1940/12/04    ADMISSION DATE:  05/14/2013 PRIMARY SERVICE:  Pulmonary   CHIEF COMPLAINT:  Fever and cough   BRIEF PATIENT DESCRIPTION: 73 yo WM with COPD/AB smoker admitted from office with acute bronchitis & COPD flare   SIGNIFICANT EVENTS / STUDIES:  5/11 - Admit from office for CAP / RLL infiltrate  5/12 - Episode of Afib with RVR  LINES / TUBES:  CULTURES: Sputum 5/11 >> BCx2 5/12 >> Lactic Acid 5/11 >> 1.3 U. Strep 5/11 >> neg  ANTIBIOTICS: 5/11 Azithro>> 5/11 Rocephin >>  SUBJECTIVE: RN reports afib with RVR - rate 140-170's with activity, HR 120-130's at rest. tmax 101.6.  Pt reports feeling much better than yesterday.  Denies palpitations, pre-syncope, chest pain, SOB at rest.  Has not been on oxygen, no set up in room.    VITAL SIGNS: Temp:  [98.7 F (37.1 C)-101.6 F (38.7 C)] 99.1 F (37.3 C) (05/12 0512) Pulse Rate:  [73-97] 76 (05/12 0512) Resp:  [18-19] 18 (05/12 0512) BP: (118-152)/(58-84) 152/84 mmHg (05/12 0512) SpO2:  [91 %-100 %] 100 % (05/12 0512) Weight:  [205 lb 4 oz (93.1 kg)] 205 lb 4 oz (93.1 kg) (05/11 1900)  PHYSICAL EXAMINATION: GEN: A/Ox3; pleasant , NAD, elderly  HEENT: [place]/AT, mm pink/moist, no jvd RESP: even, non-labored, lungs bilaterally coarse, reduced crackles, no accessory muscle use  CARD: RRR, no m/r/g, no edema, pulses intact, no cyanosis or clubbing.  GI: Soft & nt; nml bowel sounds; no organomegaly or masses detected.  Musco: Warm bil, no deformities or joint swelling noted.  Neuro: alert, no focal deficits noted.  Skin: Warm, no lesions or rashes.  Thick toenails.      Recent Labs Lab 05/14/13 1824 05/15/13 0429  NA 137 136*  K 4.8 3.8  CL 99 98  CO2 24 24  BUN 25* 23  CREATININE 1.35 1.14  GLUCOSE 129* 130*    Recent Labs Lab 05/14/13 1824 05/15/13 0429  HGB 14.4 13.4  HCT 43.6 40.4  WBC 14.2* 15.6*  PLT 155 155   Dg Chest 2 View  05/14/2013   CLINICAL DATA:   Cough, congestion, shortness of breath, fever.  EXAM: CHEST  2 VIEW  COMPARISON:  Prior radiograph from 08/17/2012  FINDINGS: The cardiac and mediastinal silhouettes are stable in size and contour, and remain within normal limits.  Emphysematous changes noted. Scattered surgical clips within the left perihilar region again noted, unchanged. Patchy infiltrate present within the right lung base, suspicious for possible pneumonia. No pulmonary edema or pleural effusion. No pneumothorax.  No acute osseous abnormality.  IMPRESSION: 1. Patchy infiltrate within the right lung base, suspicious for possible pneumonia. 2. Emphysema.   Electronically Signed   By: Jeannine Boga M.D.   On: 05/14/2013 16:25    ASSESSMENT / PLAN:  CAP - RLL Iniltrate suggested on initial film but 5/12 film clear Plan  IV abx w/ rocephin and azithro Follow cultures as above Follow SpO2 Oxygen to support sats 90-94%  COPD w/out flare  Plan  Cont Spiriva  Xopenex neb q6 and q2 As needed   Smoking cessation Consider changing ACE if cough /wheezing worsen  Hold on steroids for now   Atrial Fib - on multaq & pradaxa at baseline, episode of RVR on 5/12, BNP 1192.  Likely related to  Plan  Continue telemetry Negative troponin Cont pradaxa and multaq  Fever control Lopressor 2.5mg  IV x1   HTN  Plan  Cont  home ACE   OSA  Plan  CPAP at bedtime + PRN sleep     Noe Gens, NP-C [facility] Pgr: [phone] or [phone]  Independently examined pt, evaluated data & formulated above care plan with NP who scribed this note & edited by me.  Leanna Sato AlvaMD  05/15/2013, 8:19 AM

## 2013-05-15 NOTE — Progress Notes (Signed)
[facility] Progress Note Patient Name: MONTRICE GRACEY DOB: [birth year] MRN: [id number]  Date of Service  05/15/2013   HPI/Events of Note  Remains tachycardic - briefly improves after bolused metoprolol   eICU Interventions  Add Po metoprolol to the IV doses, follow HR and BP   Intervention Category Intermediate Interventions: Arrhythmia - evaluation and management  Collene Gobble 05/15/2013, 6:12 PM

## 2013-05-16 ENCOUNTER — Inpatient Hospital Stay (HOSPITAL_COMMUNITY): Payer: Medicare Other

## 2013-05-16 ENCOUNTER — Other Ambulatory Visit: Payer: Medicare Other

## 2013-05-16 DIAGNOSIS — I2510 Atherosclerotic heart disease of native coronary artery without angina pectoris: Secondary | ICD-10-CM

## 2013-05-16 MED ORDER — DILTIAZEM HCL 100 MG IV SOLR
5.0000 mg/h | INTRAVENOUS | Status: DC
  Administered 2013-05-16: 5 mg/h via INTRAVENOUS
  Filled 2013-05-16: qty 100

## 2013-05-16 MED ORDER — BISOPROLOL FUMARATE 5 MG PO TABS
5.0000 mg | ORAL_TABLET | Freq: Every day | ORAL | Status: DC
  Filled 2013-05-16: qty 1

## 2013-05-16 MED ORDER — DILTIAZEM LOAD VIA INFUSION
10.0000 mg | Freq: Once | INTRAVENOUS | Status: AC
  Administered 2013-05-16: 10 mg via INTRAVENOUS
  Filled 2013-05-16: qty 10

## 2013-05-16 NOTE — Progress Notes (Signed)
Called by nursing staff. Pt on Cardizem drip and now having 5-7 second pauses. I have stopped Multaq. Will stop Cardizem drip now. He is in sinus brady. Will follow on telemetry today. Will need to decide in am appropriate home regimen. May need EP consult or close EP follow up.   Burnell Blanks 05/16/2013 2:04 PM

## 2013-05-16 NOTE — Progress Notes (Signed)
Patient had two pauses, 6.94 and 7.07 seconds, back to back.  Patient was symptomatic for these pauses.  He stated "I was sitting here reading my book and all of a sudden I felt really bad, I thought I was going to pass out.  My face got really hot and it really scared me. I haven't felt this bad in awhile.  I was actually feeling pretty good this morning too." VS were taken. BP 116/50, pulse 50, oxygen sat 95% on RA, resp 20.  Cardiologist paged with findings.  New orders received to d/c Cardizem gtt.  Gtt was d/c at 1400. Will continue to monitor patient closely.  Horton Chin, Therapist, sports.

## 2013-05-16 NOTE — Consult Note (Addendum)
Patient ID: Travis [name] MRN: [id number] DOB/AGE: [birth year] 73 y.o.  Admit date: 05/14/2013 Referring Physician: Elsworth Soho Primary Cardiologist: Fransico Him, MD Reason for Consultation: Atrial fib with RVR  HPI: 73 yo male with history of HTN, HLD, COPD, ongoing tobacco abuse, OSA, lung cancer, CAD, atrial fibrillation on Pradaxa admitted with dyspnea, cough, fever c/w COPD exacerbation/pneumonia. He is being managed by the Pulm/CCM service. Much improved from a pulmonary standpoint today with improvement in dyspnea but heart rate has been hard to control. Telemetry reviewed and shows atrial fibrillation with HR of 130-160 bpm. He has no c/o chest pain or palpitations. Known to have CAD with prior PCI. His atrial fibrillation has been managed chronically by Dr. Radford Pax with Multaq BID and Pradaxa for anti-coagulation. No current complaints.    Past Medical History  Diagnosis Date  . Dyslipidemia   . OSA (obstructive sleep apnea)     intolerant to CPAP  . Psoriasis   . Insomnia   . Lung cancer     s/p lobectomy, chemo and radiation therapy  . PAF (paroxysmal atrial fibrillation)   . Hypertension   . Hyperlipidemia   . CAD (coronary artery disease)     s/p RCA atherectomy 1994, cath 1999 with occluded RCA with left to right collaterals, PCI of LAD 2007, s/p PCI of LAD for instent thrombosis 2008    Family History  Problem Relation Age of Onset  . Heart attack Father   . Coronary artery disease Brother   . Lymphoma Brother   . Breast cancer Sister   . Diabetes Sister   . Heart disease Sister   . Heart disease Mother   . Heart attack Mother     History   Social History  . Marital Status: Married    Spouse Name: N/A    Number of Children: 3  . Years of Education: N/A   Occupational History  . Truck Geophysicist/field seismologist    Social History Main Topics  . Smoking status: Current Every Day Smoker -- 0.75 packs/day for 58 years    Types: Cigarettes  . Smokeless tobacco: Former Systems developer      Types: Chew    Quit date: 07/04/2012     Comment: started smoking at age 40.  Marland Kitchen Alcohol Use: No  . Drug Use: No  . Sexual Activity: Not on file   Other Topics Concern  . Not on file   Social History Narrative  . No narrative on file    Past Surgical History  Procedure Laterality Date  . Cardiac catheterization  1999  . S/p lung lobectomy  2007  . Multiple pct's to rca and lad  2007/2008  . Exploratory laparotomy      secondary to trauma  . Appendectomy    . Video bronchoscopy N/A 08/28/2012    Procedure: VIDEO BRONCHOSCOPY WITHOUT FLUORO;  Surgeon: Elsie Stain, MD;  Location: [place];  Service: Cardiopulmonary;  Laterality: N/A;    No Known Allergies  Prescriptions prior to admission  Medication Sig Dispense Refill  . aspirin 81 MG tablet Take 81 mg by mouth daily.        . dabigatran (PRADAXA) 150 MG CAPS capsule Take 150 mg by mouth 2 (two) times daily.      Marland Kitchen dronedarone (MULTAQ) 400 MG tablet Take 400 mg by mouth 2 (two) times daily.       Marland Kitchen HYDROcodone-homatropine (HYCODAN) 5-1.5 MG/5ML syrup Take 5 mLs by mouth every 6 (six) hours as needed  for cough.      Marland Kitchen lisinopril (PRINIVIL,ZESTRIL) 5 MG tablet Take 5 mg by mouth daily.      Marland Kitchen omega-3 acid ethyl esters (LOVAZA) 1 G capsule Take 1 g by mouth daily.       . pantoprazole (PROTONIX) 40 MG tablet Take 40 mg by mouth daily.        . rosuvastatin (CRESTOR) 10 MG tablet Take 10 mg by mouth daily.      Marland Kitchen tiotropium (SPIRIVA HANDIHALER) 18 MCG inhalation capsule Place 1 capsule (18 mcg total) into inhaler and inhale daily.  30 capsule  11  . traZODone (DESYREL) 150 MG tablet Take 150 mg by mouth at bedtime.        Review of systems complete and found to be negative unless listed above    Physical Exam: Blood pressure 102/58, pulse 142, temperature 98.3 F (36.8 C), temperature source Axillary, resp. rate 18, height 6' (1.829 m), weight 205 lb 4 oz (93.1 kg), SpO2 93.00%.    General: Well developed, well  nourished, NAD  HEENT: OP clear, mucus membranes moist  SKIN: warm, dry. No rashes.  Neuro: No focal deficits  Musculoskeletal: Muscle strength 5/5 all ext  Psychiatric: Mood and affect normal  Neck: No JVD, no carotid bruits, no thyromegaly, no lymphadenopathy.  Lungs:Coarse BS bilaterally.   Cardiovascular: Irregular irregular, tachy.   Abdomen:Soft. Bowel sounds present. Non-tender.  Extremities: No lower extremity edema. Pulses are 2 + in the bilateral DP/PT.  Labs:   Lab Results  Component Value Date   WBC 15.6* 05/15/2013   HGB 13.4 05/15/2013   HCT 40.4 05/15/2013   MCV 84.9 05/15/2013   PLT 155 05/15/2013    Recent Labs Lab 05/14/13 1824 05/15/13 0429  NA 137 136*  K 4.8 3.8  CL 99 98  CO2 24 24  BUN 25* 23  CREATININE 1.35 1.14  CALCIUM 9.0 8.9  PROT 7.2  --   BILITOT 1.3*  --   ALKPHOS 77  --   ALT 12  --   AST 16  --   GLUCOSE 129* 130*   Lab Results  Component Value Date   CKTOTAL 166 05/05/2009   CKMB 4.9* 05/05/2009   TROPONINI <0.30 05/14/2013     Chest x-ray:  Lungs are adequately inflated with surgical clips over the left  midlung. There is no definite focal consolidation or effusion.  Stable borderline cardiomegaly. Remainder the exam is unchanged.  IMPRESSION:  No acute cardiopulmonary disease.  EKG: Pending this am. No EKG in EPIC or on chart.   ASSESSMENT AND PLAN:   1. Atrial fib with RVR: Pt with known atrial fibrillation managed chronically by Dr. Radford Pax. He has been on Multaq as an outpatient. Now in setting of acute pulmonary illness with elevated HR. I have discussed the case with the EP team. Would continue Pradaxa for anti-coagulation. Would discontinue Multaq. D/C prn dosing of beta blocker and scheduled beta blocker. Will start Cardizem drip at 5 mg/hour. May titrate to 15 mg/hour as needed for HR control if BP tolerates. HR will be easier to control as acute pulmonary process improves. We will follow with you. May need to involve EP team  for formal consultation if unable to control HR with Diltiazem.   2. CAD: Stable. No changes.   3. COPD exacerbation: Per PCCM  Signed: Burnell Blanks, MD 05/16/2013, 10:56 AM

## 2013-05-16 NOTE — Progress Notes (Signed)
Patient remained tachycardic for the majority of the night. Given PRN Metoprolol x3 IV to try to decrease HR per MD order. Rhythm continuously changed from irregular atrial fibrillation to SVT and sometimes momentary NSR. Notified of 2 significant pauses. Paged on call pulmonologist, no new orders were given. Since BP has decreased, advised to wait to give next PRN metoprolol. Also advised to seek a cardiology consult today. Will pass along information to oncoming nurse. Patient has been asymptomatic the entire night. Rested comfortably. Will continue to monitor closely. Blanchard Kelch, RN

## 2013-05-16 NOTE — Progress Notes (Signed)
Cardizem gtt started at 1200. Less than an hour later, patient had three separate pauses: 4.74. 5.53, and 4.80 seconds. Patient was assessed during this time and was asymptomatic sitting up in the chair reading his book.VS obtained- BP stable at 119/98.  He stated "I feel fine."  Cardiologist was paged at this time.  No new orders received.  Will continue to monitor patient.  Horton Chin, Therapist, sports.

## 2013-05-16 NOTE — Progress Notes (Signed)
Name: Travis [name] MRN: [id number] DOB: 19-Aug-1940    ADMISSION DATE:  05/14/2013 PRIMARY SERVICE:  Pulmonary   CHIEF COMPLAINT:  Fever and cough   BRIEF PATIENT DESCRIPTION: 73 yo WM with COPD/AB smoker admitted from office with acute bronchitis & COPD flare   SIGNIFICANT EVENTS / STUDIES:  5/11 - Admit from office for CAP / RLL infiltrate  5/12 - Episode of Afib with RVR 5/13 - Afib with rate consistently 130-160's at rest despite lopressor  CULTURES: Sputum 5/11 >> BCx2 5/12 >> Lactic Acid 5/11 >> 1.3 U. Strep 5/11 >> neg  ANTIBIOTICS: 5/11 Azithro>> 5/11 Rocephin >>  SUBJECTIVE: Noted afib with RVR overnight with lopressor IV & PO initiated - rate 140-170's with activity, HR 120-130's at rest. Patient pushing to go home.    VITAL SIGNS: Temp:  [98.2 F (36.8 C)-101.6 F (38.7 C)] 98.3 F (36.8 C) (05/13 0402) Pulse Rate:  [72-145] 142 (05/13 0550) Resp:  [18-19] 18 (05/13 0402) BP: (102-136)/(57-80) 102/58 mmHg (05/13 0550) SpO2:  [93 %-98 %] 93 % (05/13 0402)  PHYSICAL EXAMINATION: GEN: A/Ox3; pleasant , NAD, elderly  HEENT: [place]/AT, mm pink/moist, no jvd RESP: even, non-labored, lungs bilaterally coarse, reduced crackles, no accessory muscle use  CARD: RRR, no m/r/g, no edema, pulses intact, no cyanosis or clubbing.  GI: Soft & nt; nml bowel sounds; no organomegaly or masses detected.  Musco: Warm bil, no deformities or joint swelling noted.  Neuro: alert, no focal deficits noted.  Skin: Warm, no lesions or rashes.  Thick toenails.      Recent Labs Lab 05/14/13 1824 05/15/13 0429  NA 137 136*  K 4.8 3.8  CL 99 98  CO2 24 24  BUN 25* 23  CREATININE 1.35 1.14  GLUCOSE 129* 130*    Recent Labs Lab 05/14/13 1824 05/15/13 0429  HGB 14.4 13.4  HCT 43.6 40.4  WBC 14.2* 15.6*  PLT 155 155   Dg Chest 2 View  05/15/2013   CLINICAL DATA:  Evaluate pneumonia.  EXAM: CHEST  2 VIEW  COMPARISON:  05/14/2013 and 08/17/2012  FINDINGS: Lungs are  adequately inflated with surgical clips over the left midlung. There is no definite focal consolidation or effusion. Stable borderline cardiomegaly. Remainder the exam is unchanged.  IMPRESSION: No acute cardiopulmonary disease.   Electronically Signed   By: Marin Olp M.D.   On: 05/15/2013 08:26   Dg Chest 2 View  05/14/2013   CLINICAL DATA:  Cough, congestion, shortness of breath, fever.  EXAM: CHEST  2 VIEW  COMPARISON:  Prior radiograph from 08/17/2012  FINDINGS: The cardiac and mediastinal silhouettes are stable in size and contour, and remain within normal limits.  Emphysematous changes noted. Scattered surgical clips within the left perihilar region again noted, unchanged. Patchy infiltrate present within the right lung base, suspicious for possible pneumonia. No pulmonary edema or pleural effusion. No pneumothorax.  No acute osseous abnormality.  IMPRESSION: 1. Patchy infiltrate within the right lung base, suspicious for possible pneumonia. 2. Emphysema.   Electronically Signed   By: Jeannine Boga M.D.   On: 05/14/2013 16:25    ASSESSMENT / PLAN:  CAP - RLL Iniltrate suggested on initial film but 5/12 film clear Plan  IV abx w/ rocephin and azithro, likely can transition to oral agent soon Follow cultures as above Follow SpO2 Oxygen to support sats 90-94%  COPD w/out flare  Plan  Cont Spiriva  Xopenex neb q6 and q2 As needed   Smoking cessation Consider changing ACE  if cough /wheezing worsen  Hold on steroids for now   Atrial Fib - on multaq & pradaxa at baseline, episode of RVR on 5/12, BNP 1192.  Likely related to HCAP / COPD.  Plan  Continue telemetry Negative troponin Cont pradaxa and multaq  Fever control PRN IV lopressor  Change oral lopressor to bisoprolol  (prefer since cardioselective) Followed by Dr. Radford Pax, have asked Cardiology to evaluate   HTN  Plan  Cont home ACE   OSA  Plan  CPAP at bedtime + PRN sleep     Noe Gens, NP-C [facility] Pgr: [phone] or [phone]  Independently examined pt, evaluated data & formulated above care plan with NP who scribed this note & edited by me.  Rigoberto Noel MD  05/16/2013, 10:33 AM

## 2013-05-16 NOTE — Progress Notes (Signed)
Patient has been tachycardic since start of my shift at 0700 and throughout last night.  Patient is asymptomatic sitting up in the chair.  He states "I've been dealing with this for about 5 years. Sometimes I can tell if my heart changes rhythms." Wife at bedside confirms and says "sometimes at night he will check his pulse when he feels it happening."  His rhythm keeps changing continuously from a.fib to SVT to a.fib and back and forth with HR ranging from 150s-170s. IV prn Metoprolol 5mg  given at 0837. BP stable 132/66. HR soon after dropped to 120s-140s.  Will continue to monitor patient closely.

## 2013-05-17 ENCOUNTER — Telehealth: Payer: Self-pay | Admitting: Critical Care Medicine

## 2013-05-17 DIAGNOSIS — F17200 Nicotine dependence, unspecified, uncomplicated: Secondary | ICD-10-CM

## 2013-05-17 LAB — BASIC METABOLIC PANEL
BUN: 19 mg/dL (ref 6–23)
CO2: 26 meq/L (ref 19–32)
CREATININE: 1.12 mg/dL (ref 0.50–1.35)
Calcium: 8.5 mg/dL (ref 8.4–10.5)
Chloride: 101 mEq/L (ref 96–112)
GFR calc Af Amer: 73 mL/min — ABNORMAL LOW (ref >90)
GFR, EST NON AFRICAN AMERICAN: 63 mL/min — AB (ref >90)
GLUCOSE: 156 mg/dL — AB (ref 70–99)
Potassium: 3.8 mEq/L (ref 3.7–5.3)
Sodium: 140 mEq/L (ref 137–147)

## 2013-05-17 LAB — CULTURE, RESPIRATORY W GRAM STAIN: Culture: NORMAL

## 2013-05-17 LAB — CBC
HCT: 37.9 % — ABNORMAL LOW (ref 39.0–52.0)
Hemoglobin: 12.3 g/dL — ABNORMAL LOW (ref 13.0–17.0)
MCH: 27.7 pg (ref 26.0–34.0)
MCHC: 32.5 g/dL (ref 30.0–36.0)
MCV: 85.4 fL (ref 78.0–100.0)
PLATELETS: 179 10*3/uL (ref 150–400)
RBC: 4.44 MIL/uL (ref 4.22–5.81)
RDW: 13.8 % (ref 11.5–15.5)
WBC: 10.9 10*3/uL — AB (ref 4.0–10.5)

## 2013-05-17 LAB — CULTURE, RESPIRATORY

## 2013-05-17 MED ORDER — CEFDINIR 300 MG PO CAPS
300.0000 mg | ORAL_CAPSULE | Freq: Two times a day (BID) | ORAL | Status: DC
Start: 2013-05-17 — End: 2013-08-14

## 2013-05-17 MED ORDER — HYDROCOD POLST-CHLORPHEN POLST 10-8 MG/5ML PO LQCR: 5.0000 mL | Freq: Two times a day (BID) | ORAL | Status: DC | PRN

## 2013-05-17 MED ORDER — LEVALBUTEROL TARTRATE 45 MCG/ACT IN AERO: 2.0000 | INHALATION_SPRAY | RESPIRATORY_TRACT | Status: DC | PRN

## 2013-05-17 MED ORDER — ALBUTEROL SULFATE HFA 108 (90 BASE) MCG/ACT IN AERS: 2.0000 | INHALATION_SPRAY | RESPIRATORY_TRACT | Status: DC | PRN

## 2013-05-17 NOTE — Discharge Summary (Signed)
Physician Discharge Summary       Patient ID: Travis [name] MRN: [id number] DOB/AGE: [birth year] 75 y.o.  Admit date: 05/14/2013 Discharge date: 05/17/2013  Discharge Diagnoses:   Detailed Hospital Course:  73 yo active smoker with known hx of COPD w/ asthmatic component w/ Hx of Lung cancer (sqaumous cell ) s/p LUL lobectomy/chemo/xrt,Hx of OSA -on CPAP, Hx of CAD, Atrial Fib on pradaxa. Presented to office on 05/14/2013 for an acute OV . Complained of increased SOB, wheezing, chest tightness, prod cough with yellow/pink mucus x2 weeks, worse x2 days. fever in office at 101.4. Admitted for further eval and therapy. Dx eval demonstrated RLL pneumonia. Was treated w/ supplemental oxygen, bronchodilators, and empiric antibiotics. Urine antigens were negative. Culture data was un-reviling. His course was complicated by: atrial fib with RVR, first noted on 5/12 and worse on 5/13 w/ persistent HR in 130-160s. Cardiology was consulted. Was initially placed on cardiazem gtt. He converted back to NSR. Ultimately it was felt that the RVR was related to the stress of his acute pulmonary illness. They felt he should go home on his MUTLTAQ and Pradaxa. With follow up in 2-3 weeks. He was clear to d/c to home as of 5/14 with the plan as outlined below.   Discharge Plan by diagnoses  RLL CAP (NOS)  Home w/ oral omnicef, will complete total 10d  F/u 1 week our office  COPD w/out flare   Cont Spiriva prn xoepnex   Smoking cessation   Atrial Fib - on multaq & pradaxa at baseline,  Cont pradaxa and multaq   F/u with cards     Significant Hospital tests/ studies/ interventions and procedures  Consults: cardiology   CULTURES:  Sputum 5/11 >> NF BCx2 5/12 >>  Lactic Acid 5/11 >> 1.3  U. Strep 5/11 >> neg  ANTIBIOTICS:  5/11 Azithro>> 5/14 5/11 Rocephin >> 5/14 5/14 omnicef>>> ( 7 more days)   Discharge Exam: BP 117/97  Pulse 62  Temp(Src) 97.6 F (36.4 C) (Oral)  Resp 18  Ht 6' (1.829 m)   Wt 93.1 kg (205 lb 4 oz)  BMI 27.83 kg/m2  SpO2 98% Room air  GEN: A/Ox3; pleasant , NAD, elderly  HEENT: [place]/AT, mm pink/moist, no jvd  RESP: even, non-labored, scattered rhonchi exp wheeze  CARD: RRR, no m/r/g, no edema, pulses intact, no cyanosis or clubbing.  GI: Soft & nt; nml bowel sounds; no organomegaly or masses detected.  Musco: Warm bil, no deformities or joint swelling noted.  Neuro: alert, no focal deficits noted.  Skin: Warm, no lesions or rashes. Thick toenails.    Labs at discharge Lab Results  Component Value Date   CREATININE 1.12 05/17/2013   BUN 19 05/17/2013   NA 140 05/17/2013   K 3.8 05/17/2013   CL 101 05/17/2013   CO2 26 05/17/2013   Lab Results  Component Value Date   WBC 10.9* 05/17/2013   HGB 12.3* 05/17/2013   HCT 37.9* 05/17/2013   MCV 85.4 05/17/2013   PLT 179 05/17/2013   Lab Results  Component Value Date   ALT 12 05/14/2013   AST 16 05/14/2013   ALKPHOS 77 05/14/2013   BILITOT 1.3* 05/14/2013   Lab Results  Component Value Date   INR 1.4* 08/17/2012   INR 2.43* 05/06/2009   INR 2.32* 05/05/2009    Current radiology studies Dg Chest 2 View  05/16/2013   CLINICAL DATA:  Recent pneumonia.  History of lung cancer.  EXAM: CHEST  2  VIEW  COMPARISON:  PA and lateral chest 05/14/2013 and 08/17/2012. CT chest 08/21/2012.  FINDINGS: The lungs are emphysematous. Postoperative change left upper lobe again identified. No consolidative process, pneumothorax or effusion is identified. Heart size is normal.  IMPRESSION: Emphysema without acute disease.   Electronically Signed   By: Inge Rise M.D.   On: 05/16/2013 13:43    Disposition:  01-Home or Self Care      Discharge Orders   Future Appointments Provider Department Dept Phone   05/24/2013 3:00 PM Melvenia Needles, NP [facility] [phone]   06/05/2013 2:00 PM Sueanne Margarita, MD [facility] [phone]   Future Orders Complete By Expires   Diet - low sodium heart healthy   As directed    Increase activity slowly  As directed        Medication List    STOP taking these medications       HYDROcodone-homatropine 5-1.5 MG/5ML syrup  Commonly known as:  HYCODAN      TAKE these medications       aspirin 81 MG tablet  Take 81 mg by mouth daily.     cefdinir 300 MG capsule  Commonly known as:  OMNICEF  Take 1 capsule (300 mg total) by mouth 2 (two) times daily.     chlorpheniramine-HYDROcodone 10-8 MG/5ML Lqcr  Commonly known as:  TUSSIONEX PENNKINETIC ER  Take 5 mLs by mouth every 12 (twelve) hours as needed for cough.     dabigatran 150 MG Caps capsule  Commonly known as:  PRADAXA  Take 150 mg by mouth 2 (two) times daily.     dronedarone 400 MG tablet  Commonly known as:  MULTAQ  Take 400 mg by mouth 2 (two) times daily.     levalbuterol 45 MCG/ACT inhaler  Commonly known as:  XOPENEX HFA  Inhale 2 puffs into the lungs every 4 (four) hours as needed for wheezing.     lisinopril 5 MG tablet  Commonly known as:  PRINIVIL,ZESTRIL  Take 5 mg by mouth daily.     omega-3 acid ethyl esters 1 G capsule  Commonly known as:  LOVAZA  Take 1 g by mouth daily.     pantoprazole 40 MG tablet  Commonly known as:  PROTONIX  Take 40 mg by mouth daily.     rosuvastatin 10 MG tablet  Commonly known as:  CRESTOR  Take 10 mg by mouth daily.     tiotropium 18 MCG inhalation capsule  Commonly known as:  SPIRIVA HANDIHALER  Place 1 capsule (18 mcg total) into inhaler and inhale daily.     traZODone 150 MG tablet  Commonly known as:  DESYREL  Take 150 mg by mouth at bedtime.       Follow-up Information   Follow up with Sueanne Margarita, MD On 06/05/2013. (at 2pm )    Specialty:  Cardiology   Contact information:   [address] [place] [address] [phone]       Follow up with [facility], NP On 05/24/2013. (3pm )    Specialty:  Nurse Practitioner   Contact information:   [address] [place]  [address] [phone]       Discharged Condition: good  Physician Statement:   The Patient was personally examined, the discharge assessment and plan has been personally reviewed and I agree with ACNP Babcock's assessment and plan. > 30 minutes of time have been dedicated to discharge assessment, planning and  discharge instructions.   Signed: Erick Colace 05/17/2013, 11:39 AM   i have seen and examined this pt and agree with the plan above as noted. Burnett Harry WrightMD

## 2013-05-17 NOTE — Telephone Encounter (Signed)
Spoke with pharmacy.  Medicare will not cover Xopenex but will cover Proair instead.  Please advise if ok to change.

## 2013-05-17 NOTE — Progress Notes (Signed)
Subjective:  Feeling better. Converted back to NSR  Objective:  Temp:  [97.6 F (36.4 C)-98.9 F (37.2 C)] 97.6 F (36.4 C) (05/14 0449) Pulse Rate:  [50-144] 62 (05/14 0449) Resp:  [18-20] 18 (05/14 0449) BP: (116-132)/(50-98) 117/97 mmHg (05/14 0449) SpO2:  [95 %-96 %] 96 % (05/14 0449) Weight change:   Intake/Output from previous day: 05/13 0701 - 05/14 0700 In: 1273 [P.O.:960; I.V.:13; IV Piggyback:300] Out: -   Intake/Output from this shift: Total I/O In: 263 [I.V.:13; IV Piggyback:250] Out: -   Physical Exam: General appearance: alert and no distress Neck: no adenopathy, no carotid bruit, no JVD, supple, symmetrical, trachea midline and thyroid not enlarged, symmetric, no tenderness/mass/nodules Lungs: scattered wheezes bilaterally Heart: regular rate and rhythm, S1, S2 normal, no murmur, click, rub or gallop Extremities: extremities normal, atraumatic, no cyanosis or edema  Lab Results: Results for orders placed during the hospital encounter of 05/14/13 (from the past 48 hour(s))  CULTURE, EXPECTORATED SPUTUM-ASSESSMENT     Status: None   Collection Time    05/15/13 10:00 AM      Result Value Ref Range   Specimen Description SPUTUM     Special Requests NONE     Sputum evaluation       Value: THIS SPECIMEN IS ACCEPTABLE. RESPIRATORY CULTURE REPORT TO FOLLOW.   Report Status 05/15/2013 FINAL    CULTURE, RESPIRATORY (NON-EXPECTORATED)     Status: None   Collection Time    05/15/13 10:00 AM      Result Value Ref Range   Specimen Description SPUTUM     Special Requests NONE     Gram Stain       Value: RARE WBC PRESENT,BOTH PMN AND MONONUCLEAR     RARE SQUAMOUS EPITHELIAL CELLS PRESENT     RARE GRAM POSITIVE COCCI     IN PAIRS RARE GRAM NEGATIVE RODS     RARE GRAM POSITIVE RODS   Culture       Value: NORMAL OROPHARYNGEAL FLORA     Performed at [facility]   Report Status PENDING    BASIC METABOLIC PANEL     Status: Abnormal   Collection  Time    05/17/13  3:55 AM      Result Value Ref Range   Sodium 140  137 - 147 mEq/L   Potassium 3.8  3.7 - 5.3 mEq/L   Chloride 101  96 - 112 mEq/L   CO2 26  19 - 32 mEq/L   Glucose, Bld 156 (*) 70 - 99 mg/dL   BUN 19  6 - 23 mg/dL   Creatinine, Ser 1.12  0.50 - 1.35 mg/dL   Calcium 8.5  8.4 - 10.5 mg/dL   GFR calc non Af Amer 63 (*) >90 mL/min   GFR calc Af Amer 73 (*) >90 mL/min   Comment: (NOTE)     The eGFR has been calculated using the CKD EPI equation.     This calculation has not been validated in all clinical situations.     eGFR's persistently <90 mL/min signify possible Chronic Kidney     Disease.  CBC     Status: Abnormal   Collection Time    05/17/13  3:55 AM      Result Value Ref Range   WBC 10.9 (*) 4.0 - 10.5 K/uL   RBC 4.44  4.22 - 5.81 MIL/uL   Hemoglobin 12.3 (*) 13.0 - 17.0 g/dL   HCT 37.9 (*) 39.0 - 52.0 %  MCV 85.4  78.0 - 100.0 fL   MCH 27.7  26.0 - 34.0 pg   MCHC 32.5  30.0 - 36.0 g/dL   RDW 13.8  11.5 - 15.5 %   Platelets 179  150 - 400 K/uL    Imaging: Imaging results have been reviewed  Assessment/Plan:   1. Active Problems: 2.   CAP (community acquired pneumonia) 3.   Time Spent Directly with Patient:  20 minutes  Length of Stay:  LOS: 3 days   Pt admitted on Monday with CAP/bronchitis. On ATBX and pulm toilet. Pt went into AFIB with RVR and we were asked to see. He is on Pradaxa. Pt of Dr. Radford Pax. He was put on IV dilt for rate control which resulted in several long symptomatic pauses. Multaq was D/Cd. He has converted spontaneously to NSR with Rx of his pulm process and feels clinically improved. I think he can de D/Cd home on his home meds including Multaq when the [facility] service feels appropriate from a pulmonary point of view. Will need follow up arranged with Dr. Radford Pax 2-3 weeks. We will s/o. Call for further questions.   Lorretta Harp 05/17/2013, 6:42 AM

## 2013-05-17 NOTE — Telephone Encounter (Signed)
Rx for Proair sent to [facility].

## 2013-05-17 NOTE — Telephone Encounter (Signed)
Ok to change to [employer]

## 2013-05-20 LAB — CULTURE, BLOOD (ROUTINE X 2)
Culture: NO GROWTH
Culture: NO GROWTH

## 2013-05-24 ENCOUNTER — Encounter: Payer: Self-pay | Admitting: Adult Health

## 2013-05-24 ENCOUNTER — Ambulatory Visit (INDEPENDENT_AMBULATORY_CARE_PROVIDER_SITE_OTHER)
Admission: RE | Admit: 2013-05-24 | Discharge: 2013-05-24 | Disposition: A | Discharge status: Home / Self Care | Payer: Medicare Other | Source: Ambulatory Visit | Attending: Adult Health | Admitting: Adult Health

## 2013-05-24 ENCOUNTER — Ambulatory Visit (INDEPENDENT_AMBULATORY_CARE_PROVIDER_SITE_OTHER): Payer: Medicare Other | Admitting: Adult Health

## 2013-05-24 VITALS — BP 118/64 | HR 70 | Temp 97.8°F | Ht 71.0 in | Wt 209.4 lb

## 2013-05-24 DIAGNOSIS — J189 Pneumonia, unspecified organism: Secondary | ICD-10-CM

## 2013-05-24 DIAGNOSIS — J449 Chronic obstructive pulmonary disease, unspecified: Secondary | ICD-10-CM

## 2013-05-24 NOTE — Patient Instructions (Signed)
Continue on current regimen.  Take Spiriva daily  Great job on not smoking .  Mucinex DM Twice daily  As needed  Cough/congestion  Please contact office for sooner follow up if symptoms do not improve or worsen or seek emergency care  Follow up Dr. Joya Gaskins  In 3-4 weeks and As needed

## 2013-05-25 NOTE — Progress Notes (Signed)
Quick Note:  Left message w/ son, pt is out of town this weekend. Will call back. ______

## 2013-05-30 NOTE — Assessment & Plan Note (Signed)
Resolved on cxr with abx  Cont w/ current regimen

## 2013-05-30 NOTE — Progress Notes (Signed)
   Subjective:    Patient ID: Travis [name], male    DOB: October 30, 1940, 73 y.o.   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  Recent admit for RLL CAP , COPD exacerbation .  Tx w/ IV abx,  And discharged on omnicef .  Stay compliacted by Atrial fib with RVR. Improved with cardizem drip. Cards consult with plan to cont mutltaq and pradaxa.  Pt reports is 85% improved since discharge. Still having some lingering wheezing.   Denies any increased SOB, tightness, f/c/s, hemoptysis, nausea, vomiting.  finished abx this morning.  hasn't smoked since 5/10-encouraged on smoking cessation.  On ACE .  CXR today showed no acute changes.   Review of Systems Constitutional:   No  weight loss, night sweats,  Fevers, chills, fatigue, or  lassitude.  HEENT:   No headaches,  Difficulty swallowing,  Tooth/dental problems, or  Sore throat,                No sneezing, itching, ear ache,  +nasal congestion, post nasal drip,   CV:  No chest pain,  Orthopnea, PND, swelling in lower extremities, anasarca, dizziness, palpitations, syncope.   GI  No heartburn, indigestion, abdominal pain, nausea, vomiting, diarrhea, change in bowel habits, loss of appetite, bloody stools.   Resp:  .  No chest wall deformity  Skin: no rash or lesions.  GU: no dysuria, change in color of urine, no urgency or frequency.  No flank pain, no hematuria   MS:  No joint pain or swelling.  No decreased range of motion.  No back pain.  Psych:  No change in mood or affect. No depression or anxiety.  No memory loss.         Objective:   Physical Exam GEN: A/Ox3; pleasant , NAD, elderly   HEENT:  [place]/AT,  EACs-clear, TMs-wnl, NOSE-clear drainage  THROAT-clear, no lesions, no postnasal drip or  exudate noted.   NECK:  Supple w/ fair ROM; no JVD; normal carotid impulses w/o bruits; no thyromegaly or nodules palpated; no lymphadenopathy.  RESP  Diminished BS in bases , no wheezing no accessory muscle use, no dullness to percussion  CARD:  RRR, no m/r/g  , no peripheral edema, pulses intact, no cyanosis or clubbing.  GI:   Soft & nt; nml bowel sounds; no organomegaly or masses detected.  Musco: Warm bil, no deformities or joint swelling noted.   Neuro: alert, no focal deficits noted.    Skin: Warm, no lesions or rashes   05/24/13 CXR No active disease. No significant change.       Assessment & Plan:

## 2013-05-30 NOTE — Assessment & Plan Note (Signed)
Recent flare-resolving  Smoking cessation is key  Monitor flare and cough as he remains on ACE inhibitor  Plan  Continue on current regimen.  Take Spiriva daily  Great job on not smoking .  Mucinex DM Twice daily  As needed  Cough/congestion  Please contact office for sooner follow up if symptoms do not improve or worsen or seek emergency care  Follow up Dr. Joya Gaskins  In 3-4 weeks and As needed

## 2013-06-01 NOTE — Progress Notes (Signed)
Quick Note:  Called spoke with patient's spouse. Advised of lab results / recs as stated by TP. Spouse verbalized understanding and denied any questions. ______ 

## 2013-06-05 ENCOUNTER — Encounter: Payer: Medicare Other | Admitting: Cardiology

## 2013-06-16 ENCOUNTER — Other Ambulatory Visit: Payer: Self-pay | Admitting: Cardiology

## 2013-06-21 ENCOUNTER — Ambulatory Visit: Payer: Medicare Other | Admitting: Critical Care Medicine

## 2013-08-14 ENCOUNTER — Encounter: Payer: Self-pay | Admitting: Cardiology

## 2013-08-14 ENCOUNTER — Ambulatory Visit (INDEPENDENT_AMBULATORY_CARE_PROVIDER_SITE_OTHER): Payer: Medicare Other | Admitting: Cardiology

## 2013-08-14 VITALS — BP 118/88 | HR 63 | Ht 71.5 in | Wt 206.0 lb

## 2013-08-14 DIAGNOSIS — I2510 Atherosclerotic heart disease of native coronary artery without angina pectoris: Secondary | ICD-10-CM

## 2013-08-14 DIAGNOSIS — I480 Paroxysmal atrial fibrillation: Secondary | ICD-10-CM

## 2013-08-14 DIAGNOSIS — I10 Essential (primary) hypertension: Secondary | ICD-10-CM

## 2013-08-14 DIAGNOSIS — E785 Hyperlipidemia, unspecified: Secondary | ICD-10-CM

## 2013-08-14 DIAGNOSIS — I4891 Unspecified atrial fibrillation: Secondary | ICD-10-CM

## 2013-08-14 NOTE — Patient Instructions (Signed)
Your physician recommends that you continue on your current medications as directed. Please refer to the Current Medication list given to you today.  Your physician recommends that you return for a FASTING lipid profile and ALT. We will write you a RX to have it done at your PCP's  Your physician wants you to follow-up in: 6 months with Dr Mallie Snooks will receive a reminder letter in the mail two months in advance. If you don't receive a letter, please call our office to schedule the follow-up appointment.

## 2013-08-14 NOTE — Progress Notes (Signed)
[place], [place], [place]  [address] Phone: [phone] Fax:  [phone]  Date:  08/14/2013   ID:  Travis [name], DOB [birth year], MRN [id number]  PCP:  Abigail Miyamoto, MD  Cardiologist:  Fransico Him, MD     History of Present Illness:  This is a 73yo WM with a history of OSA intolerant to CPAP, dyslipidemia, PAF, HTN, dyslipidemia and CAD s/p PCI of the RCA 1994 with repeat cath 1999 with occluded RCA with left to right collaterals, PCI of LAD 2007 and PCI of LAD 2008 for instent thrombosis.  He presents today for followup. He is doing well.  He denies any chest pain, SOB, DOE, LE edema, dizziness, palpitations or syncope.  Unfortunately he has started smoking again.  Wt Readings from Last 3 Encounters:  08/14/13 206 lb (93.441 kg)  05/24/13 209 lb 6.4 oz (94.983 kg)  05/14/13 205 lb 4 oz (93.1 kg)     Past Medical History  Diagnosis Date  . Dyslipidemia   . OSA (obstructive sleep apnea)     intolerant to CPAP  . Psoriasis   . Insomnia   . Lung cancer     s/p lobectomy, chemo and radiation therapy  . PAF (paroxysmal atrial fibrillation)   . Hypertension   . Hyperlipidemia   . CAD (coronary artery disease)     s/p RCA atherectomy 1994, cath 1999 with occluded RCA with left to right collaterals, PCI of LAD 2007, s/p PCI of LAD for instent thrombosis 2008    Current Outpatient Prescriptions  Medication Sig Dispense Refill  . aspirin 81 MG tablet Take 81 mg by mouth daily.        . dabigatran (PRADAXA) 150 MG CAPS capsule Take 150 mg by mouth 2 (two) times daily.      Marland Kitchen dronedarone (MULTAQ) 400 MG tablet Take 400 mg by mouth 2 (two) times daily.       Marland Kitchen lisinopril (PRINIVIL,ZESTRIL) 5 MG tablet Take 5 mg by mouth daily.      . pantoprazole (PROTONIX) 40 MG tablet Take 40 mg by mouth daily.        . rosuvastatin (CRESTOR) 10 MG tablet Take 10 mg by mouth daily.      Marland Kitchen tiotropium (SPIRIVA HANDIHALER) 18 MCG inhalation capsule Place 1 capsule (18 mcg  total) into inhaler and inhale daily.  30 capsule  11  . traZODone (DESYREL) 150 MG tablet Take 150 mg by mouth at bedtime.       No current facility-administered medications for this visit.    Allergies:   No Known Allergies  Social History:  The patient  reports that he has been smoking Cigarettes.  He has a 43.5 pack-year smoking history. He quit smokeless tobacco use about 13 months ago. His smokeless tobacco use included Chew. He reports that he does not drink alcohol or use illicit drugs.   Family History:  The patient's family history includes Breast cancer in his sister; Coronary artery disease in his brother; Diabetes in his sister; Heart attack in his father and mother; Heart disease in his mother and sister; Lymphoma in his brother.   ROS:  Please see the history of present illness.      All other systems reviewed and negative.   PHYSICAL EXAM: VS:  BP 118/88  Pulse 63  Ht 5' 11.5" (1.816 m)  Wt 206 lb (93.441 kg)  BMI 28.33 kg/m2  SpO2 96% Well nourished, well developed, in no acute  distress HEENT: normal Neck: no JVD Cardiac:  normal S1, S2; RRR; no murmur Lungs:  clear to auscultation bilaterally, no wheezing, rhonchi or rales Abd: soft, nontender, no hepatomegaly Ext: no edema Skin: warm and dry Neuro:  CNs 2-12 intact, no focal abnormalities noted       ASSESSMENT AND PLAN:  1. ASCAD with no angina - continue ASA 2. HTN well controlled - continue Lisinopril 3. Dyslipidemia - continue crestor - check FLP and ALT - he will get this done at Dr. Delman Kitten office and fax results to [place] 4. PAF maintaining NSR - continue Multaq/Pradaxa - BMET and CBC normal on 05/17/2013 5.  Tobacco use - he has restarted - I stress to him that he needs to stop tobacco use due to underlying CAD  Followup with me in 6 months  Signed, Fransico Him, MD 08/14/2013 3:53 PM

## 2013-09-16 ENCOUNTER — Other Ambulatory Visit: Payer: Self-pay | Admitting: Cardiology

## 2013-11-05 ENCOUNTER — Other Ambulatory Visit: Payer: Self-pay | Admitting: Cardiology

## 2013-12-07 ENCOUNTER — Other Ambulatory Visit: Payer: Self-pay | Admitting: Family Medicine

## 2013-12-07 DIAGNOSIS — Z85118 Personal history of other malignant neoplasm of bronchus and lung: Secondary | ICD-10-CM

## 2013-12-13 ENCOUNTER — Ambulatory Visit (INDEPENDENT_AMBULATORY_CARE_PROVIDER_SITE_OTHER): Payer: Medicare Other

## 2013-12-13 DIAGNOSIS — J432 Centrilobular emphysema: Secondary | ICD-10-CM

## 2013-12-13 DIAGNOSIS — Z85118 Personal history of other malignant neoplasm of bronchus and lung: Secondary | ICD-10-CM

## 2013-12-13 DIAGNOSIS — I2510 Atherosclerotic heart disease of native coronary artery without angina pectoris: Secondary | ICD-10-CM

## 2013-12-13 DIAGNOSIS — K828 Other specified diseases of gallbladder: Secondary | ICD-10-CM

## 2014-01-28 ENCOUNTER — Encounter: Payer: Self-pay | Admitting: Cardiology

## 2014-01-28 NOTE — Telephone Encounter (Signed)
Patient needs preop clearance for eyelid surgery.  Please find out if patient has had any chest pain or SOB and find out if he can walk 4 blocks without any symptoms.

## 2014-01-28 NOTE — Telephone Encounter (Signed)
This encounter was created in error - please disregard.

## 2014-02-13 ENCOUNTER — Encounter: Payer: Medicare Other | Admitting: Cardiology

## 2014-02-13 ENCOUNTER — Telehealth: Payer: Self-pay | Admitting: Cardiology

## 2014-02-13 NOTE — Telephone Encounter (Signed)
New message     Request for surgical clearance:  1. What type of surgery is being performed? Eyelid surgery  2. When is this surgery scheduled? 02-20-14  3. Are there any medications that need to be held prior to surgery and how long?prodaxa---how long does Dr Radford Pax say hold medication  4. Name of physician performing surgery? A doctor in [place]----he cannot remember the name of the doctor [place]  5. What is your office phone and fax number?

## 2014-02-13 NOTE — Progress Notes (Signed)
This encounter was created in error - please disregard.

## 2014-02-13 NOTE — Telephone Encounter (Signed)
Called patient to discuss eye surgery. Patient st he has an appointment tomorrow and he will have them fax a form for clearance. Patient unable to take fax number at this time, he st he will have the office call tomorrow if they need it.

## 2014-03-19 NOTE — Progress Notes (Signed)
This encounter was created in error - please disregard.

## 2014-03-20 ENCOUNTER — Encounter: Payer: Self-pay | Admitting: Cardiology

## 2014-04-01 ENCOUNTER — Telehealth: Payer: Self-pay | Admitting: Cardiology

## 2014-04-01 NOTE — Telephone Encounter (Signed)
New Message  Megan from Dr. Edsel Petrin office calling about pt's upcoming Implanting procedure- was wanting to speak w/ Rn about blood thinner protocol. Please call back (ask for [facility] and discuss.    Request for surgical clearance:  1. What type of surgery is being performed? Implanting procedure   2. When is this surgery scheduled? 4/11   3. Are there any medications that need to be held prior to surgery and how long? Did not specify  4. Name of physician performing surgery? [place]   5. What is your office phone and fax number? n/a 6.

## 2014-04-02 NOTE — Telephone Encounter (Signed)
Per Jinny Blossom, patient is to have a dental implant in 2 sites. No anesthesia will be necessary.  The procedure is scheduled for 4/11.  They are requesting Pradaxa and ASA instructions for procedure.  To [facility] for instructions.   To be faxed to [facility] at [phone]

## 2014-04-03 NOTE — Telephone Encounter (Signed)
LMOM for [place] with Dr. Edsel Petrin office to determine how invasive the procedure will be.

## 2014-04-04 NOTE — Telephone Encounter (Signed)
Spoke with [place] with Dr. Edsel Petrin' office.  They will be creating a flap in the gumline then drilling into the bone to place the implant.    Pt has a CHADS score of 1.  CrCl- 75 mL/min.  Would hold Pradaxa x 2 days prior to procedure.    Will send to Dr. Radford Pax to address ASA

## 2014-04-06 NOTE — Telephone Encounter (Signed)
Ok to hold ASA for procedure 

## 2014-04-08 NOTE — Telephone Encounter (Signed)
Advised patient and will fax to [facility]

## 2014-04-15 ENCOUNTER — Telehealth: Payer: Self-pay | Admitting: Cardiology

## 2014-04-15 NOTE — Telephone Encounter (Signed)
Megan calling to ask about clearance that was never received.  Confirmed fax number- [phone] with Jinny Blossom and apologized we had the wrong number.   Printed and faxed to Dr. Edsel Petrin' office.

## 2014-04-15 NOTE — Telephone Encounter (Signed)
New message    Office calling back stating on the fax cover sheet came threw.

## 2014-04-15 NOTE — Telephone Encounter (Signed)
Refaxed clearance and called office to confirm they received it.

## 2014-04-17 ENCOUNTER — Encounter: Payer: Self-pay | Admitting: Cardiology

## 2014-04-17 ENCOUNTER — Ambulatory Visit (INDEPENDENT_AMBULATORY_CARE_PROVIDER_SITE_OTHER): Payer: Medicare Other | Admitting: Cardiology

## 2014-04-17 VITALS — BP 150/82 | HR 53 | Ht 71.5 in | Wt 217.0 lb

## 2014-04-17 DIAGNOSIS — F17219 Nicotine dependence, cigarettes, with unspecified nicotine-induced disorders: Secondary | ICD-10-CM

## 2014-04-17 DIAGNOSIS — I480 Paroxysmal atrial fibrillation: Secondary | ICD-10-CM | POA: Diagnosis not present

## 2014-04-17 DIAGNOSIS — I10 Essential (primary) hypertension: Secondary | ICD-10-CM

## 2014-04-17 DIAGNOSIS — E785 Hyperlipidemia, unspecified: Secondary | ICD-10-CM

## 2014-04-17 DIAGNOSIS — J449 Chronic obstructive pulmonary disease, unspecified: Secondary | ICD-10-CM

## 2014-04-17 DIAGNOSIS — G4733 Obstructive sleep apnea (adult) (pediatric): Secondary | ICD-10-CM | POA: Diagnosis not present

## 2014-04-17 NOTE — Assessment & Plan Note (Signed)
Holding NSR on Multaq

## 2014-04-17 NOTE — Assessment & Plan Note (Signed)
He is still smoking < 1/2 pk a day

## 2014-04-17 NOTE — Assessment & Plan Note (Signed)
s/p RCA atherectomy 1994, cath 1999 with occluded RCA with left to right collaterals, PCI of LAD 2007, s/p PCI of LAD for instent thrombosis 2008 No anginal symptoms

## 2014-04-17 NOTE — Assessment & Plan Note (Signed)
Chronic obstructive lung disease with asthmatic bronchitic component  Hx of lung cancer s/p lobectomy and chemo, followed by Dr Joya Gaskins

## 2014-04-17 NOTE — Patient Instructions (Signed)
Medication Instructions:  NONE AT THIS TIME   Labwork: NONE AT THIS TIME   Testing/Procedures: NONE AT THIS TIME   Follow-Up: Your physician wants you to follow-up in: [place] will receive a reminder letter in the mail two months in advance. If you don't receive a letter, please call our office to schedule the follow-up appointment.   Any Other Special Instructions Will Be Listed Below (If Applicable).

## 2014-04-17 NOTE — Assessment & Plan Note (Signed)
Followed by PCP

## 2014-04-17 NOTE — Assessment & Plan Note (Signed)
Compliant with C-pap 

## 2014-04-17 NOTE — Assessment & Plan Note (Signed)
Repeat B/P by me 142/82. Lisinopril recently increased by PCP

## 2014-04-17 NOTE — Progress Notes (Signed)
04/17/2014 Travis [name]   03-Mar-1940  [id number]  Primary Physician [name], Travis Graff, MD Primary Cardiologist: Dr Travis [name]  HPI:  74 y/o followed by Dr Travis [name] with [name] as described in problem list. He has not had recent angina. He is here for a 6 month check up. His last functional study was an echo in 2010 that showed an EF of 62%. 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 They told him he had some hearing deficit and balance problems. The pt denies any syncope or pre syncope. He denies any palpitations.    Current Outpatient Prescriptions  Medication Sig Dispense Refill  . acetaminophen-codeine (TYLENOL #3) 300-30 MG per tablet Take 1 tablet by mouth every 4 (four) hours as needed.    Marland Kitchen amoxicillin (AMOXIL) 500 MG tablet Take 500 mg by mouth 3 (three) times daily. For dental procedure for ten (10) days    . aspirin 81 MG tablet Take 81 mg by mouth daily.      Marland Kitchen dronedarone (MULTAQ) 400 MG tablet Take 400 mg by mouth 2 (two) times daily.     Marland Kitchen ibuprofen (ADVIL,MOTRIN) 800 MG tablet Take 800 mg by mouth at bedtime.    Marland Kitchen lisinopril (PRINIVIL,ZESTRIL) 10 MG tablet Take 10 mg by mouth daily.    . pantoprazole (PROTONIX) 40 MG tablet Take 40 mg by mouth daily.      Marland Kitchen PRADAXA 150 MG CAPS capsule TAKE ONE CAPSULE BY MOUTH TWICE A DAY 60 capsule 5  . rosuvastatin (CRESTOR) 5 MG tablet Take 5 mg by mouth daily.    . traZODone (DESYREL) 150 MG tablet Take 150 mg by mouth at bedtime.     No current facility-administered medications for this visit.    No Known Allergies  History   Social History  . Marital Status: Married    Spouse Name: N/A  . Number of Children: 3  . Years of Education: N/A   Occupational History  . Truck Geophysicist/field seismologist    Social History Main Topics  . Smoking status: Current Every Day Smoker -- 0.75 packs/day for 58 years    Types: Cigarettes    Last Attempt to Quit: 05/13/2013  . Smokeless tobacco: Former Systems developer    Types: Chew     Quit date: 07/04/2012     Comment: started smoking at age 76.  Marland Kitchen Alcohol Use: No  . Drug Use: No  . Sexual Activity: Not on file   Other Topics Concern  . Not on file   Social History Narrative     Review of Systems: General: negative for chills, fever, night sweats or weight changes.  Cardiovascular: negative for chest pain, dyspnea on exertion, edema, orthopnea, palpitations, paroxysmal nocturnal dyspnea or shortness of breath Dermatological: negative for rash Respiratory: negative for cough or wheezing Urologic: negative for hematuria Abdominal: negative for nausea, vomiting, diarrhea, bright red blood per rectum, melena, or hematemesis Neurologic: negative for visual changes, syncope, or dizziness He had dental work Monday "implants". He was supposed to have eyelid surgery but this has been put off for now.  All other systems reviewed and re otherwise negative except as noted above.    Blood pressure 150/82, pulse 53, height 5' 11.5" (1.816 m), weight 217 lb (98.431 kg).  General appearance: alert, cooperative, no distress and moderately obese Neck: no carotid bruit and no JVD Lungs: dedcreased on Lt Heart: regular rate and rhythm Extremities: no edema Pulses: 2+  and symmetric Skin: Skin color, texture, turgor normal. No rashes or lesions Neurologic: Grossly normal  EKG NSR SB 53  ASSESSMENT AND PLAN:   PAF (paroxysmal atrial fibrillation) Holding NSR on Multaq   OSA (obstructive sleep apnea) Compliant with C-pap   CAD S/P percutaneous coronary angioplasty s/p RCA atherectomy 1994, cath 1999 with occluded RCA with left to right collaterals, PCI of LAD 2007, s/p PCI of LAD for instent thrombosis 2008 No anginal symptoms   Dyslipidemia Followed by PCP   Hypertension Repeat B/P by me 142/82. Lisinopril recently increased by PCP   Obstructive chronic bronchitis without exacerbation Chronic obstructive lung disease with asthmatic bronchitic component    Hx of lung cancer s/p lobectomy and chemo, followed by Dr Travis [name]   Nicotine addiction He is still smoking < 1/2 pk a day    PLAN  He is stable from a cardiac standpoint. He has bradycardia but is asymptomatic. He should continue his current medications. He can f/u with Dr Travis [name] in 6 months. We encouraged him to stop smoking. His PCP has been following his lipids and his B/P. No further cardiac testing from our standpoint at this time.   Travis [name] KPA-C 04/17/2014 9:27 AM

## 2014-04-19 ENCOUNTER — Telehealth: Payer: Self-pay | Admitting: Cardiology

## 2014-04-19 DIAGNOSIS — I480 Paroxysmal atrial fibrillation: Secondary | ICD-10-CM

## 2014-04-19 DIAGNOSIS — I2510 Atherosclerotic heart disease of native coronary artery without angina pectoris: Secondary | ICD-10-CM

## 2014-04-19 DIAGNOSIS — Z9861 Coronary angioplasty status: Principal | ICD-10-CM

## 2014-04-19 NOTE — Telephone Encounter (Signed)
Attempted to call. Was on hold for 13 minutes and hung up.

## 2014-04-19 NOTE — Telephone Encounter (Signed)
New message     [facility]  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  She will tell you specific what she needs.  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  Pt saw Lurena Joiner recently but he did not address it.  It is something about his CAP stuff.

## 2014-04-22 NOTE — Telephone Encounter (Signed)
Left message to call back  

## 2014-04-22 NOTE — Telephone Encounter (Signed)
Ok to set up for ETT.  THere should be a DOT form that the PCP supplies to cardiologist to sign.

## 2014-04-22 NOTE — Telephone Encounter (Signed)
Spoke with Candace Cruise, NP. She st the patient needs a GXT q2 years to maintain DOT specifications. 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  All information to be sent to Candace Cruise at [phone]

## 2014-04-23 NOTE — Telephone Encounter (Signed)
New Message       Pt's wife calling stating that per DOT clinic pt needs to have a stress test prior to 04/26/14. There is no order in Epic, please call back and advise.

## 2014-04-23 NOTE — Telephone Encounter (Signed)
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  Informed her that GXT will be ordered and scheduled ASAP, but chances are that it will not be completed before 4/22.  Informed her that Dr. Radford Pax needs paperwork to sign for DOT. Informed her that [facility] needs to get a download sent to Dr. Radford Pax as well. She agrees with treatment plan.  GXT ordered for scheduling and instructions reviewed. [place] notified to call patient.

## 2014-04-24 NOTE — Telephone Encounter (Signed)
Sonia Baller st her husband will get paperwork from [facility] and give to Dr. Radford Pax. They will not send without his consent.

## 2014-04-25 ENCOUNTER — Telehealth: Payer: Self-pay | Admitting: Cardiology

## 2014-04-25 NOTE — Telephone Encounter (Signed)
Returned call from [facility]. She is with patient and will call triage back.

## 2014-04-25 NOTE — Telephone Encounter (Signed)
New message    NPA calling from med clinic would like Katy to call her tomorrow regarding patient DOT physical.

## 2014-04-25 NOTE — Telephone Encounter (Signed)
[facility] called back. She is sending over the specific requirements that Dr. Radford Pax needs to include on a letter (official letterhead) that addresses each item for the DOT clearance. This is due by April 28th, as the patient's current DOT clearance expires on 4/29 and he would not be allowed to drive/work. Awaiting further information from [facility] so it can be forwarded to Dr. Radford Pax. Will route to Lenice Llamas, RN, so that when the information comes in it will be expedited to Dr. Radford Pax.

## 2014-04-26 ENCOUNTER — Ambulatory Visit (HOSPITAL_COMMUNITY)
Admission: RE | Admit: 2014-04-26 | Discharge: 2014-04-26 | Disposition: A | Discharge status: Home / Self Care | Payer: Medicare Other | Source: Ambulatory Visit | Attending: Cardiology | Admitting: Cardiology

## 2014-04-26 ENCOUNTER — Other Ambulatory Visit: Payer: Self-pay | Admitting: Physician Assistant

## 2014-04-26 DIAGNOSIS — R9431 Abnormal electrocardiogram [ECG] [EKG]: Secondary | ICD-10-CM

## 2014-04-26 DIAGNOSIS — I480 Paroxysmal atrial fibrillation: Secondary | ICD-10-CM

## 2014-04-26 DIAGNOSIS — R0789 Other chest pain: Secondary | ICD-10-CM | POA: Insufficient documentation

## 2014-04-26 DIAGNOSIS — Z9861 Coronary angioplasty status: Secondary | ICD-10-CM

## 2014-04-26 DIAGNOSIS — I2510 Atherosclerotic heart disease of native coronary artery without angina pectoris: Secondary | ICD-10-CM | POA: Diagnosis not present

## 2014-04-26 NOTE — Progress Notes (Signed)
     Seen in [facility] for DOT GXT stress test.   Patient reached target HR of 123. But had to stop due to severe dyspnea. He walked 4 min 41 sec. No CP. There was evidence of ST depression in inferolat leads. He also had 7 beats of SVT. I showed the strips to Dr Radford Pax his primary cardiologist who recommended ETT nuclear stress test for further risk stratification. This has been arranged for next Monday 04/29/14 at 7:30am.    Angelena Form PA-C  [facility]

## 2014-04-29 ENCOUNTER — Ambulatory Visit (HOSPITAL_COMMUNITY): Payer: Medicare Other | Attending: Cardiology | Admitting: Radiology

## 2014-04-29 DIAGNOSIS — R001 Bradycardia, unspecified: Secondary | ICD-10-CM | POA: Diagnosis not present

## 2014-04-29 DIAGNOSIS — R9431 Abnormal electrocardiogram [ECG] [EKG]: Secondary | ICD-10-CM | POA: Diagnosis not present

## 2014-04-29 DIAGNOSIS — I2510 Atherosclerotic heart disease of native coronary artery without angina pectoris: Secondary | ICD-10-CM | POA: Diagnosis not present

## 2014-04-29 MED ORDER — TECHNETIUM TC 99M SESTAMIBI GENERIC - CARDIOLITE
33.0000 | Freq: Once | INTRAVENOUS | Status: AC | PRN
  Administered 2014-04-29: 33 via INTRAVENOUS

## 2014-04-29 MED ORDER — REGADENOSON 0.4 MG/5ML IV SOLN
0.4000 mg | Freq: Once | INTRAVENOUS | Status: AC
  Administered 2014-04-29: 0.4 mg via INTRAVENOUS

## 2014-04-29 MED ORDER — TECHNETIUM TC 99M SESTAMIBI GENERIC - CARDIOLITE
11.0000 | Freq: Once | INTRAVENOUS | Status: AC | PRN
  Administered 2014-04-29: 11 via INTRAVENOUS

## 2014-04-29 NOTE — Telephone Encounter (Signed)
Instructions received and given to Dr. Radford Pax.

## 2014-04-29 NOTE — Progress Notes (Signed)
[facility] [address] [place], [place] [address] [phone]    Cardiology Nuclear Med Study  Travis [name] is a 74 y.o. male     MRN : [id number]     DOB: Jan 18, 1940  Procedure Date: 04/29/2014  Nuclear Med Background Indication for Stress Test:  Evaluation for Ischemia, Stent Patency, and 04-26-2014 Abnormal GXT: Nonspecific ST changes, 7 beats SVT History:  CAD, MPI 2012 (normal) EF 62% Cardiac Risk Factors: Hypertension and Lipids  Symptoms:  None indicated, DOT physical   Nuclear Pre-Procedure Caffeine/Decaff Intake:  None> 12 hrs NPO After: 8:00pm   Lungs:  Slight wheezing O2 Sat: 93% on room air. IV 0.9% NS with Angio Cath:  22g  IV Site: R Wrist x 1, tolerated well IV Started by:  Irven Baltimore, RN  Chest Size (in):  44 Cup Size: n/a  Height: 6' (1.829 m)  Weight:  207 lb (93.895 kg)  BMI:  Body mass index is 28.07 kg/(m^2). Tech Comments:  Patient took Lisinopril last night. Irven Baltimore, RN.    Nuclear Med Study 1 or 2 day study: 1 day  Stress Test Type:  Treadmill/Lexiscan  Reading MD: N/A  Order Authorizing Provider:  Fransico Him, MD  Resting Radionuclide: Technetium 8mSestamibi  Resting Radionuclide Dose: 11.0 mCi   Stress Radionuclide:  Technetium 921mestamibi  Stress Radionuclide Dose: 33.0 mCi           Stress Protocol Rest HR: 51 Stress HR: 91  Rest BP: 168/89 Stress BP: 138/75  Exercise Time (min): n/a METS: n/a   Predicted Max HR: 145 bpm % Max HR: 62.76 bpm Rate Pressure Product: 13923   Dose of Adenosine (mg):  n/a Dose of Lexiscan: 0.4 mg  Dose of Atropine (mg): n/a Dose of Dobutamine: n/a mcg/kg/min (at max HR)  Stress Test Technologist: ShGlade LloydBS-ES  Nuclear Technologist:  ElEarl ManyCNMT     Rest Procedure:  Myocardial perfusion imaging was performed at rest 45 minutes following the intravenous administration of Technetium 992mstamibi. Rest ECG: Sinus bradycardia, cannot R/O prior  septal MI, nonspecific ST changes.  Stress Procedure:  The patient received IV Lexiscan 0.4 mg over 15-seconds with concurrent low level exercise and then Technetium 85m80mtamibi was injected at 30-seconds while the patient continued walking one more minute.  Quantitative spect images were obtained after a 45-minute delay.  During the infusion of Lexiscan the patient complained of feeling weak but this quickly resolved in recovery.  Stress ECG: No significant ST segment change suggestive of ischemia.  QPS Raw Data Images:  Acquisition technically good; normal left ventricular size. Stress Images:  There is decreased uptake in the inferior wall. Rest Images:  There is decreased uptake in the inferior wall. Subtraction (SDS):  No evidence of ischemia. Transient Ischemic Dilatation (Normal <1.22):  1.08 Lung/Heart Ratio (Normal <0.45):  0.34  Quantitative Gated Spect Images QGS EDV:  122 ml QGS ESV:  58 ml  Impression Exercise Capacity:  Lexiscan with low level exercise. BP Response:  Normal blood pressure response. Clinical Symptoms:  No chest pain or dyspnea ECG Impression:  No significant ST segment change suggestive of ischemia. 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  LV Ejection Fraction: 53%.  LV Wall Motion:  Mild inferior hypokinesis.   [name]

## 2014-05-01 ENCOUNTER — Telehealth: Payer: Self-pay | Admitting: Cardiology

## 2014-05-01 ENCOUNTER — Encounter: Payer: Self-pay | Admitting: Cardiovascular Disease

## 2014-05-01 NOTE — Telephone Encounter (Signed)
Will forward to Lenice Llamas, RN for review and advisement.

## 2014-05-01 NOTE — Telephone Encounter (Signed)
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 Please have Dr Radford Pax put on letterhead the things she asked for in the email so that this man can get his DOT certification.  If you call after 2:30, please just [facility]---you have the email address.  She is aware Valetta Fuller is out today----OK to call tomorrow.

## 2014-05-02 NOTE — Telephone Encounter (Signed)
Printed off both stress tests per [employer]. Per Herschel Senegal, letter is to read (with Dr. Theodosia Blender consent):   "Travis [name] has been anticoagulated for at least 1 month. He is rate and rhythm controlled. Since taking his Pradaxa, monthly INRs are not needed. Travis [name] is prescribed CPAP nightly. He is to provide a 90-day download to support his compliance to his DOT provider. As of this time, no daytime oxygen is necessary based on current exam. 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

## 2014-05-02 NOTE — Telephone Encounter (Signed)
Spoke with Herschel Senegal, NP in great detail about patient and DOT qualifications.  She st the "paperwork" for [facility] is all in EPIC and there is nothing to send for Dr. Radford Pax to sign - doctors have always had to write letters for clearance for them.  Went over GXT results with [place] and let her know that a nuclear stress test had to be done as the GXT was abnormal (patient developed dyspnea right under 5 minutes).  Herschel Senegal is to review stress tests and let me know if the patient failed per DOT guidelines. If patient passed, I will talk to Dr. Radford Pax about writing letter.

## 2014-05-15 ENCOUNTER — Other Ambulatory Visit: Payer: Self-pay | Admitting: Cardiology

## 2014-06-10 ENCOUNTER — Other Ambulatory Visit: Payer: Self-pay | Admitting: Family Medicine

## 2014-11-07 ENCOUNTER — Other Ambulatory Visit: Payer: Self-pay | Admitting: Cardiology

## 2014-11-20 ENCOUNTER — Encounter: Payer: Self-pay | Admitting: Internal Medicine

## 2014-11-20 ENCOUNTER — Ambulatory Visit (INDEPENDENT_AMBULATORY_CARE_PROVIDER_SITE_OTHER): Payer: Medicare Other | Admitting: Internal Medicine

## 2014-11-20 ENCOUNTER — Ambulatory Visit (INDEPENDENT_AMBULATORY_CARE_PROVIDER_SITE_OTHER): Payer: Medicare Other | Admitting: Cardiology

## 2014-11-20 ENCOUNTER — Encounter: Payer: Self-pay | Admitting: Cardiology

## 2014-11-20 ENCOUNTER — Encounter: Payer: Self-pay | Admitting: *Deleted

## 2014-11-20 VITALS — BP 126/74 | HR 60 | Ht 68.5 in | Wt 201.0 lb

## 2014-11-20 VITALS — BP 140/78 | HR 56 | Ht 68.0 in | Wt 200.0 lb

## 2014-11-20 DIAGNOSIS — Z716 Tobacco abuse counseling: Secondary | ICD-10-CM | POA: Diagnosis not present

## 2014-11-20 DIAGNOSIS — J449 Chronic obstructive pulmonary disease, unspecified: Secondary | ICD-10-CM | POA: Diagnosis not present

## 2014-11-20 DIAGNOSIS — I10 Essential (primary) hypertension: Secondary | ICD-10-CM

## 2014-11-20 DIAGNOSIS — I2510 Atherosclerotic heart disease of native coronary artery without angina pectoris: Secondary | ICD-10-CM

## 2014-11-20 DIAGNOSIS — Z720 Tobacco use: Secondary | ICD-10-CM

## 2014-11-20 DIAGNOSIS — IMO0001 Reserved for inherently not codable concepts without codable children: Secondary | ICD-10-CM

## 2014-11-20 DIAGNOSIS — R0989 Other specified symptoms and signs involving the circulatory and respiratory systems: Secondary | ICD-10-CM | POA: Insufficient documentation

## 2014-11-20 DIAGNOSIS — I480 Paroxysmal atrial fibrillation: Secondary | ICD-10-CM | POA: Diagnosis not present

## 2014-11-20 DIAGNOSIS — Z9861 Coronary angioplasty status: Secondary | ICD-10-CM

## 2014-11-20 DIAGNOSIS — E785 Hyperlipidemia, unspecified: Secondary | ICD-10-CM

## 2014-11-20 DIAGNOSIS — Z8249 Family history of ischemic heart disease and other diseases of the circulatory system: Secondary | ICD-10-CM

## 2014-11-20 LAB — CBC WITH DIFFERENTIAL/PLATELET
BASOS ABS: 0 10*3/uL (ref 0.0–0.1)
Basophils Relative: 0 % (ref 0–1)
EOS ABS: 0.3 10*3/uL (ref 0.0–0.7)
EOS PCT: 4 % (ref 0–5)
HEMATOCRIT: 40.7 % (ref 39.0–52.0)
Hemoglobin: 12.8 g/dL — ABNORMAL LOW (ref 13.0–17.0)
LYMPHS PCT: 25 % (ref 12–46)
Lymphs Abs: 1.8 10*3/uL (ref 0.7–4.0)
MCH: 25.8 pg — ABNORMAL LOW (ref 26.0–34.0)
MCHC: 31.4 g/dL (ref 30.0–36.0)
MCV: 81.9 fL (ref 78.0–100.0)
MPV: 10.5 fL (ref 8.6–12.4)
Monocytes Absolute: 0.7 10*3/uL (ref 0.1–1.0)
Monocytes Relative: 9 % (ref 3–12)
NEUTROS PCT: 62 % (ref 43–77)
Neutro Abs: 4.5 10*3/uL (ref 1.7–7.7)
PLATELETS: 158 10*3/uL (ref 150–400)
RBC: 4.97 MIL/uL (ref 4.22–5.81)
RDW: 15.1 % (ref 11.5–15.5)
WBC: 7.3 10*3/uL (ref 4.0–10.5)

## 2014-11-20 LAB — LIPID PANEL
Cholesterol: 89 mg/dL — ABNORMAL LOW (ref 125–200)
HDL: 33 mg/dL — AB (ref >=40)
LDL CALC: 43 mg/dL (ref <130)
TRIGLYCERIDES: 65 mg/dL (ref <150)
Total CHOL/HDL Ratio: 2.7 Ratio (ref <=5.0)
VLDL: 13 mg/dL (ref <30)

## 2014-11-20 LAB — BASIC METABOLIC PANEL
BUN: 11 mg/dL (ref 7–25)
CALCIUM: 8.8 mg/dL (ref 8.6–10.3)
CO2: 27 mmol/L (ref 20–31)
CREATININE: 1.14 mg/dL (ref 0.70–1.18)
Chloride: 106 mmol/L (ref 98–110)
GLUCOSE: 109 mg/dL — AB (ref 65–99)
Potassium: 4.1 mmol/L (ref 3.5–5.3)
SODIUM: 140 mmol/L (ref 135–146)

## 2014-11-20 LAB — HEPATIC FUNCTION PANEL
ALK PHOS: 65 U/L (ref 40–115)
ALT: 11 U/L (ref 9–46)
AST: 14 U/L (ref 10–35)
Albumin: 4 g/dL (ref 3.6–5.1)
BILIRUBIN DIRECT: 0.2 mg/dL (ref <=0.2)
BILIRUBIN INDIRECT: 0.5 mg/dL (ref 0.2–1.2)
Total Bilirubin: 0.7 mg/dL (ref 0.2–1.2)
Total Protein: 6.4 g/dL (ref 6.1–8.1)

## 2014-11-20 MED ORDER — VARENICLINE TARTRATE 1 MG PO TABS: 1.0000 mg | ORAL_TABLET | Freq: Two times a day (BID) | ORAL | Status: DC

## 2014-11-20 MED ORDER — IRBESARTAN 75 MG PO TABS: 75.0000 mg | ORAL_TABLET | Freq: Every day | ORAL | Status: DC

## 2014-11-20 MED ORDER — VARENICLINE TARTRATE 0.5 MG X 11 & 1 MG X 42 PO MISC: ORAL | Status: DC

## 2014-11-20 NOTE — Progress Notes (Addendum)
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  Recent admit for RLL CAP , COPD exacerbation .  Tx w/ IV abx,  And discharged on omnicef .  Stay compliacted by Atrial fib with RVR. Improved with cardizem drip. Cards consult with plan to cont mutltaq and pradaxa.  Pt reports is 85% improved since discharge. Still having some lingering wheezing.   Denies any increased SOB, tightness, f/c/s, hemoptysis, nausea, vomiting.  finished abx this morning.  hasn't smoked since 5/10-encouraged on smoking cessation.  On ACE i   rec Continue on current regimen.  Take Spiriva daily  Great job on not smoking .  Mucinex DM Twice daily  As needed  Cough/congestion  Please contact office for sooner follow up if symptoms do not improve or worsen or seek emergency care     11/20/2014  Consultation/[facility]in re: active smoker/ GOLD II copd  Not on active meds  Chief Complaint  Patient presents with  . Pulmonary Consult    Former patient of Dr Joya Gaskins. Pt needing DOT clearance. Pt states that his breathing is doing well. He denies any co's today.     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 No unusual exp hx or h/o childhood pna/ asthma or knowledge of premature birth.  Sleeping ok without nocturnal  or early am exacerbation  of respiratory  c/o's or need for noct saba. Also denies any obvious fluctuation of symptoms with weather or environmental changes or other aggravating or alleviating factors except as outlined above   Current Medications, Allergies, Complete Past Medical History, Past Surgical History, Family History, and Social History were reviewed in [employer] record.  ROS  The following are not active complaints unless bolded sore throat, dysphagia, dental problems, itching, sneezing,  nasal congestion or excess/ purulent secretions, ear ache,   fever, chills, sweats, unintended wt loss, classically pleuritic or exertional cp, hemoptysis,  orthopnea pnd or leg swelling, presyncope, palpitations, abdominal pain, anorexia, nausea, vomiting, diarrhea  or change in bowel or bladder habits, change in stools or urine, dysuria,hematuria,  rash, arthralgias, visual complaints, headache, numbness, weakness or ataxia or problems with walking or coordination,  change in mood/affect or memory.             Objective:   Physical Exam GEN: A/Ox3; pleasant , NAD, elderly wm with classic pseudowheezing   Wt Readings from  Last 3 Encounters:  11/20/14 201 lb (91.173 kg)  11/20/14 200 lb (90.719 kg)  04/29/14 207 lb (93.895 kg)    Vital signs reviewed    HEENT:  [place]/AT,  EACs-clear, TMs-wnl, NOSE-clear drainage  THROAT-clear, no lesions, no postnasal drip or exudate noted.   NECK:  Supple w/ fair ROM; no JVD; normal carotid impulses w/o bruits; no thyromegaly or nodules palpated; no lymphadenopathy.  RESP  Diminished BS in bases , no wheezing no accessory muscle use, no dullness to percussion -   CARD:  RRR, no m/r/g  , no peripheral edema, pulses intact, no cyanosis or clubbing.  GI:  Mod obese/  Soft & nt; nml  bowel sounds; no organomegaly or masses detected./ pos hoover's at end insp   Musco: Warm bil, no deformities or joint swelling noted.   Neuro: alert, no focal deficits noted.    Skin: Warm, no lesions or rashes      I personally reviewed images and agree with radiology impression as follows:  CT chest 12/13/13 1. No evidence of recurrence of lung carcinoma is seen. Linear scarring is stable at the site of wedge resection within the left upper lobe. 2. The parenchymal infiltrate on the prior CT within the superior segment of the left lower lobe has resolved. 3. Centrilobular emphysema. 4. Faintly calcified gallstones in the gallbladder. 5. Coronary artery calcification     Assessment & Plan:

## 2014-11-20 NOTE — Patient Instructions (Addendum)
Medication Instructions:  Your physician has recommended you make the following change in your medication:  1) TAKE CHANTIX AS DIRECTED  Labwork: TODAY: BMET, CBC, LFTs, Lipids  Testing/Procedures: Your physician has requested that you have a lower extremity arterial duplex. During this test, ultrasound is used to evaluate arterial blood flow in the legs. Allow one hour for this exam. There are no restrictions or special instructions.   Dr. Radford Pax recommends you have an ABDOMINAL DUPLEX.  Follow-Up: Your physician wants you to follow-up in: 6 months with Dr. Radford Pax. You will receive a reminder letter in the mail two months in advance. If you don't receive a letter, please call our office to schedule the follow-up appointment.   Any Other Special Instructions Will Be Listed Below (If Applicable). Smoking Cessation, Tips for Success If you are ready to quit smoking, congratulations! You have chosen to help yourself be healthier. Cigarettes bring nicotine, tar, carbon monoxide, and other irritants into your body. Your lungs, heart, and blood vessels will be able to work better without these poisons. There are many different ways to quit smoking. Nicotine gum, nicotine patches, a nicotine inhaler, or nicotine nasal spray can help with physical craving. Hypnosis, support groups, and medicines help break the habit of smoking. WHAT THINGS CAN I DO TO MAKE QUITTING EASIER?  Here are some tips to help you quit for good:  Pick a date when you will quit smoking completely. Tell all of your friends and family about your plan to quit on that date.  Do not try to slowly cut down on the number of cigarettes you are smoking. Pick a quit date and quit smoking completely starting on that day.  Throw away all cigarettes.   Clean and remove all ashtrays from your home, work, and car.  On a card, write down your reasons for quitting. Carry the card with you and read it when you get the urge to  smoke.  Cleanse your body of nicotine. Drink enough water and fluids to keep your urine clear or pale yellow. Do this after quitting to flush the nicotine from your body.  Learn to predict your moods. Do not let a bad situation be your excuse to have a cigarette. Some situations in your life might tempt you into wanting a cigarette.  Never have "just one" cigarette. It leads to wanting another and another. Remind yourself of your decision to quit.  Change habits associated with smoking. If you smoked while driving or when feeling stressed, try other activities to replace smoking. Stand up when drinking your coffee. Brush your teeth after eating. Sit in a different chair when you read the paper. Avoid alcohol while trying to quit, and try to drink fewer caffeinated beverages. Alcohol and caffeine may urge you to smoke.  Avoid foods and drinks that can trigger a desire to smoke, such as sugary or spicy foods and alcohol.  Ask people who smoke not to smoke around you.  Have something planned to do right after eating or having a cup of coffee. For example, plan to take a walk or exercise.  Try a relaxation exercise to calm you down and decrease your stress. Remember, you may be tense and nervous for the first 2 weeks after you quit, but this will pass.  Find new activities to keep your hands busy. Play with a pen, coin, or rubber band. Doodle or draw things on paper.  Brush your teeth right after eating. This will help cut down on the  craving for the taste of tobacco after meals. You can also try mouthwash.   Use oral substitutes in place of cigarettes. Try using lemon drops, carrots, cinnamon sticks, or chewing gum. Keep them handy so they are available when you have the urge to smoke.  When you have the urge to smoke, try deep breathing.  Designate your home as a nonsmoking area.  If you are a heavy smoker, ask your health care provider about a prescription for nicotine chewing gum. It can  ease your withdrawal from nicotine.  Reward yourself. Set aside the cigarette money you save and buy yourself something nice.  Look for support from others. Join a support group or smoking cessation program. Ask someone at home or at work to help you with your plan to quit smoking.  Always ask yourself, "Do I need this cigarette or is this just a reflex?" Tell yourself, "Today, I choose not to smoke," or "I do not want to smoke." You are reminding yourself of your decision to quit.  Do not replace cigarette smoking with electronic cigarettes (commonly called e-cigarettes). The safety of e-cigarettes is unknown, and some may contain harmful chemicals.  If you relapse, do not give up! Plan ahead and think about what you will do the next time you get the urge to smoke. HOW WILL I FEEL WHEN I QUIT SMOKING? You may have symptoms of withdrawal because your body is used to nicotine (the addictive substance in cigarettes). You may crave cigarettes, be irritable, feel very hungry, cough often, get headaches, or have difficulty concentrating. The withdrawal symptoms are only temporary. They are strongest when you first quit but will go away within 10-14 days. When withdrawal symptoms occur, stay in control. Think about your reasons for quitting. Remind yourself that these are signs that your body is healing and getting used to being without cigarettes. Remember that withdrawal symptoms are easier to treat than the major diseases that smoking can cause.  Even after the withdrawal is over, expect periodic urges to smoke. However, these cravings are generally short lived and will go away whether you smoke or not. Do not smoke! WHAT RESOURCES ARE AVAILABLE TO HELP ME QUIT SMOKING? Your health care provider can direct you to community resources or hospitals for support, which may include:  Group support.  Education.  Hypnosis.  Therapy.   This information is not intended to replace advice given to you by  your health care provider. Make sure you discuss any questions you have with your health care provider.   Document Released: 09/19/2003 Document Revised: 01/11/2014 Document Reviewed: 06/08/2012 Elsevier Interactive Patient Education [employer].     If you need a refill on your cardiac medications before your next appointment, please call your pharmacy.

## 2014-11-20 NOTE — Patient Instructions (Signed)
The key is to stop smoking completely before smoking completely stops you!   Stop lisinopril and start ibesartan 75 mg daily - ok to break in half if too strong and just take a half pill daily    If you are satisfied with your treatment plan,  let your doctor know and he/she can either refill your medications or you can return here when your prescription runs out.     If in any way you are not 100% satisfied,  please tell us.  If 100% better, tell your friends!  Pulmonary follow up is as needed

## 2014-11-20 NOTE — Progress Notes (Signed)
Cardiology Office Note   Date:  11/20/2014   ID:  Travis [name], DOB 29-Oct-1940, MRN [id number]  PCP:  Abigail Miyamoto, MD    Chief Complaint  Patient presents with  . Coronary Artery Disease  . Hypertension  . Hyperlipidemia      HPI:  This is a 74yo WM with a history of OSA intolerant to CPAP, dyslipidemia, PAF, HTN, dyslipidemia and CAD s/p PCI of the RCA 1994 with repeat cath 1999 with occluded RCA with left to right collaterals, PCI of LAD 2007 and PCI of LAD 2008 for instent thrombosis. He presents today for followup. He is doing well. He denies any chest pain, SOB, DOE (except with extreme exertion), LE edema, dizziness, palpitations or syncope. He continues to smoke 1ppd.    Past Medical History  Diagnosis Date  . Dyslipidemia   . OSA (obstructive sleep apnea)     intolerant to CPAP  . Psoriasis   . Insomnia   . Lung cancer [facility])     s/p lobectomy, chemo and radiation therapy  . PAF (paroxysmal atrial fibrillation) ([place])   . Hypertension   . Hyperlipidemia   . CAD (coronary artery disease)     s/p RCA atherectomy 1994, cath 1999 with occluded RCA with left to right collaterals, PCI of LAD 2007, s/p PCI of LAD for instent thrombosis 2008    Past Surgical History  Procedure Laterality Date  . Cardiac catheterization  1999  . S/p lung lobectomy  2007  . Multiple pct's to rca and lad  2007/2008  . Exploratory laparotomy      secondary to trauma  . Appendectomy    . Video bronchoscopy N/A 08/28/2012    Procedure: VIDEO BRONCHOSCOPY WITHOUT FLUORO;  Surgeon: Elsie Stain, MD;  Location: [place];  Service: Cardiopulmonary;  Laterality: N/A;     Current Outpatient Prescriptions  Medication Sig Dispense Refill  . aspirin 81 MG tablet Take 81 mg by mouth daily.      Marland Kitchen dronedarone (MULTAQ) 400 MG tablet Take 400 mg by mouth 2 (two) times daily.     Marland Kitchen lisinopril (PRINIVIL,ZESTRIL) 10 MG tablet Take 10 mg by mouth daily.    .  pantoprazole (PROTONIX) 40 MG tablet Take 40 mg by mouth daily.      Marland Kitchen PRADAXA 150 MG CAPS capsule TAKE 1 CAPSULE BY MOUTH TWICE A DAY 60 capsule 1  . rosuvastatin (CRESTOR) 5 MG tablet Take 5 mg by mouth daily.    . traZODone (DESYREL) 150 MG tablet Take 150 mg by mouth at bedtime.     No current facility-administered medications for this visit.    Allergies:   Review of patient's allergies indicates no known allergies.    Social History:  The patient  reports that he has been smoking Cigarettes.  He has a 43.5 pack-year smoking history. He quit smokeless tobacco use about 2 years ago. His smokeless tobacco use included Chew. He reports that he does not drink alcohol or use illicit drugs.   Family History:  The patient's family history includes Breast cancer in his sister; Coronary artery disease in his brother; Diabetes in his sister; Heart attack in his father and mother; Heart disease in his mother and sister; Lymphoma in his brother.    ROS:  Please see the history of present illness.   Otherwise, review of systems are positive for none.  All other systems are reviewed and negative.    PHYSICAL EXAM: VS:  BP 140/78 mmHg  Pulse 56  Ht '5\' 8"'$  (1.727 m)  Wt 90.719 kg (200 lb)  BMI 30.42 kg/m2 , BMI Body mass index is 30.42 kg/(m^2). GEN: Well nourished, well developed, in no acute distress HEENT: normal Neck: no JVD, carotid bruits, or masses Cardiac: RRR; no murmurs, rubs, or gallops,no edema.  2+ PT pulse in RLE but no palpable pulse in LLE  Respiratory:  clear to auscultation bilaterally, normal work of breathing GI: soft, nontender, nondistended, + BS MS: no deformity or atrophy Skin: warm and dry, no rash Neuro:  Strength and sensation are intact Psych: euthymic mood, full affect   EKG:  EKG is not ordered today.    Recent Labs: No results found for requested labs within last 365 days.    Lipid Panel    Component Value Date/Time   CHOL  07/17/2007 0945    103         ATP III CLASSIFICATION:  <200     mg/dL   Desirable  200-239  mg/dL   Borderline High  >=240    mg/dL   High   TRIG 121 07/17/2007 0945   HDL 30* 07/17/2007 0945   CHOLHDL 3.4 07/17/2007 0945   VLDL 24 07/17/2007 0945   LDLCALC  07/17/2007 0945    49        Total Cholesterol/HDL:CHD Risk Coronary Heart Disease Risk Table                     Men   Women  1/2 Average Risk   3.4   3.3      Wt Readings from Last 3 Encounters:  11/20/14 90.719 kg (200 lb)  04/29/14 93.895 kg (207 lb)  04/17/14 98.431 kg (217 lb)    ASSESSMENT AND PLAN:  1. ASCAD with no angina - continue ASA 2. HTN well controlled - continue Lisinopril 3. Dyslipidemia - continue crestor - check FLP and ALT  4. PAF maintaining NSR - continue Multaq/Pradaxa - Check BMET and CBC   5. Tobacco use - he continues to smoke - I recommended that we try Chantix and he is in agreement.  I discussed with him the need to cut back daily and stop by the end of the second week.     6.  Family history of AAA - I will get an abdominal US to screen and also get b/l LE arterial dopplers given no palpable pulse in LLE.  He has no complains of claudication.    Current medicines are reviewed at length with the patient today.  The patient does not have concerns regarding medicines.  The following changes have been made:  no change  Labs/ tests ordered today: See above Assessment and Plan No orders of the defined types were placed in this encounter.     Disposition:   FU with me in 6 months  Signed, Sueanne Margarita, MD  11/20/2014 9:22 AM    [facility] [address], [place], [place]  [address] Phone: [phone]; Fax: [phone]

## 2014-11-21 ENCOUNTER — Telehealth: Payer: Self-pay | Admitting: Internal Medicine

## 2014-11-21 NOTE — Assessment & Plan Note (Addendum)
PFT's  11/20/2014  FEV1 2.60 (85 % ) ratio 61  p 14 % improvement from saba with DLCO  74 % corrects to 79  % for alv volume   - spirometry 11/20/2014   FEV1  1.92 (61%) ratio 61   As I explained to this patient in detail:  although there is copd present, it may not be clinically relevant:   it does not appear to be limiting activity tolerance any more than a set of worn tires limits someone from driving a car  around a parking lot.  A new set of Michelins might look good but would have no perceived impact on the performance of the car and would not be worth the cost.  That is to say:   this pt is so sedentary I don't recommend aggressive pulmonary rx at this point unless limiting symptoms arise or acute exacerbations become as issue, neither of which is the case now.  I asked the patient to contact this office at any time in the future should either of these problems arise.    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 Will obtain abg's and approve for dot card  Pulmonary f/u can be prn

## 2014-11-21 NOTE — Assessment & Plan Note (Signed)
Complicated by osa/ hbp/hyperlipidemia   Body mass index is 30.11 Lab Results  Component Value Date   TSH 0.670  05/04/2009     Contributing to gerd tendency/ doe/reviewed the need and the process to achieve and maintain neg calorie balance > defer f/u primary care including intermittently monitoring thyroid status

## 2014-11-21 NOTE — Telephone Encounter (Signed)
Called and spoke to pt. Pt requesting surgery clearance from the cardiologist, advised pt to call cardiology to schedule an appt or call if he is needing clearance. Pt verbalized understanding and denied any further questions or concerns at this time.

## 2014-11-21 NOTE — Assessment & Plan Note (Signed)

## 2014-11-21 NOTE — Assessment & Plan Note (Addendum)
In the best review of chronic cough to date ( NEJM 2016 375 [phone]) ,  ACEi are now felt to cause cough in up to  20% of pts which is a 4 fold increase from previous reports and does not include the variety of non-specific complaints we see in pulmonary clinic in pts on ACEi but previously attributed to copd/asthma to include PNDS, throat and chest congestion, "bronchitis", unexplained dyspnea and noct "strangling" sensations as well as atypical /refractory GERD symptoms like atypical dysphagia.   The only way to prove this is not an "ACEi Case" is a trial off ACEi x a minimum of 6 weeks then regroup.  rec ibesartan 75 mg daily

## 2014-11-22 ENCOUNTER — Telehealth: Payer: Self-pay | Admitting: *Deleted

## 2014-11-22 DIAGNOSIS — J449 Chronic obstructive pulmonary disease, unspecified: Secondary | ICD-10-CM

## 2014-11-22 NOTE — Telephone Encounter (Signed)
LMTCB

## 2014-11-22 NOTE — Telephone Encounter (Signed)
-----   Message from Tanda Rockers, MD sent at 11/22/2014  7:34 AM EST ----- Needs abg's for dot card > send to [facility]

## 2014-11-25 NOTE — Telephone Encounter (Signed)
Called and spoke to pt. Informed him of the needed blood work - ABG. Pt verbalized understanding. Called [facility] and spoke to [place] and scheduled pt for ABG draw at [facility] on 11/22 at 1100. Called pt back and had to leave VM, just need to let pt know of time, date, and location of ABG draw.

## 2014-11-26 ENCOUNTER — Inpatient Hospital Stay (HOSPITAL_COMMUNITY): Admission: RE | Admit: 2014-11-26 | Payer: Medicare Other | Source: Ambulatory Visit

## 2014-11-26 ENCOUNTER — Other Ambulatory Visit: Payer: Self-pay | Admitting: Cardiology

## 2014-11-26 DIAGNOSIS — I714 Abdominal aortic aneurysm, without rupture, unspecified: Secondary | ICD-10-CM

## 2014-11-26 DIAGNOSIS — IMO0001 Reserved for inherently not codable concepts without codable children: Secondary | ICD-10-CM

## 2014-11-26 NOTE — Telephone Encounter (Signed)
ABG was not done today. Called # provided and was advised pt NA at the moment. WCB

## 2014-11-27 NOTE — Telephone Encounter (Signed)
Called and spoke to pt. Pt stated he did not make it to have ABG drawn. Called RT and spoke to [place] and a new appt for ABG was scheduled for 11/25 at 1100 at [facility]. Called and informed pt of the new appt time and date. Pt verbalized understanding.

## 2014-11-29 ENCOUNTER — Emergency Department (HOSPITAL_COMMUNITY)
Admission: EM | Admit: 2014-11-29 | Discharge: 2014-11-29 | Discharge status: Absent without leave | Payer: Medicare Other

## 2014-11-29 ENCOUNTER — Ambulatory Visit (HOSPITAL_COMMUNITY)
Admission: RE | Admit: 2014-11-29 | Discharge: 2014-11-29 | Disposition: A | Discharge status: Home / Self Care | Payer: Medicare Other | Source: Ambulatory Visit | Attending: Internal Medicine | Admitting: Internal Medicine

## 2014-11-29 DIAGNOSIS — J449 Chronic obstructive pulmonary disease, unspecified: Secondary | ICD-10-CM | POA: Diagnosis not present

## 2014-11-29 LAB — BLOOD GAS, ARTERIAL
ACID-BASE DEFICIT: 0.9 mmol/L (ref 0.0–2.0)
Bicarbonate: 23.5 mEq/L (ref 20.0–24.0)
Drawn by: 242311
FIO2: 0.21
O2 Saturation: 95.4 %
PCO2 ART: 40.1 mmHg (ref 35.0–45.0)
PO2 ART: 83 mmHg (ref 80.0–100.0)
Patient temperature: 98.6
TCO2: 21.1 mmol/L (ref 0–100)
pH, Arterial: 7.385 (ref 7.350–7.450)

## 2014-12-06 ENCOUNTER — Ambulatory Visit (HOSPITAL_COMMUNITY)
Admission: RE | Admit: 2014-12-06 | Discharge: 2014-12-06 | Disposition: A | Discharge status: Home / Self Care | Payer: Medicare Other | Source: Ambulatory Visit | Attending: Cardiology | Admitting: Cardiology

## 2014-12-06 DIAGNOSIS — G4733 Obstructive sleep apnea (adult) (pediatric): Secondary | ICD-10-CM | POA: Insufficient documentation

## 2014-12-06 DIAGNOSIS — IMO0001 Reserved for inherently not codable concepts without codable children: Secondary | ICD-10-CM

## 2014-12-06 DIAGNOSIS — E785 Hyperlipidemia, unspecified: Secondary | ICD-10-CM | POA: Diagnosis not present

## 2014-12-06 DIAGNOSIS — I700 Atherosclerosis of aorta: Secondary | ICD-10-CM | POA: Insufficient documentation

## 2014-12-06 DIAGNOSIS — R0989 Other specified symptoms and signs involving the circulatory and respiratory systems: Secondary | ICD-10-CM

## 2014-12-06 DIAGNOSIS — Z8249 Family history of ischemic heart disease and other diseases of the circulatory system: Secondary | ICD-10-CM | POA: Insufficient documentation

## 2014-12-06 DIAGNOSIS — I714 Abdominal aortic aneurysm, without rupture, unspecified: Secondary | ICD-10-CM

## 2014-12-06 DIAGNOSIS — I10 Essential (primary) hypertension: Secondary | ICD-10-CM | POA: Insufficient documentation

## 2014-12-12 ENCOUNTER — Telehealth: Payer: Self-pay

## 2014-12-12 DIAGNOSIS — I714 Abdominal aortic aneurysm, without rupture, unspecified: Secondary | ICD-10-CM

## 2014-12-12 NOTE — Telephone Encounter (Signed)
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  Aorto-iliac atherosclerosis.  Normal caliber common and external iliac arteries, bilaterally.  Please refer to Vascular surgery to follow - Dr. Donnetta Hutching.

## 2014-12-12 NOTE — Telephone Encounter (Signed)
Informed patient of results and verbal understanding expressed.  Referral for Dr. Donnetta Hutching placed. Patient agrees with treatment plan.

## 2014-12-18 ENCOUNTER — Other Ambulatory Visit: Payer: Self-pay | Admitting: *Deleted

## 2014-12-18 MED ORDER — DABIGATRAN ETEXILATE MESYLATE 150 MG PO CAPS: 150.0000 mg | ORAL_CAPSULE | Freq: Two times a day (BID) | ORAL | Status: DC

## 2014-12-23 ENCOUNTER — Telehealth: Payer: Self-pay

## 2014-12-23 NOTE — Telephone Encounter (Signed)
Received this message for documentation:  Pegram, Sonya T - pt has declined to schedule >','<< Less Detail',event)" href="javascript:;">More Detail >>   pt has declined to schedule    Jeanie Sewer    Sent: Fri December 20, 2014 9:35 AM    To: Theodoro Parma, RN        Message     Katie, [facility] is attempting to call pt but so far pt has declined to schedule just a FYI.Davy Pique.

## 2015-02-26 ENCOUNTER — Telehealth: Payer: Self-pay | Admitting: Cardiology

## 2015-02-26 NOTE — Telephone Encounter (Signed)
Yes - please check with insurance which will be cheaper

## 2015-02-26 NOTE — Telephone Encounter (Signed)
New message      Pt's copay for pradaxa is doubling.  Will the doctor be willing to switch to eliquis or xarelto?

## 2015-03-03 ENCOUNTER — Telehealth: Payer: Self-pay | Admitting: Cardiology

## 2015-03-03 NOTE — Telephone Encounter (Signed)
New message      Calling to let doctor know that for 2017 pradaxa will cost more.  [employer] is recommending eliquis or xarelto.  They will fax forms to change rx.

## 2015-03-04 NOTE — Telephone Encounter (Signed)
Noted and will await faxes.

## 2015-03-06 NOTE — Telephone Encounter (Signed)
Message left with pharmacist Joelene Millin, to call me back tomorrow

## 2015-03-07 ENCOUNTER — Other Ambulatory Visit: Payer: Self-pay

## 2015-03-07 MED ORDER — XARELTO 20 MG PO TABS: 20.0000 mg | ORAL_TABLET | Freq: Every day | ORAL | Status: DC

## 2015-03-07 NOTE — Telephone Encounter (Signed)
Per verbal order of Dr. Radford Pax, will switch from Pradaxa to Xarelto '20mg'$  daily. Patient notified. New rx sent to [facility]. Prior auth sent to [facility].

## 2015-03-10 ENCOUNTER — Telehealth: Payer: Self-pay

## 2015-03-10 NOTE — Telephone Encounter (Signed)
Xarelto approved through 01/04/2016. PA [id number].

## 2015-03-19 ENCOUNTER — Other Ambulatory Visit: Payer: Self-pay | Admitting: Internal Medicine

## 2015-04-18 ENCOUNTER — Other Ambulatory Visit: Payer: Self-pay | Admitting: Internal Medicine

## 2015-05-19 ENCOUNTER — Other Ambulatory Visit: Payer: Self-pay | Admitting: Internal Medicine

## 2015-05-22 ENCOUNTER — Other Ambulatory Visit: Payer: Self-pay | Admitting: Internal Medicine

## 2015-06-11 ENCOUNTER — Other Ambulatory Visit: Payer: Self-pay | Admitting: Cardiology

## 2015-07-11 ENCOUNTER — Other Ambulatory Visit: Payer: Self-pay | Admitting: *Deleted

## 2015-07-11 MED ORDER — DRONEDARONE HCL 400 MG PO TABS: ORAL_TABLET | ORAL | Status: DC

## 2015-08-07 ENCOUNTER — Telehealth: Payer: Self-pay | Admitting: Cardiology

## 2015-08-07 NOTE — Telephone Encounter (Signed)
New message   Pt wife verbalized that pt has been having SOB while he has been on vacation and it has ended because of it, when ever he walked it would give him the SOb pt wife stated TODAY that he is NOT having any of the issues today   Pt c/o Shortness Of Breath: STAT if SOB developed within the last 24 hours or pt is noticeably SOB on the phone  1. Are you currently SOB (can you hear that pt is SOB on the phone)? no  2. How long have you been experiencing SOB? 2 weeks  3. Are you SOB when sitting or when up moving around? Moving around  4. Are you currently experiencing any other symptoms? no

## 2015-08-07 NOTE — Telephone Encounter (Signed)
Left message to call back  

## 2015-08-08 NOTE — Telephone Encounter (Signed)
F/u message ° °Pt returning RN call .please call back to discuss  °

## 2015-08-08 NOTE — Telephone Encounter (Signed)
Follow-up     The wife is returning the nurses phone call

## 2015-08-08 NOTE — Telephone Encounter (Signed)
Patient reports worsening SOB on exertion and CP on exertion that resolves with rest. He states he seems to "give out" while walking he is so SOB. He reports no swelling, no weight gain, no other symptoms.  He is asymptomatic now. He went to PCP for skin rash Monday and his BP was 138/74.  His PCP called and scheduled an appointment at [facility] but it was not until September. Patient is concerned and would like to be seen earlier.  Scheduled patient to see Dr. Radford Pax Monday, August 7 at [place]. He understands to take it easy over the weekend and to seek immediate medical attention if SOB or CP occurs that does not resolve with rest.  He was grateful for assistance.

## 2015-08-10 ENCOUNTER — Encounter: Payer: Self-pay | Admitting: Cardiology

## 2015-08-10 NOTE — Progress Notes (Signed)
Cardiology Office Note    Date:  08/11/2015   ID:  Travis [name], DOB 1940-09-16, MRN [id number]  PCP:  Abigail Miyamoto, MD  Cardiologist:  Fransico Him, MD   Chief Complaint  Patient presents with  . Coronary Artery Disease  . Atrial Fibrillation  . Hypertension  . Hyperlipidemia    History of Present Illness:  Travis [name] is a 75 y.o. male with a history of OSA intolerant to CPAP, dyslipidemia, PAF, HTN, dyslipidemia and CAD s/p PCI of the RCA 1994 with repeat cath 1999 with occluded RCA with left to right collaterals, PCI of LAD 2007 and PCI of LAD 2008 for instent thrombosis. He presents today for followup. He is doing well. He says for the past month he has had exertional fatigue with decreased exercise tolerance.  He has also noticed chest discomfort with ambulation that is new. He is also having DOE which is new for him.  He is very frustrated that he cannot do anything.  He denies any LE edema, dizziness, palpitations or syncope. He continues to smoke 1ppd.     Past Medical History:  Diagnosis Date  . AAA family hx 11/20/2014  . CAD (coronary artery disease)    s/p RCA atherectomy 1994, cath 1999 with occluded RCA with left to right collaterals, PCI of LAD 2007, s/p PCI of LAD for instent thrombosis 2008  . Dyslipidemia   . Hyperlipidemia   . Hypertension   . Insomnia   . Lung cancer [facility])    s/p lobectomy, chemo and radiation therapy  . OSA (obstructive sleep apnea)    intolerant to CPAP  . PAF (paroxysmal atrial fibrillation) ([place])   . Psoriasis     Past Surgical History:  Procedure Laterality Date  . APPENDECTOMY    . CARDIAC CATHETERIZATION  1999  . EXPLORATORY LAPAROTOMY     secondary to trauma  . multiple PCT's to RCA and LAD  2007/2008  . s/p lung lobectomy  2007  . VIDEO BRONCHOSCOPY N/A 08/28/2012   Procedure: VIDEO BRONCHOSCOPY WITHOUT FLUORO;  Surgeon: Elsie Stain, MD;  Location: [place];  Service: Cardiopulmonary;  Laterality:  N/A;    Current Medications: Outpatient Medications Prior to Visit  Medication Sig Dispense Refill  . aspirin 81 MG tablet Take 81 mg by mouth daily.      Marland Kitchen dronedarone (MULTAQ) 400 MG tablet TAKE 1 TABLET BY MOUTH TWICE A DAY 180 tablet 0  . pantoprazole (PROTONIX) 40 MG tablet Take 40 mg by mouth daily.      . rosuvastatin (CRESTOR) 5 MG tablet Take 5 mg by mouth daily.    . traZODone (DESYREL) 150 MG tablet Take 150 mg by mouth at bedtime.    . varenicline (CHANTIX CONTINUING MONTH PAK) 1 MG tablet Take 1 tablet (1 mg total) by mouth 2 (two) times daily. 60 tablet 1  . irbesartan (AVAPRO) 75 MG tablet TAKE 1 TABLET (75 MG TOTAL) BY MOUTH DAILY. (Patient not taking: Reported on 08/11/2015) 30 tablet 0  . varenicline (CHANTIX STARTING MONTH PAK) 0.5 MG X 11 & 1 MG X 42 tablet Take one 0.5 mg tablet by mouth once daily for 3 days, then increase to one 0.5 mg tablet twice daily for 4 days, then increase to one 1 mg tablet twice daily. (Patient not taking: Reported on 11/20/2014) 53 tablet 0  . XARELTO 20 MG TABS tablet Take 1 tablet (20 mg total) by mouth daily with supper. (Patient not taking: Reported  on 08/11/2015) 30 tablet 8   No facility-administered medications prior to visit.      Allergies:   Review of patient's allergies indicates no known allergies.   Social History   Social History  . Marital status: Married    Spouse name: N/A  . Number of children: 3  . Years of education: N/A   Occupational History  . [employer]   Social History Main Topics  . Smoking status: Current Every Day Smoker    Packs/day: 0.75    Years: 58.00    Types: Cigarettes  . Smokeless tobacco: Former Systems developer    Types: Chew    Quit date: 07/04/2012     Comment: started smoking at age 75.  Marland Kitchen Alcohol use No  . Drug use: No  . Sexual activity: Not Asked   Other Topics Concern  . None   Social History Narrative  . None     Family History:  The patient's family history includes Breast  cancer in his sister; Coronary artery disease in his brother; Diabetes in his sister; Heart attack in his father and mother; Heart disease in his mother and sister; Lymphoma in his brother.   ROS:   Please see the history of present illness.    ROS All other systems reviewed and are negative.   PHYSICAL EXAM:   VS:  BP 134/82   Pulse (!) 54   Ht 5' 8.5" (1.74 m)   Wt 207 lb (93.9 kg)   BMI 31.02 kg/m    GEN: Well nourished, well developed, in no acute distress  HEENT: normal  Neck: no JVD, carotid bruits, or masses Cardiac: RRR; no murmurs, rubs, or gallops,no edema.  Intact distal pulses bilaterally.  Respiratory:  clear to auscultation bilaterally, normal work of breathing GI: soft, nontender, nondistended, + BS MS: no deformity or atrophy  Skin: warm and dry, no rash Neuro:  Alert and Oriented x 3, Strength and sensation are intact Psych: euthymic mood, full affect  Wt Readings from Last 3 Encounters:  08/11/15 207 lb (93.9 kg)  11/20/14 201 lb (91.2 kg)  11/20/14 200 lb (90.7 kg)      Studies/Labs Reviewed:   EKG:  EKG is ordered today.  The ekg ordered today demonstrates sinus bracycardia at 54bpm with PAC's and no ST changes.  Recent Labs: 11/20/2014: ALT 11; BUN 11; Creat 1.14; Hemoglobin 12.8; Platelets 158; Potassium 4.1; Sodium 140   Lipid Panel    Component Value Date/Time   CHOL 89 (L) 11/20/2014 1015   TRIG 65 11/20/2014 1015   HDL 33 (L) 11/20/2014 1015   CHOLHDL 2.7 11/20/2014 1015   VLDL 13 11/20/2014 1015   LDLCALC 43 11/20/2014 1015    Additional studies/ records that were reviewed today include:  none    ASSESSMENT:    1. CAD S/P percutaneous coronary angioplasty   2. Essential hypertension   3. PAF (paroxysmal atrial fibrillation) ([place])   4. Dyslipidemia      PLAN:  In order of problems listed above:  1. ASCAD s/p RCA atherectomy 1994, cath 1999 with occluded RCA with left to right collaterals, PCI of LAD 2007, s/p PCI of LAD for  instent thrombosis 2008.  He is now having anginal symptoms with CP and DOE as well as exertional fatigue.  I will get a nuclear stress test and echo to assess.  Continue ASA/statin. 2. HTN - BP controlled on current medical regimen.  Continue ARB.   3. PAF -  maintaining NSR.  Continue Multaq and Xarelto.  Check NOAC panel.   4. Dyslipidemia with LDL goal < 70.  He had run out of his statin for 6 months and is now back on it.  Check FLP and ALT in 6 weeks now that he is back on it.     Medication Adjustments/Labs and Tests Ordered: Current medicines are reviewed at length with the patient today.  Concerns regarding medicines are outlined above.  Medication changes, Labs and Tests ordered today are listed in the Patient Instructions below.  There are no Patient Instructions on file for this visit.   Signed, Fransico Him, MD  08/11/2015 8:52 AM    [facility] [place], [place], [place]  [address] Phone: [phone]; Fax: [phone]

## 2015-08-11 ENCOUNTER — Ambulatory Visit (INDEPENDENT_AMBULATORY_CARE_PROVIDER_SITE_OTHER): Payer: Medicare Other | Admitting: Cardiology

## 2015-08-11 ENCOUNTER — Encounter: Payer: Self-pay | Admitting: Cardiology

## 2015-08-11 ENCOUNTER — Telehealth (HOSPITAL_COMMUNITY): Payer: Self-pay | Admitting: *Deleted

## 2015-08-11 VITALS — BP 134/82 | HR 54 | Ht 68.5 in | Wt 207.0 lb

## 2015-08-11 DIAGNOSIS — I10 Essential (primary) hypertension: Secondary | ICD-10-CM

## 2015-08-11 DIAGNOSIS — R079 Chest pain, unspecified: Secondary | ICD-10-CM

## 2015-08-11 DIAGNOSIS — E785 Hyperlipidemia, unspecified: Secondary | ICD-10-CM

## 2015-08-11 DIAGNOSIS — Z9861 Coronary angioplasty status: Secondary | ICD-10-CM

## 2015-08-11 DIAGNOSIS — I480 Paroxysmal atrial fibrillation: Secondary | ICD-10-CM | POA: Diagnosis not present

## 2015-08-11 DIAGNOSIS — I2510 Atherosclerotic heart disease of native coronary artery without angina pectoris: Secondary | ICD-10-CM

## 2015-08-11 DIAGNOSIS — R0602 Shortness of breath: Secondary | ICD-10-CM

## 2015-08-11 LAB — CBC WITH DIFFERENTIAL/PLATELET
BASOS PCT: 0 %
Basophils Absolute: 0 cells/uL (ref 0–200)
EOS ABS: 368 {cells}/uL (ref 15–500)
Eosinophils Relative: 4 %
HCT: 36.8 % — ABNORMAL LOW (ref 38.5–50.0)
Hemoglobin: 11.3 g/dL — ABNORMAL LOW (ref 13.2–17.1)
LYMPHS ABS: 1932 {cells}/uL (ref 850–3900)
Lymphocytes Relative: 21 %
MCH: 23.9 pg — ABNORMAL LOW (ref 27.0–33.0)
MCHC: 30.7 g/dL — ABNORMAL LOW (ref 32.0–36.0)
MCV: 77.8 fL — AB (ref 80.0–100.0)
MONO ABS: 828 {cells}/uL (ref 200–950)
MONOS PCT: 9 %
MPV: 9.8 fL (ref 7.5–12.5)
NEUTROS ABS: 6072 {cells}/uL (ref 1500–7800)
Neutrophils Relative %: 66 %
PLATELETS: 211 10*3/uL (ref 140–400)
RBC: 4.73 MIL/uL (ref 4.20–5.80)
RDW: 15.3 % — ABNORMAL HIGH (ref 11.0–15.0)
WBC: 9.2 10*3/uL (ref 3.8–10.8)

## 2015-08-11 LAB — BASIC METABOLIC PANEL
BUN: 13 mg/dL (ref 7–25)
CHLORIDE: 106 mmol/L (ref 98–110)
CO2: 26 mmol/L (ref 20–31)
CREATININE: 1.08 mg/dL (ref 0.70–1.18)
Calcium: 9 mg/dL (ref 8.6–10.3)
Glucose, Bld: 101 mg/dL — ABNORMAL HIGH (ref 65–99)
POTASSIUM: 4.7 mmol/L (ref 3.5–5.3)
SODIUM: 142 mmol/L (ref 135–146)

## 2015-08-11 NOTE — Patient Instructions (Signed)
Medication Instructions:  Your physician recommends that you continue on your current medications as directed. Please refer to the Current Medication list given to you today.   Labwork: TODAY: BMET, CBC  Your physician recommends that you return for fASTING lab work in 6 weeks  Testing/Procedures: Your physician has requested that you have an echocardiogram. Echocardiography is a painless test that uses sound waves to create images of your heart. It provides your doctor with information about the size and shape of your heart and how well your heart's chambers and valves are working. This procedure takes approximately one hour. There are no restrictions for this procedure.  Your physician has requested that you have a lexiscan myoview. For further information please visit [email or web address]. Please follow instruction sheet, as given.  Follow-Up: Your physician wants you to follow-up in: 6 months with Dr. Radford Pax. You will receive a reminder letter in the mail two months in advance. If you don't receive a letter, please call our office to schedule the follow-up appointment.   Any Other Special Instructions Will Be Listed Below (If Applicable).     If you need a refill on your cardiac medications before your next appointment, please call your pharmacy.

## 2015-08-11 NOTE — Telephone Encounter (Signed)
Patient given detailed instructions per Myocardial Perfusion Study Information Sheet for the test on 08/13/15 at 0945. Patient notified to arrive 15 minutes early and that it is imperative to arrive on time for appointment to keep from having the test rescheduled.  If you need to cancel or reschedule your appointment, please call the office within 24 hours of your appointment. Failure to do so may result in a cancellation of your appointment, and a $50 no show fee. Patient verbalized understanding.[place], [name]

## 2015-08-11 NOTE — Telephone Encounter (Signed)
Left message on voicemail in reference to upcoming appointment scheduled for 08/13/15. Phone number given for a call back so details instructions can be given. Travis [name], [name]

## 2015-08-13 ENCOUNTER — Ambulatory Visit (HOSPITAL_COMMUNITY): Payer: Medicare Other | Attending: Cardiology

## 2015-08-13 DIAGNOSIS — I10 Essential (primary) hypertension: Secondary | ICD-10-CM | POA: Diagnosis not present

## 2015-08-13 DIAGNOSIS — R0602 Shortness of breath: Secondary | ICD-10-CM | POA: Diagnosis not present

## 2015-08-13 DIAGNOSIS — R079 Chest pain, unspecified: Secondary | ICD-10-CM | POA: Diagnosis not present

## 2015-08-13 DIAGNOSIS — R0609 Other forms of dyspnea: Secondary | ICD-10-CM | POA: Diagnosis not present

## 2015-08-13 LAB — MYOCARDIAL PERFUSION IMAGING
CHL CUP NUCLEAR SDS: 5
CHL CUP NUCLEAR SRS: 3
CHL CUP RESTING HR STRESS: 54 {beats}/min
CSEPPHR: 75 {beats}/min
LV dias vol: 139 mL (ref 62–150)
LVSYSVOL: 62 mL
RATE: 0.35
SSS: 7
TID: 1.08

## 2015-08-13 MED ORDER — TECHNETIUM TC 99M TETROFOSMIN IV KIT
10.5000 | PACK | Freq: Once | INTRAVENOUS | Status: AC | PRN
  Administered 2015-08-13: 11 via INTRAVENOUS
  Filled 2015-08-13: qty 11

## 2015-08-13 MED ORDER — TECHNETIUM TC 99M TETROFOSMIN IV KIT
32.8000 | PACK | Freq: Once | INTRAVENOUS | Status: AC | PRN
  Administered 2015-08-13: 32.8 via INTRAVENOUS
  Filled 2015-08-13: qty 33

## 2015-08-13 MED ORDER — REGADENOSON 0.4 MG/5ML IV SOLN
0.4000 mg | Freq: Once | INTRAVENOUS | Status: AC
  Administered 2015-08-13: 0.4 mg via INTRAVENOUS

## 2015-08-19 ENCOUNTER — Telehealth: Payer: Self-pay

## 2015-08-19 ENCOUNTER — Other Ambulatory Visit: Payer: Self-pay

## 2015-08-19 ENCOUNTER — Encounter: Payer: Self-pay | Admitting: Cardiology

## 2015-08-19 ENCOUNTER — Ambulatory Visit (HOSPITAL_COMMUNITY): Payer: Medicare Other | Attending: Cardiovascular Disease

## 2015-08-19 DIAGNOSIS — R079 Chest pain, unspecified: Secondary | ICD-10-CM | POA: Insufficient documentation

## 2015-08-19 DIAGNOSIS — I2510 Atherosclerotic heart disease of native coronary artery without angina pectoris: Secondary | ICD-10-CM | POA: Diagnosis not present

## 2015-08-19 DIAGNOSIS — R0602 Shortness of breath: Secondary | ICD-10-CM | POA: Diagnosis not present

## 2015-08-19 DIAGNOSIS — I119 Hypertensive heart disease without heart failure: Secondary | ICD-10-CM | POA: Diagnosis not present

## 2015-08-19 NOTE — Telephone Encounter (Signed)
Patient is here for an echo. He is reporting that he is out of Multaq. Spoke with his wife, who states his co/pay has jumped to $600.00. She is unsure if he is in the donut hole or not. Samples provided, for 12 days. Gave them the number for the [facility]. If that doesn't work, we will attempt patient assistance.

## 2015-08-21 ENCOUNTER — Telehealth: Payer: Self-pay

## 2015-08-21 ENCOUNTER — Ambulatory Visit (INDEPENDENT_AMBULATORY_CARE_PROVIDER_SITE_OTHER): Payer: Medicare Other | Admitting: Cardiology

## 2015-08-21 ENCOUNTER — Encounter: Payer: Self-pay | Admitting: Cardiology

## 2015-08-21 VITALS — BP 142/84 | HR 62 | Ht 71.5 in | Wt 203.8 lb

## 2015-08-21 DIAGNOSIS — I272 Pulmonary hypertension, unspecified: Secondary | ICD-10-CM

## 2015-08-21 DIAGNOSIS — R9439 Abnormal result of other cardiovascular function study: Secondary | ICD-10-CM | POA: Diagnosis not present

## 2015-08-21 MED ORDER — NITROGLYCERIN 0.4 MG SL SUBL: 0.4000 mg | SUBLINGUAL_TABLET | SUBLINGUAL | 3 refills | Status: DC | PRN

## 2015-08-21 NOTE — Patient Instructions (Addendum)
Medication Instructions: Your physician recommends that you continue on your current medications as directed. Please refer to the Current Medication list given to you today.  A prescription for Nitroglycerin 0.4 mg sublingual has been sent to you pharmacy.  Labwork: None in office today but will have pre-procedural labs tomorrow at the hospital (BMET and CBC)  Procedures/Testing: Your physician has requested that you have a cardiac catheterization. Cardiac catheterization is used to diagnose and/or treat various heart conditions. Doctors may recommend this procedure for a number of different reasons. The most common reason is to evaluate chest pain. Chest pain can be a symptom of coronary artery disease (CAD), and cardiac catheterization can show whether plaque is narrowing or blocking your heart's arteries. This procedure is also used to evaluate the valves, as well as measure the blood flow and oxygen levels in different parts of your heart. For further information please visit [email or web address]. Please follow instruction sheet, as given.    Follow-Up: Your physician recommends that you schedule a follow-up appointment in 2 weeks with Dr. Radford Pax   Any Additional Special Instructions Will Be Listed Below (If Applicable).  Nitroglycerin sublingual tablets What is this medicine? NITROGLYCERIN (nye troe GLI ser in) is a type of vasodilator. It relaxes blood vessels, increasing the blood and oxygen supply to your heart. This medicine is used to relieve chest pain caused by angina. It is also used to prevent chest pain before activities like climbing stairs, going outdoors in cold weather, or sexual activity. This medicine may be used for other purposes; ask your health care provider or pharmacist if you have questions. What should I tell my health care provider before I take this medicine? They need to know if you have any of these conditions: -anemia -head injury, recent stroke, or bleeding  in the brain -liver disease -previous heart attack -an unusual or allergic reaction to nitroglycerin, other medicines, foods, dyes, or preservatives -pregnant or trying to get pregnant -breast-feeding How should I use this medicine? Take this medicine by mouth as needed. At the first sign of an angina attack (chest pain or tightness) place one tablet under your tongue. You can also take this medicine 5 to 10 minutes before an event likely to produce chest pain. Follow the directions on the prescription label. Let the tablet dissolve under the tongue. Do not swallow whole. Replace the dose if you accidentally swallow it. It will help if your mouth is not dry. Saliva around the tablet will help it to dissolve more quickly. Do not eat or drink, smoke or chew tobacco while a tablet is dissolving. If you are not better within 5 minutes after taking ONE dose of nitroglycerin, call 9-1-1 immediately to seek emergency medical care. Do not take more than 3 nitroglycerin tablets over 15 minutes. If you take this medicine often to relieve symptoms of angina, your doctor or health care professional may provide you with different instructions to manage your symptoms. If symptoms do not go away after following these instructions, it is important to call 9-1-1 immediately. Do not take more than 3 nitroglycerin tablets over 15 minutes. Talk to your pediatrician regarding the use of this medicine in children. Special care may be needed. Overdosage: If you think you have taken too much of this medicine contact a poison control center or emergency room at once. NOTE: This medicine is only for you. Do not share this medicine with others. What if I miss a dose? This does not apply. This medicine is only  used as needed. What may interact with this medicine? Do not take this medicine with any of the following medications: -certain migraine medicines like ergotamine and dihydroergotamine (DHE) -medicines used to treat  erectile dysfunction like sildenafil, tadalafil, and vardenafil -riociguat This medicine may also interact with the following medications: -alteplase -aspirin -heparin -medicines for high blood pressure -medicines for mental depression -other medicines used to treat angina -phenothiazines like chlorpromazine, mesoridazine, prochlorperazine, thioridazine This list may not describe all possible interactions. Give your health care provider a list of all the medicines, herbs, non-prescription drugs, or dietary supplements you use. Also tell them if you smoke, drink alcohol, or use illegal drugs. Some items may interact with your medicine. What should I watch for while using this medicine? Tell your doctor or health care professional if you feel your medicine is no longer working. Keep this medicine with you at all times. Sit or lie down when you take your medicine to prevent falling if you feel dizzy or faint after using it. Try to remain calm. This will help you to feel better faster. If you feel dizzy, take several deep breaths and lie down with your feet propped up, or bend forward with your head resting between your knees. You may get drowsy or dizzy. Do not drive, use machinery, or do anything that needs mental alertness until you know how this drug affects you. Do not stand or sit up quickly, especially if you are an older patient. This reduces the risk of dizzy or fainting spells. Alcohol can make you more drowsy and dizzy. Avoid alcoholic drinks. Do not treat yourself for coughs, colds, or pain while you are taking this medicine without asking your doctor or health care professional for advice. Some ingredients may increase your blood pressure. What side effects may I notice from receiving this medicine? Side effects that you should report to your doctor or health care professional as soon as possible: -blurred vision -dry mouth -skin rash -sweating -the feeling of extreme pressure in the  head -unusually weak or tired Side effects that usually do not require medical attention (report to your doctor or health care professional if they continue or are bothersome): -flushing of the face or neck -headache -irregular heartbeat, palpitations -nausea, vomiting This list may not describe all possible side effects. Call your doctor for medical advice about side effects. You may report side effects to FDA at 1-800-FDA-1088. Where should I keep my medicine? Keep out of the reach of children. Store at room temperature between 20 and 25 degrees C (68 and 77 degrees F). Store in Chief of Staff. Protect from light and moisture. Keep tightly closed. Throw away any unused medicine after the expiration date. NOTE: This sheet is a summary. It may not cover all possible information. If you have questions about this medicine, talk to your doctor, pharmacist, or health care provider.    2016, Elsevier/Gold Standard. (2012-10-19 17:57:36)      If you need a refill on your cardiac medications before your next appointment, please call your pharmacy.

## 2015-08-21 NOTE — Progress Notes (Signed)
08/21/2015 Travis [name]   04/01/1940  [id number]  Primary Physician FRIED, Jaymes Graff, MD Primary Cardiologist: Dr. Radford Pax   Reason for Visit/CC: Exertional Angina; Abnormal NST  HPI:  Travis [name] is a 75 y.o. male with a history of OSA intolerant to CPAP, dyslipidemia, PAF, HTN, dyslipidemia and CAD s/p PCI of the RCA 1994 with repeat cath 1999 with occluded RCA with left to right collaterals, PCI of LAD 2007 and PCI of LAD 2008 for instent thrombosis. He continues to smoke 1ppd. He also has paroxysmal atrial fibrillation on antiarrhythmic drug therapy with Multaq. He is on Xarelto for anticoagulation. He also has a history of hyperlipidemia that is treated with Crestor.  He was recently seen by Dr. Radford Pax on 08/12/2015 and complained of new dyspnea on exertion/decreased exercise tolerance. Dr. Radford Pax ordered for him to undergo a nuclear stress test and a 2-D echocardiogram to further assess. Stress test was abnormal with a medium defect of moderate severity present in the basal inferior, mid inferior and apical inferior location. Findings consistent with ischemia. Echocardiogram revealed normal left systolic function with estimated ejection fraction of 50-55%. Dr. Radford Pax has recommended that the patient undergo definitive left heart catheterization. He presents back to clinic today with his wife to discussed the results/ indication for procedure.   He continues to have stable, exertional angina and dyspnea. Symptoms improve immediately after resting. He denies any resting symptoms. His last dose of Xarelto was last night.     Current Outpatient Prescriptions  Medication Sig Dispense Refill  . aspirin 81 MG tablet Take 81 mg by mouth daily.      Marland Kitchen dronedarone (MULTAQ) 400 MG tablet TAKE 1 TABLET BY MOUTH TWICE A DAY 180 tablet 0  . pantoprazole (PROTONIX) 40 MG tablet Take 40 mg by mouth daily.      . rosuvastatin (CRESTOR) 5 MG tablet Take 5 mg by mouth daily.    . traZODone  (DESYREL) 150 MG tablet Take 150 mg by mouth at bedtime.    Alveda Reasons 20 MG TABS tablet Take 1 tablet by mouth daily.    . nitroGLYCERIN (NITROSTAT) 0.4 MG SL tablet Place 1 tablet (0.4 mg total) under the tongue every 5 (five) minutes as needed for chest pain. Do not exceed 3 doses within 15 min 25 tablet 3   No current facility-administered medications for this visit.     No Known Allergies  Social History   Social History  . Marital status: Married    Spouse name: N/A  . Number of children: 3  . Years of education: N/A   Occupational History  . [employer]   Social History Main Topics  . Smoking status: Current Every Day Smoker    Packs/day: 0.75    Years: 58.00    Types: Cigarettes  . Smokeless tobacco: Former Systems developer    Types: Chew    Quit date: 07/04/2012     Comment: started smoking at age 51.  Marland Kitchen Alcohol use No  . Drug use: No  . Sexual activity: Not on file   Other Topics Concern  . Not on file   Social History Narrative  . No narrative on file     Review of Systems: General: negative for chills, fever, night sweats or weight changes.  Cardiovascular: negative for chest pain, dyspnea on exertion, edema, orthopnea, palpitations, paroxysmal nocturnal dyspnea or shortness of breath Dermatological: negative for rash Respiratory: negative for cough or wheezing Urologic: negative for hematuria  Abdominal: negative for nausea, vomiting, diarrhea, bright red blood per rectum, melena, or hematemesis Neurologic: negative for visual changes, syncope, or dizziness All other systems reviewed and are otherwise negative except as noted above.    Blood pressure (!) 142/84, pulse 62, height 5' 11.5" (1.816 m), weight 203 lb 12.8 oz (92.4 kg), SpO2 98 %.  General appearance: alert, cooperative and no distress Neck: no carotid bruit and no JVD Lungs: clear to auscultation bilaterally Heart: regular rate and rhythm, S1, S2 normal, no murmur, click, rub or  gallop Extremities: no LEE Pulses: 2+ and symmetric Skin: warm and dry Neurologic: Grossly normal  EKG not performed  ASSESSMENT AND PLAN:   1. Exertional Angina/ Abnormal NST: Patient with known history of CAD and new development of exertional chest pain and dyspnea that is stable. Symptoms are immediately relieved with rest. He denies any symptoms at rest. Recent nuclear stress test is consistent with ischemia with defects in the basal inferior, mid inferior and apical inferior region. Left ventricular ejection fraction is normal by recent echocardiogram at 50-55%. We discussed indication for definitive left heart catheterization. The patient is in agreement to proceed. His last dose of Xarelto was last night. I discussed with our office pharmacist the timing of Xarelto and invasive cardiac catheterization. Pharmacist has recommended holding for least 24 hours. We discussed potential associated risks including risk of death, MI, stroke, loss of limb, vascular injury, bleeding injury, renal injury as well as allergic reaction to contrast. The patient understands these risks and agrees to proceed. We'll arrange definitive left heart catheterization with Dr. Ellyn Hack at [facility] tomorrow at 12 noon. Recent laboratory work was obtained at his last office visit and renal function was normal. We will obtain a BMP and CBC tomorrow morning prior to his procedure. Patient was instructed to hold his evening dose of Xarelto tonight. He verbalized understanding. Rx for SL NTG sent to pharmacy.   2. PAF: Physical exam reveals RRR. He has concerns given high cost of Multaq. I checked with pharmacy. This is not yet generic. We will give patient samples. Will defer to Dr. Radford Pax switch to amiodarone. Hold Xarelto tonight, as outlined above for planned LHC.   3. HLD: continue Crestor.     Lyda Jester PA-C 08/21/2015 11:31 AM

## 2015-08-21 NOTE — Telephone Encounter (Signed)
Patient was seen in [place] today. ECHO ordered to be scheduled in 1 year.

## 2015-08-21 NOTE — Telephone Encounter (Signed)
-----   Message from Sueanne Margarita, MD sent at 08/20/2015  4:45 PM EDT ----- Low normal LVF with EF 50-55% , mildly dilated LA and mild pulmonary HTN - please repeat echo in 1 year to assess PA pressures

## 2015-08-22 ENCOUNTER — Other Ambulatory Visit: Payer: Self-pay

## 2015-08-22 ENCOUNTER — Encounter (HOSPITAL_COMMUNITY)
Admission: RE | Disposition: A | Discharge status: Home / Self Care | Payer: Self-pay | Source: Ambulatory Visit | Attending: Cardiology

## 2015-08-22 ENCOUNTER — Ambulatory Visit (HOSPITAL_COMMUNITY)
Admission: RE | Admit: 2015-08-22 | Discharge: 2015-08-23 | Disposition: A | Discharge status: Home / Self Care | Payer: Medicare Other | Source: Ambulatory Visit | Attending: Cardiology | Admitting: Cardiology

## 2015-08-22 DIAGNOSIS — Z9861 Coronary angioplasty status: Secondary | ICD-10-CM

## 2015-08-22 DIAGNOSIS — Z7902 Long term (current) use of antithrombotics/antiplatelets: Secondary | ICD-10-CM | POA: Insufficient documentation

## 2015-08-22 DIAGNOSIS — I25118 Atherosclerotic heart disease of native coronary artery with other forms of angina pectoris: Secondary | ICD-10-CM | POA: Insufficient documentation

## 2015-08-22 DIAGNOSIS — F17210 Nicotine dependence, cigarettes, uncomplicated: Secondary | ICD-10-CM | POA: Diagnosis not present

## 2015-08-22 DIAGNOSIS — I25119 Atherosclerotic heart disease of native coronary artery with unspecified angina pectoris: Secondary | ICD-10-CM

## 2015-08-22 DIAGNOSIS — Z86718 Personal history of other venous thrombosis and embolism: Secondary | ICD-10-CM | POA: Diagnosis not present

## 2015-08-22 DIAGNOSIS — J449 Chronic obstructive pulmonary disease, unspecified: Secondary | ICD-10-CM | POA: Diagnosis not present

## 2015-08-22 DIAGNOSIS — I480 Paroxysmal atrial fibrillation: Secondary | ICD-10-CM | POA: Diagnosis present

## 2015-08-22 DIAGNOSIS — G4733 Obstructive sleep apnea (adult) (pediatric): Secondary | ICD-10-CM | POA: Diagnosis present

## 2015-08-22 DIAGNOSIS — I2582 Chronic total occlusion of coronary artery: Secondary | ICD-10-CM | POA: Diagnosis not present

## 2015-08-22 DIAGNOSIS — R9439 Abnormal result of other cardiovascular function study: Secondary | ICD-10-CM | POA: Diagnosis present

## 2015-08-22 DIAGNOSIS — T82855A Stenosis of coronary artery stent, initial encounter: Secondary | ICD-10-CM | POA: Insufficient documentation

## 2015-08-22 DIAGNOSIS — Z7901 Long term (current) use of anticoagulants: Secondary | ICD-10-CM | POA: Diagnosis not present

## 2015-08-22 DIAGNOSIS — I10 Essential (primary) hypertension: Secondary | ICD-10-CM | POA: Diagnosis not present

## 2015-08-22 DIAGNOSIS — Z7982 Long term (current) use of aspirin: Secondary | ICD-10-CM | POA: Insufficient documentation

## 2015-08-22 DIAGNOSIS — I2510 Atherosclerotic heart disease of native coronary artery without angina pectoris: Secondary | ICD-10-CM

## 2015-08-22 DIAGNOSIS — Y712 Prosthetic and other implants, materials and accessory cardiovascular devices associated with adverse incidents: Secondary | ICD-10-CM | POA: Diagnosis not present

## 2015-08-22 DIAGNOSIS — I209 Angina pectoris, unspecified: Secondary | ICD-10-CM | POA: Diagnosis present

## 2015-08-22 DIAGNOSIS — E785 Hyperlipidemia, unspecified: Secondary | ICD-10-CM | POA: Diagnosis present

## 2015-08-22 HISTORY — PX: CARDIAC CATHETERIZATION: SHX172

## 2015-08-22 LAB — PROTIME-INR
INR: 1.27
PROTHROMBIN TIME: 16 s — AB (ref 11.4–15.2)

## 2015-08-22 LAB — TROPONIN I: Troponin I: <0.03 ng/mL (ref <0.03)

## 2015-08-22 SURGERY — LEFT HEART CATH AND CORONARY ANGIOGRAPHY

## 2015-08-22 MED ORDER — SODIUM CHLORIDE 0.9 % IV SOLN: 250.0000 mL | INTRAVENOUS | Status: DC | PRN

## 2015-08-22 MED ORDER — SODIUM CHLORIDE 0.9% FLUSH: 3.0000 mL | Freq: Two times a day (BID) | INTRAVENOUS | Status: DC

## 2015-08-22 MED ORDER — CLOPIDOGREL BISULFATE 300 MG PO TABS
ORAL_TABLET | ORAL | Status: AC
  Filled 2015-08-22: qty 1

## 2015-08-22 MED ORDER — MIDAZOLAM HCL 2 MG/2ML IJ SOLN
INTRAMUSCULAR | Status: AC
  Filled 2015-08-22: qty 2

## 2015-08-22 MED ORDER — FENTANYL CITRATE (PF) 100 MCG/2ML IJ SOLN
INTRAMUSCULAR | Status: AC
  Filled 2015-08-22: qty 2

## 2015-08-22 MED ORDER — SODIUM CHLORIDE 0.9% FLUSH: 3.0000 mL | INTRAVENOUS | Status: DC | PRN

## 2015-08-22 MED ORDER — SODIUM CHLORIDE 0.9 % IV SOLN
INTRAVENOUS | Status: DC | PRN
  Administered 2015-08-22: 1.75 mg/kg/h via INTRAVENOUS

## 2015-08-22 MED ORDER — RIVAROXABAN 20 MG PO TABS
20.0000 mg | ORAL_TABLET | Freq: Every day | ORAL | Status: DC
  Filled 2015-08-22: qty 1

## 2015-08-22 MED ORDER — ACETAMINOPHEN 325 MG PO TABS: 650.0000 mg | ORAL_TABLET | ORAL | Status: DC | PRN

## 2015-08-22 MED ORDER — MORPHINE SULFATE (PF) 2 MG/ML IV SOLN: 1.0000 mg | INTRAVENOUS | Status: DC | PRN

## 2015-08-22 MED ORDER — LIDOCAINE HCL (PF) 1 % IJ SOLN
INTRAMUSCULAR | Status: AC
  Filled 2015-08-22: qty 30

## 2015-08-22 MED ORDER — HEPARIN (PORCINE) IN NACL 2-0.9 UNIT/ML-% IJ SOLN
INTRAMUSCULAR | Status: DC | PRN
  Administered 2015-08-22: 1000 mL

## 2015-08-22 MED ORDER — IOPAMIDOL (ISOVUE-370) INJECTION 76%
INTRAVENOUS | Status: AC
  Filled 2015-08-22: qty 125

## 2015-08-22 MED ORDER — ASPIRIN 81 MG PO CHEW: 81.0000 mg | CHEWABLE_TABLET | ORAL | Status: DC

## 2015-08-22 MED ORDER — HEPARIN SODIUM (PORCINE) 1000 UNIT/ML IJ SOLN
INTRAMUSCULAR | Status: DC | PRN
  Administered 2015-08-22: 4500 [IU] via INTRAVENOUS

## 2015-08-22 MED ORDER — IOPAMIDOL (ISOVUE-370) INJECTION 76%
INTRAVENOUS | Status: AC
  Filled 2015-08-22: qty 100

## 2015-08-22 MED ORDER — BIVALIRUDIN 250 MG IV SOLR
INTRAVENOUS | Status: AC
Start: 2015-08-22 — End: 2015-08-22
  Filled 2015-08-22: qty 250

## 2015-08-22 MED ORDER — VERAPAMIL HCL 2.5 MG/ML IV SOLN
INTRAVENOUS | Status: DC | PRN
  Administered 2015-08-22: 10 mL via INTRA_ARTERIAL

## 2015-08-22 MED ORDER — ONDANSETRON HCL 4 MG/2ML IJ SOLN: 4.0000 mg | Freq: Four times a day (QID) | INTRAMUSCULAR | Status: DC | PRN

## 2015-08-22 MED ORDER — LIDOCAINE HCL (PF) 1 % IJ SOLN
INTRAMUSCULAR | Status: DC | PRN
  Administered 2015-08-22: 3 mL

## 2015-08-22 MED ORDER — NITROGLYCERIN 1 MG/10 ML FOR IR/CATH LAB
INTRA_ARTERIAL | Status: AC
  Filled 2015-08-22: qty 10

## 2015-08-22 MED ORDER — SODIUM CHLORIDE 0.9 % WEIGHT BASED INFUSION
1.0000 mL/kg/h | INTRAVENOUS | Status: DC
  Administered 2015-08-22: 1 mL/kg/h via INTRAVENOUS

## 2015-08-22 MED ORDER — PANTOPRAZOLE SODIUM 40 MG PO TBEC
40.0000 mg | DELAYED_RELEASE_TABLET | Freq: Every day | ORAL | Status: DC
  Administered 2015-08-22: 40 mg via ORAL
  Filled 2015-08-22 (×2): qty 1

## 2015-08-22 MED ORDER — NITROGLYCERIN 0.4 MG SL SUBL: 0.4000 mg | SUBLINGUAL_TABLET | SUBLINGUAL | Status: DC | PRN

## 2015-08-22 MED ORDER — HEPARIN SODIUM (PORCINE) 1000 UNIT/ML IJ SOLN
INTRAMUSCULAR | Status: AC
  Filled 2015-08-22: qty 1

## 2015-08-22 MED ORDER — HEPARIN (PORCINE) IN NACL 2-0.9 UNIT/ML-% IJ SOLN
INTRAMUSCULAR | Status: AC
  Filled 2015-08-22: qty 1000

## 2015-08-22 MED ORDER — CLOPIDOGREL BISULFATE 75 MG PO TABS
75.0000 mg | ORAL_TABLET | Freq: Every day | ORAL | Status: DC
  Administered 2015-08-23: 75 mg via ORAL
  Filled 2015-08-22: qty 1

## 2015-08-22 MED ORDER — ROSUVASTATIN CALCIUM 10 MG PO TABS
5.0000 mg | ORAL_TABLET | Freq: Every day | ORAL | Status: DC
  Administered 2015-08-22: 5 mg via ORAL
  Filled 2015-08-22 (×2): qty 1

## 2015-08-22 MED ORDER — TRAZODONE HCL 50 MG PO TABS
200.0000 mg | ORAL_TABLET | Freq: Every day | ORAL | Status: DC
  Administered 2015-08-22: 21:00:00 200 mg via ORAL
  Filled 2015-08-22: qty 4

## 2015-08-22 MED ORDER — ASPIRIN EC 81 MG PO TBEC
81.0000 mg | DELAYED_RELEASE_TABLET | Freq: Every day | ORAL | Status: DC
  Filled 2015-08-22: qty 1

## 2015-08-22 MED ORDER — DRONEDARONE HCL 400 MG PO TABS
400.0000 mg | ORAL_TABLET | Freq: Two times a day (BID) | ORAL | Status: DC
  Administered 2015-08-22 – 2015-08-23 (×2): 400 mg via ORAL
  Filled 2015-08-22 (×3): qty 1

## 2015-08-22 MED ORDER — CLOPIDOGREL BISULFATE 300 MG PO TABS
ORAL_TABLET | ORAL | Status: DC | PRN
  Administered 2015-08-22: 600 mg via ORAL

## 2015-08-22 MED ORDER — VERAPAMIL HCL 2.5 MG/ML IV SOLN
INTRAVENOUS | Status: AC
  Filled 2015-08-22: qty 2

## 2015-08-22 MED ORDER — SODIUM CHLORIDE 0.9 % WEIGHT BASED INFUSION
1.0000 mL/kg/h | INTRAVENOUS | Status: AC
  Administered 2015-08-22: 1 mL/kg/h via INTRAVENOUS

## 2015-08-22 MED ORDER — NITROGLYCERIN 1 MG/10 ML FOR IR/CATH LAB
INTRA_ARTERIAL | Status: DC | PRN
  Administered 2015-08-22 (×2): 200 ug via INTRACORONARY

## 2015-08-22 MED ORDER — BIVALIRUDIN BOLUS VIA INFUSION - CUPID
INTRAVENOUS | Status: DC | PRN
  Administered 2015-08-22: 69.075 mg via INTRAVENOUS

## 2015-08-22 MED ORDER — SODIUM CHLORIDE 0.9 % WEIGHT BASED INFUSION
3.0000 mL/kg/h | INTRAVENOUS | Status: DC
  Administered 2015-08-22: 3 mL/kg/h via INTRAVENOUS

## 2015-08-22 MED ORDER — IOPAMIDOL (ISOVUE-370) INJECTION 76%
INTRAVENOUS | Status: DC | PRN
  Administered 2015-08-22: 180 mL via INTRA_ARTERIAL

## 2015-08-22 MED ORDER — MIDAZOLAM HCL 2 MG/2ML IJ SOLN
INTRAMUSCULAR | Status: DC | PRN
Start: 2015-08-22 — End: 2015-08-22
  Administered 2015-08-22 (×2): 2 mg via INTRAVENOUS

## 2015-08-22 MED ORDER — FENTANYL CITRATE (PF) 100 MCG/2ML IJ SOLN
INTRAMUSCULAR | Status: DC | PRN
  Administered 2015-08-22 (×2): 50 ug via INTRAVENOUS

## 2015-08-22 SURGICAL SUPPLY — 19 items
BALLN EMERGE MR 2.5X12 (BALLOONS) ×3
BALLN ~~LOC~~ EMERGE MR 3.25X15 (BALLOONS) ×3
BALLN ~~LOC~~ EUPHORA RX 2.5X12 (BALLOONS) ×3
BALLOON EMERGE MR 2.5X12 (BALLOONS) IMPLANT
BALLOON ~~LOC~~ EMERGE MR 3.25X15 (BALLOONS) IMPLANT
BALLOON ~~LOC~~ EUPHORA RX 2.5X12 (BALLOONS) IMPLANT
BIOFREEDOM 3.0X18 (Permanent Stent) ×2 IMPLANT
CATH OPTITORQUE TIG 4.0 5F (CATHETERS) ×2 IMPLANT
CATH VISTA GUIDE 6FR XBLAD3.5 (CATHETERS) ×2 IMPLANT
DEVICE RAD COMP TR BAND LRG (VASCULAR PRODUCTS) ×2 IMPLANT
GLIDESHEATH SLEND A-KIT 6F 22G (SHEATH) ×2 IMPLANT
KIT ENCORE 26 ADVANTAGE (KITS) ×2 IMPLANT
KIT HEART LEFT (KITS) ×3 IMPLANT
PACK CARDIAC CATHETERIZATION (CUSTOM PROCEDURE TRAY) ×3 IMPLANT
TRANSDUCER W/STOPCOCK (MISCELLANEOUS) ×3 IMPLANT
TUBING CIL FLEX 10 FLL-RA (TUBING) ×3 IMPLANT
WIRE HI TORQ BMW 190CM (WIRE) ×2 IMPLANT
WIRE HI TORQ VERSACORE-J 145CM (WIRE) ×2 IMPLANT
WIRE SAFE-T 1.5MM-J .035X260CM (WIRE) ×2 IMPLANT

## 2015-08-22 NOTE — H&P (View-Only) (Signed)
08/21/2015 Travis [name]   [birth year]  [id number]  Primary Physician FRIED, Jaymes Graff, MD Primary Cardiologist: Dr. Radford Pax   Reason for Visit/CC: Exertional Angina; Abnormal NST  HPI:  Travis [name] is a 75 y.o. male with a history of OSA intolerant to CPAP, dyslipidemia, PAF, HTN, dyslipidemia and CAD s/p PCI of the RCA 1994 with repeat cath 1999 with occluded RCA with left to right collaterals, PCI of LAD 2007 and PCI of LAD 2008 for instent thrombosis. He continues to smoke 1ppd. He also has paroxysmal atrial fibrillation on antiarrhythmic drug therapy with Multaq. He is on Xarelto for anticoagulation. He also has a history of hyperlipidemia that is treated with Crestor.  He was recently seen by Dr. Radford Pax on 08/12/2015 and complained of new dyspnea on exertion/decreased exercise tolerance. Dr. Radford Pax ordered for him to undergo a nuclear stress test and a 2-D echocardiogram to further assess. Stress test was abnormal with a medium defect of moderate severity present in the basal inferior, mid inferior and apical inferior location. Findings consistent with ischemia. Echocardiogram revealed normal left systolic function with estimated ejection fraction of 50-55%. Dr. Radford Pax has recommended that the patient undergo definitive left heart catheterization. He presents back to clinic today with his wife to discussed the results/ indication for procedure.   He continues to have stable, exertional angina and dyspnea. Symptoms improve immediately after resting. He denies any resting symptoms. His last dose of Xarelto was last night.     Current Outpatient Prescriptions  Medication Sig Dispense Refill  . aspirin 81 MG tablet Take 81 mg by mouth daily.      Marland Kitchen dronedarone (MULTAQ) 400 MG tablet TAKE 1 TABLET BY MOUTH TWICE A DAY 180 tablet 0  . pantoprazole (PROTONIX) 40 MG tablet Take 40 mg by mouth daily.      . rosuvastatin (CRESTOR) 5 MG tablet Take 5 mg by mouth daily.    . traZODone  (DESYREL) 150 MG tablet Take 150 mg by mouth at bedtime.    Alveda Reasons 20 MG TABS tablet Take 1 tablet by mouth daily.    . nitroGLYCERIN (NITROSTAT) 0.4 MG SL tablet Place 1 tablet (0.4 mg total) under the tongue every 5 (five) minutes as needed for chest pain. Do not exceed 3 doses within 15 min 25 tablet 3   No current facility-administered medications for this visit.     No Known Allergies  Social History   Social History  . Marital status: Married    Spouse name: N/A  . Number of children: 3  . Years of education: N/A   Occupational History  . [employer]   Social History Main Topics  . Smoking status: Current Every Day Smoker    Packs/day: 0.75    Years: 58.00    Types: Cigarettes  . Smokeless tobacco: Former Systems developer    Types: Chew    Quit date: 07/04/2012     Comment: started smoking at age 65.  Marland Kitchen Alcohol use No  . Drug use: No  . Sexual activity: Not on file   Other Topics Concern  . Not on file   Social History Narrative  . No narrative on file     Review of Systems: General: negative for chills, fever, night sweats or weight changes.  Cardiovascular: negative for chest pain, dyspnea on exertion, edema, orthopnea, palpitations, paroxysmal nocturnal dyspnea or shortness of breath Dermatological: negative for rash Respiratory: negative for cough or wheezing Urologic: negative for hematuria  Abdominal: negative for nausea, vomiting, diarrhea, bright red blood per rectum, melena, or hematemesis Neurologic: negative for visual changes, syncope, or dizziness All other systems reviewed and are otherwise negative except as noted above.    Blood pressure (!) 142/84, pulse 62, height 5' 11.5" (1.816 m), weight 203 lb 12.8 oz (92.4 kg), SpO2 98 %.  General appearance: alert, cooperative and no distress Neck: no carotid bruit and no JVD Lungs: clear to auscultation bilaterally Heart: regular rate and rhythm, S1, S2 normal, no murmur, click, rub or  gallop Extremities: no LEE Pulses: 2+ and symmetric Skin: warm and dry Neurologic: Grossly normal  EKG not performed  ASSESSMENT AND PLAN:   1. Exertional Angina/ Abnormal NST: Patient with known history of CAD and new development of exertional chest pain and dyspnea that is stable. Symptoms are immediately relieved with rest. He denies any symptoms at rest. Recent nuclear stress test is consistent with ischemia with defects in the basal inferior, mid inferior and apical inferior region. Left ventricular ejection fraction is normal by recent echocardiogram at 50-55%. We discussed indication for definitive left heart catheterization. The patient is in agreement to proceed. His last dose of Xarelto was last night. I discussed with our office pharmacist the timing of Xarelto and invasive cardiac catheterization. Pharmacist has recommended holding for least 24 hours. We discussed potential associated risks including risk of death, MI, stroke, loss of limb, vascular injury, bleeding injury, renal injury as well as allergic reaction to contrast. The patient understands these risks and agrees to proceed. We'll arrange definitive left heart catheterization with Dr. Ellyn Hack at [facility] tomorrow at 12 noon. Recent laboratory work was obtained at his last office visit and renal function was normal. We will obtain a BMP and CBC tomorrow morning prior to his procedure. Patient was instructed to hold his evening dose of Xarelto tonight. He verbalized understanding. Rx for SL NTG sent to pharmacy.   2. PAF: Physical exam reveals RRR. He has concerns given high cost of Multaq. I checked with pharmacy. This is not yet generic. We will give patient samples. Will defer to Dr. Radford Pax switch to amiodarone. Hold Xarelto tonight, as outlined above for planned LHC.   3. HLD: continue Crestor.     Lyda Jester PA-C 08/21/2015 11:31 AM

## 2015-08-22 NOTE — Interval H&P Note (Signed)
History and Physical Interval Note:  08/22/2015 3:55 PM  Travis [name]  has presented today for surgery, with the diagnosis of abnormal stress test - angina - cad. Principal Problem:   Angina, class III (HCC) Active Problems:   CAD S/P percutaneous coronary angioplasty   Abnormal nuclear stress test - INTERMEDIATE RISK: medium defect of moderate severity present in the basal inferior, mid inferior and apical inferior location   PAF (paroxysmal atrial fibrillation) (HCC)   Dyslipidemia   OSA (obstructive sleep apnea)   Nicotine addiction   Essential hypertension     The various methods of treatment have been discussed with the patient and family. After consideration of risks, benefits and other options for treatment, the patient has consented to  Procedure(s): Left Heart Cath and Coronary Angiography (N/A)  Possible Percutaneous Coronary Intervention (PCI)   as a surgical intervention .    The patient's history has been reviewed, patient examined, no change in status, stable for surgery.  I have reviewed the patient's chart and labs.  Questions were answered to the patient's satisfaction.    Cath Lab Visit (complete for each Cath Lab visit)  Clinical Evaluation Leading to the Procedure:   ACS: No.  Non-ACS:    Anginal Classification: CCS III  Anti-ischemic medical therapy: No Therapy  Non-Invasive Test Results: Intermediate-risk stress test findings: cardiac mortality 1-3%/year  Prior CABG: No previous CABG  Ischemic Symptoms? CCS III (Marked limitation of ordinary activity) Anti-ischemic Medical Therapy? No Therapy Non-invasive Test Results? Intermediate-risk stress test findings: cardiac mortality 1-3%/year Prior CABG? No Previous CABG   Patient Information:   1-2V CAD, no prox LAD  U (6)  Indication: 16; Score: 6   Patient Information:   CTO of 1 vessel, no other CAD  U (6)  Indication: 26; Score: 6   Patient Information:   1V CAD with prox LAD  A  (7)  Indication: 32; Score: 7   Patient Information:   2V-CAD with prox LAD  A (8)  Indication: 38; Score: 8   Patient Information:   3V-CAD without LMCA  A (8)  Indication: 44; Score: 8   Patient Information:   3V-CAD without LMCA With Abnormal LV systolic function  A (9)  Indication: 48; Score: 9   Patient Information:   LMCA-CAD  A (9)  Indication: 49; Score: 9   Patient Information:   2V-CAD with prox LAD PCI  A (7)  Indication: 62; Score: 7   Patient Information:   2V-CAD with prox LAD CABG  A (8)  Indication: 62; Score: 8   Patient Information:   3V-CAD without LMCA With Low CAD burden(i.e., 3 focal stenoses, low SYNTAX score) PCI  A (7)  Indication: 63; Score: 7   Patient Information:   3V-CAD without LMCA With Low CAD burden(i.e., 3 focal stenoses, low SYNTAX score) CABG  A (9)  Indication: 63; Score: 9   Patient Information:   3V-CAD without LMCA E06c - Intermediate-high CAD burden (i.e., multiple diffuse lesions, presence of CTO, or high SYNTAX score) PCI  U (4)  Indication: 64; Score: 4   Patient Information:   3V-CAD without LMCA E06c - Intermediate-high CAD burden (i.e., multiple diffuse lesions, presence of CTO, or high SYNTAX score) CABG  A (9)  Indication: 64; Score: 9   Patient Information:   LMCA-CAD With Isolated LMCA stenosis  PCI  U (6)  Indication: 65; Score: 6   Patient Information:   LMCA-CAD With Isolated LMCA stenosis  CABG  A (  9)  Indication: 65; Score: 9   Patient Information:   LMCA-CAD Additional CAD, low CAD burden (i.e., 1- to 2-vessel additional involvement, low SYNTAX score) PCI  U (5)  Indication: 66; Score: 5   Patient Information:   LMCA-CAD Additional CAD, low CAD burden (i.e., 1- to 2-vessel additional involvement, low SYNTAX score) CABG  A (9)  Indication: 66; Score: 9   Patient Information:   LMCA-CAD Additional CAD, intermediate-high CAD burden (i.e.,  3-vessel involvement, presence of CTO, or high SYNTAX score) PCI  I (3)  Indication: 67; Score: 3   Patient Information:   LMCA-CAD Additional CAD, intermediate-high CAD burden (i.e., 3-vessel involvement, presence of CTO, or high SYNTAX score) CABG  A (9)  Indication: 67; Score: 9    Glenetta Hew

## 2015-08-23 ENCOUNTER — Encounter (HOSPITAL_COMMUNITY): Payer: Self-pay

## 2015-08-23 ENCOUNTER — Other Ambulatory Visit: Payer: Self-pay | Admitting: Cardiology

## 2015-08-23 DIAGNOSIS — I209 Angina pectoris, unspecified: Secondary | ICD-10-CM

## 2015-08-23 DIAGNOSIS — I2510 Atherosclerotic heart disease of native coronary artery without angina pectoris: Secondary | ICD-10-CM | POA: Diagnosis not present

## 2015-08-23 DIAGNOSIS — I480 Paroxysmal atrial fibrillation: Secondary | ICD-10-CM

## 2015-08-23 DIAGNOSIS — I2582 Chronic total occlusion of coronary artery: Secondary | ICD-10-CM | POA: Diagnosis not present

## 2015-08-23 DIAGNOSIS — T82855A Stenosis of coronary artery stent, initial encounter: Secondary | ICD-10-CM | POA: Diagnosis not present

## 2015-08-23 DIAGNOSIS — F17210 Nicotine dependence, cigarettes, uncomplicated: Secondary | ICD-10-CM | POA: Diagnosis not present

## 2015-08-23 DIAGNOSIS — I10 Essential (primary) hypertension: Secondary | ICD-10-CM

## 2015-08-23 DIAGNOSIS — Z9861 Coronary angioplasty status: Secondary | ICD-10-CM

## 2015-08-23 DIAGNOSIS — G4733 Obstructive sleep apnea (adult) (pediatric): Secondary | ICD-10-CM

## 2015-08-23 DIAGNOSIS — I25118 Atherosclerotic heart disease of native coronary artery with other forms of angina pectoris: Secondary | ICD-10-CM | POA: Diagnosis not present

## 2015-08-23 DIAGNOSIS — Z7901 Long term (current) use of anticoagulants: Secondary | ICD-10-CM

## 2015-08-23 DIAGNOSIS — D62 Acute posthemorrhagic anemia: Secondary | ICD-10-CM

## 2015-08-23 LAB — BASIC METABOLIC PANEL
ANION GAP: 3 — AB (ref 5–15)
BUN: 11 mg/dL (ref 6–20)
CALCIUM: 8.4 mg/dL — AB (ref 8.9–10.3)
CO2: 28 mmol/L (ref 22–32)
Chloride: 108 mmol/L (ref 101–111)
Creatinine, Ser: 1.14 mg/dL (ref 0.61–1.24)
GLUCOSE: 98 mg/dL (ref 65–99)
POTASSIUM: 4.2 mmol/L (ref 3.5–5.1)
SODIUM: 139 mmol/L (ref 135–145)

## 2015-08-23 LAB — CBC
HEMATOCRIT: 32.4 % — AB (ref 39.0–52.0)
HEMOGLOBIN: 9.5 g/dL — AB (ref 13.0–17.0)
MCH: 23.6 pg — ABNORMAL LOW (ref 26.0–34.0)
MCHC: 29.3 g/dL — ABNORMAL LOW (ref 30.0–36.0)
MCV: 80.4 fL (ref 78.0–100.0)
Platelets: 170 10*3/uL (ref 150–400)
RBC: 4.03 MIL/uL — AB (ref 4.22–5.81)
RDW: 15.1 % (ref 11.5–15.5)
WBC: 9.3 10*3/uL (ref 4.0–10.5)

## 2015-08-23 LAB — TROPONIN I: TROPONIN I: 0.08 ng/mL — AB (ref <0.03)

## 2015-08-23 MED ORDER — CLOPIDOGREL BISULFATE 75 MG PO TABS: 75.0000 mg | ORAL_TABLET | Freq: Every day | ORAL | 3 refills | Status: DC

## 2015-08-23 MED ORDER — NITROGLYCERIN 0.4 MG SL SUBL: 0.4000 mg | SUBLINGUAL_TABLET | SUBLINGUAL | 3 refills | Status: DC | PRN

## 2015-08-23 MED ORDER — ACETAMINOPHEN 325 MG PO TABS: 650.0000 mg | ORAL_TABLET | ORAL | Status: DC | PRN

## 2015-08-23 MED ORDER — ASPIRIN 81 MG PO TABS: 81.0000 mg | ORAL_TABLET | Freq: Every day | ORAL | Status: AC

## 2015-08-23 MED ORDER — ANGIOPLASTY BOOK
Freq: Once | Status: AC
  Administered 2015-08-23: 05:00:00
  Filled 2015-08-23: qty 1

## 2015-08-23 NOTE — Research (Signed)
LEADERS FREE II Informed Consent   Subject Name: Travis [name]  Subject met inclusion and exclusion criteria.  The informed consent form, study requirements and expectations were reviewed with the subject and questions and concerns were addressed prior to the signing of the consent form.  The subject verbalized understanding of the trail requirements.  The subject agreed to participate in the LEADERS FREE II trial and signed the informed consent.  The informed consent was obtained prior to performance of any protocol-specific procedures for the subject.  A copy of the signed informed consent was given to the subject and a copy was placed in the subject's medical record.  Berneda Rose 08/23/2015, 8:16 AM

## 2015-08-23 NOTE — Progress Notes (Signed)
Patient has already walked several times this morning without angina or difficulty.  Education completed re: angina symptoms, NTG usage, site care, signs of infection, when to call MD, when to call 911, activity progression, exercise progression, and smoking cessation.  Spouse smokes as well, I spent a lengthy amount of time discussing cessation tactics, gave both a Better Quit cigarette, given Quit Now hotline contact information.  Patient and wife are reluctant with smoking cessation in the past, hopefully they will reconsider at this point.  Discussed anti-platelet therapy which patient has been on for quite some time.   [id number]

## 2015-08-23 NOTE — Discharge Summary (Signed)
Patient ID: Travis [name],  MRN: [id number], DOB/AGE: 05/06/1940 75 y.o.  Admit date: 08/22/2015 Discharge date: 08/23/2015  Primary Care Provider: Beatris Si Primary Cardiologist: Dr Radford Pax  Discharge Diagnoses Principal Problem:   Angina, class III ([place]) Active Problems:   CAD S/P multiple PCIs   Abnormal nuclear stress test 08/13/15    Essential hypertension   Chronic anticoagulation-Xarelto   PAF (paroxysmal atrial fibrillation) (HCC)   Dyslipidemia   OSA (obstructive sleep apnea)   COPD GOLD II still smoking    Nicotine addiction    Procedures: Cath/ LAD PCI with DES for ISR 08/22/15   Hospital Course:  75 y.o. male followed by Dr Radford Pax with a history of OSA intolerant to CPAP, dyslipidemia, PAF on Xarelto, HTN, and CAD -s/p PCI of the RCA 1994 with repeat cath 1999 with occluded RCA with left to right collaterals, PCI of LAD 2007 and PCI of LAD 2008 for instent thrombosis. He was seen by Dr Radford Pax in the office 08/11/15.He reported for the past month he has had exertional fatigue with decreased exercise tolerance.  He has also noticed chest discomfort with ambulation that is new. He is also having DOE which is new for him. Nuclear stress test and echo were ordered. Stress test 08/13/15 was abnormal with a medium defect of moderate severity present in the basal inferior, mid inferior and apical inferior location. Findings consistent with ischemia. Echocardiogram 08/19/15 revealed normal left systolic function with estimated ejection fraction of 50-55%. Dr. Radford Pax recommended that the patient undergo definitive left heart catheterization. This was done 08/22/15 by Dr Ellyn Hack and revealed 80% LAD ISR. This was treated with a DES. The pt has a known CTO of the RCA with L-R collaterals. He tolerated this well and Dr Marlou Porch feels he can be discharged 08/23/15. The plan will be for ASA/Xarelto/ and Plavix for one month, then stop ASA. His Hgb was a little low post PCI but he had no  signs of active bleeding. This will be rechecked next week.    Discharge Vitals:  Blood pressure 124/64, pulse 62, temperature 98.5 F (36.9 C), temperature source Oral, resp. rate 18, height 5' 11.5" (1.816 m), weight 199 lb 15.3 oz (90.7 kg), SpO2 96 %.    Labs: Results for orders placed or performed during the hospital encounter of 08/22/15 (from the past 24 hour(s))  Protime-INR     Status: Abnormal   Collection Time: 08/22/15 10:45 AM  Result Value Ref Range   Prothrombin Time 16.0 (H) 11.4 - 15.2 seconds   INR 1.27   Troponin I     Status: None   Collection Time: 08/22/15  4:26 PM  Result Value Ref Range   Troponin I <0.03 <0.03 ng/mL  Basic metabolic panel     Status: Abnormal   Collection Time: 08/23/15  3:59 AM  Result Value Ref Range   Sodium 139 135 - 145 mmol/L   Potassium 4.2 3.5 - 5.1 mmol/L   Chloride 108 101 - 111 mmol/L   CO2 28 22 - 32 mmol/L   Glucose, Bld 98 65 - 99 mg/dL   BUN 11 6 - 20 mg/dL   Creatinine, Ser 1.14 0.61 - 1.24 mg/dL   Calcium 8.4 (L) 8.9 - 10.3 mg/dL   GFR calc non Af Amer >60 >60 mL/min   GFR calc Af Amer >60 >60 mL/min   Anion gap 3 (L) 5 - 15  CBC     Status: Abnormal   Collection Time:  08/23/15  3:59 AM  Result Value Ref Range   WBC 9.3 4.0 - 10.5 K/uL   RBC 4.03 (L) 4.22 - 5.81 MIL/uL   Hemoglobin 9.5 (L) 13.0 - 17.0 g/dL   HCT 32.4 (L) 39.0 - 52.0 %   MCV 80.4 78.0 - 100.0 fL   MCH 23.6 (L) 26.0 - 34.0 pg   MCHC 29.3 (L) 30.0 - 36.0 g/dL   RDW 15.1 11.5 - 15.5 %   Platelets 170 150 - 400 K/uL  Troponin I (serum)     Status: Abnormal   Collection Time: 08/23/15  3:59 AM  Result Value Ref Range   Troponin I 0.08 (HH) <0.03 ng/mL    Disposition:  Follow-up Information    Fransico Him, MD Follow up on 09/22/2015.   Specialty:  Cardiology Why:  8:15 Contact information: [address] [place] [address] [phone]           Discharge Medications:    Medication List    TAKE these medications     acetaminophen 325 MG tablet Commonly known as:  TYLENOL Take 2 tablets (650 mg total) by mouth every 4 (four) hours as needed for headache or mild pain.   aspirin 81 MG tablet Take 1 tablet (81 mg total) by mouth daily.   clopidogrel 75 MG tablet Commonly known as:  PLAVIX Take 1 tablet (75 mg total) by mouth daily with breakfast.   dronedarone 400 MG tablet Commonly known as:  MULTAQ TAKE 1 TABLET BY MOUTH TWICE A DAY   nitroGLYCERIN 0.4 MG SL tablet Commonly known as:  NITROSTAT Place 1 tablet (0.4 mg total) under the tongue every 5 (five) minutes as needed for chest pain. Do not exceed 3 doses within 15 min   pantoprazole 40 MG tablet Commonly known as:  PROTONIX Take 40 mg by mouth daily.   rosuvastatin 5 MG tablet Commonly known as:  CRESTOR Take 5 mg by mouth daily.   traZODone 100 MG tablet Commonly known as:  DESYREL Take 200 mg by mouth at bedtime.   XARELTO 20 MG Tabs tablet Generic drug:  rivaroxaban Take 1 tablet by mouth daily.        Duration of Discharge Encounter: Greater than 30 minutes including physician time.  Angelena Form PA-C 08/23/2015 9:51 AM     Cardiologist: Radford Pax Subjective:  Feels well post PCI, no CP, no back pain that is new. No syncope or dizziness. No SOB.   Objective:  Vital Signs in the last 24 hours: Temp:  [98 F (36.7 C)-98.5 F (36.9 C)] 98.5 F (36.9 C) (08/19 0728) Pulse Rate:  [0-72] 62 (08/19 0728) Resp:  [7-26] 18 (08/19 0728) BP: (110-190)/(44-105) 124/64 (08/19 0728) SpO2:  [0 %-100 %] 96 % (08/19 0728) Weight:  [199 lb 15.3 oz (90.7 kg)-203 lb (92.1 kg)] 199 lb 15.3 oz (90.7 kg) (08/19 0505)  Intake/Output from previous day: 08/18 0701 - 08/19 0700 In: 618.2 [P.O.:480; I.V.:138.2] Out: 800 [Urine:800]   Physical Exam: General: Well developed, well nourished, in no acute distress. Head:  Normocephalic and atraumatic. Lungs: Clear to auscultation and percussion. Heart: Normal S1 and S2,  brady reg.  No murmur, rubs or gallops.  Abdomen: soft, non-tender, positive bowel sounds. Extremities: No clubbing or cyanosis. No edema. Cath site normal.  Neurologic: Alert and oriented x 3.    Lab Results:  Recent Labs (last 2 labs)    Recent Labs  08/23/15 0359  WBC 9.3  HGB 9.5*  PLT 170      Recent Labs (last 2 labs)    Recent Labs  08/23/15 0359  NA 139  K 4.2  CL 108  CO2 28  GLUCOSE 98  BUN 11  CREATININE 1.14      Recent Labs (last 2 labs)    Recent Labs  08/22/15 1626 08/23/15 0359  TROPONINI <0.03 0.08*     Telemetry: Sinus brady otherwise normal.  Personally viewed.   EKG:  No ST changes NSR Personally viewed.  Cardiac Studies:  Cath Coronary Diagrams   Diagnostic Diagram     Post-Intervention Diagram       Culprit Lesion Prox LAD to Mid LAD lesion, 80 %stenosed. Mid LAD Taxus DES stent, 65 % in-stent re-stenosis.  A BIOFREEDOM 3.0X18 drug eluting stent (Leader's Free Trial Stent) was successfully placed, and overlaps previously placed stent.  Post intervention, there is a 0% residual stenosis.  Ost RCA to Dist RCA lesion, 100 %stenosed. Known CTO with left right collaterals from both the LAD and circumflex. The LAD lesion being significant likely lead to reduced collateral flow and therefore inferior ischemia on the stress test.  There is mild left ventricular systolic dysfunction. The left ventricular ejection fraction is 50-55% by visual estimate with inferoapical hypokinesis  There is trivial (1+) mitral regurgitation.  LV end diastolic pressure is mildly elevated.   Successful PCI of pre-and in-stent restenosis in the mid LAD with a single bio freedom DES stent. Initial deployment was difficult, requiring staged post-dilation with a 2.5 followed by 3.25 mm noncompliant balloon. Final result was post dilation to 3.3 mm. The stent extended from just distal to D1, to the distal third of the previous we placed  overlapping Taxus DES stents.  Plan:  Overnight monitoring post PCI with TR band removal.  Dual independent therapy plus Xarelto for 1 month. Then stop aspirin. Continue Xarelto plus Plavix for minimum of 1 year. Based on 3 overlapping stents, would probably continue Plavix lifelong if possible and no bleeding issues.  Smoking cessation counseling  Risk factor modification with cardiac medications.  Meds: Scheduled Meds: . aspirin EC  81 mg Oral Daily  . clopidogrel  75 mg Oral Q breakfast  . dronedarone  400 mg Oral BID WC  . pantoprazole  40 mg Oral Daily  . rivaroxaban  20 mg Oral Daily  . rosuvastatin  5 mg Oral Daily  . traZODone  200 mg Oral QHS   Continuous Infusions:  PRN Meds:.acetaminophen, morphine injection, nitroGLYCERIN, ondansetron (ZOFRAN) IV  Assessment/Plan:  Principal Problem:   Angina, class III (HCC) Active Problems:   PAF (paroxysmal atrial fibrillation) (HCC)   Essential hypertension   Dyslipidemia   OSA (obstructive sleep apnea)   Nicotine addiction   CAD S/P percutaneous coronary angioplasty   Abnormal nuclear stress test - INTERMEDIATE RISK: medium defect of moderate severity present in the basal inferior, mid inferior and apical inferior location   CAD/Angina/abnormal stress test  - successful PCI to LAD as above  - Triple therapy as above. ASA only for one month.   PAF  - Multaq  - Xarelto  - in NSR  HL  - Crestor  Hgb drop noted in lab. He is asymptomatic with no signs of bleeding. No new back pain. Feels well. Will check again as outpatient.   Mild elevated troponin - expected post procedure.   OK for DC. Discussed with wife and patient. Luke to DC.   Candee Furbish 08/23/2015, 9:11 AM

## 2015-08-23 NOTE — Discharge Instructions (Signed)
Take aspirin Plavix and Xarelto for one month, then stop aspirin. Have CBC checked next week  Coronary Angiogram With Stent, Care After Refer to this sheet in the next few weeks. These instructions provide you with information about caring for yourself after your procedure. Your health care provider may also give you more specific instructions. Your treatment has been planned according to current medical practices, but problems sometimes occur. Call your health care provider if you have any problems or questions after your procedure. WHAT TO EXPECT AFTER THE PROCEDURE  After your procedure, it is typical to have the following:  Bruising at the catheter insertion site that usually fades within 1-2 weeks.  Blood collecting in the tissue (hematoma) that may be painful to the touch. It should usually decrease in size and tenderness within 1-2 weeks. HOME CARE INSTRUCTIONS  Take medicines only as directed by your health care provider. Blood thinners may be prescribed after your procedure to improve blood flow through the stent.  You may shower 24-48 hours after the procedure or as directed by your health care provider. Remove the bandage (dressing) and gently wash the catheter insertion site with plain soap and water. Pat the area dry with a clean towel. Do not rub the site, because this may cause bleeding.  Do not take baths, swim, or use a hot tub until your health care provider approves.  Check your catheter insertion site every day for redness, swelling, or drainage.  Do not apply powder or lotion to the site.  Do not lift over 10 lb (4.5 kg) for 5 days after your procedure or as directed by your health care provider.  Ask your health care provider when it is okay to:  Return to work or school.  Resume usual physical activities or sports.  Resume sexual activity.  Eat a heart-healthy diet. This should include plenty of fresh fruits and vegetables. Meat should be lean cuts. Avoid the  following types of food:  Food that is high in salt.  Canned or highly processed food.  Food that is high in saturated fat or sugar.  Fried food.  Make any other lifestyle changes as recommended by your health care provider. These may include:  Not using any tobacco products, including cigarettes, chewing tobacco, or electronic cigarettes.If you need help quitting, ask your health care provider.  Managing your weight.  Getting regular exercise.  Managing your blood pressure.  Limiting your alcohol intake.  Managing other health problems, such as diabetes.  If you need an MRI after your heart stent has been placed, be sure to tell the health care provider who orders the MRI that you have a heart stent.  Keep all follow-up visits as directed by your health care provider. This is important. SEEK MEDICAL CARE IF:  You have a fever.  You have chills.  You have increased bleeding from the catheter insertion site. Hold pressure on the site. SEEK IMMEDIATE MEDICAL CARE IF:  You develop chest pain or shortness of breath, feel faint, or pass out.  You have unusual pain at the catheter insertion site.  You have redness, warmth, or swelling at the catheter insertion site.  You have drainage (other than a small amount of blood on the dressing) from the catheter insertion site.  The catheter insertion site is bleeding, and the bleeding does not stop after 30 minutes of holding steady pressure on the site.  You develop bleeding from any other place, such as from your rectum. There may be  bright red blood in your urine or stool, or it may appear as black, tarry stool.   This information is not intended to replace advice given to you by your health care provider. Make sure you discuss any questions you have with your health care provider.   Document Released: 07/10/2004 Document Revised: 01/11/2014 Document Reviewed: 05/15/2012 Elsevier Interactive Patient Education [employer].

## 2015-08-23 NOTE — Progress Notes (Addendum)
Cardiologist: Radford Pax Subjective:  Feels well post PCI, no CP, no back pain that is new. No syncope or dizziness. No SOB.   Objective:  Vital Signs in the last 24 hours: Temp:  [98 F (36.7 C)-98.5 F (36.9 C)] 98.5 F (36.9 C) (08/19 0728) Pulse Rate:  [0-72] 62 (08/19 0728) Resp:  [7-26] 18 (08/19 0728) BP: (110-190)/(44-105) 124/64 (08/19 0728) SpO2:  [0 %-100 %] 96 % (08/19 0728) Weight:  [199 lb 15.3 oz (90.7 kg)-203 lb (92.1 kg)] 199 lb 15.3 oz (90.7 kg) (08/19 0505)  Intake/Output from previous day: 08/18 0701 - 08/19 0700 In: 618.2 [P.O.:480; I.V.:138.2] Out: 800 [Urine:800]   Physical Exam: General: Well developed, well nourished, in no acute distress. Head:  Normocephalic and atraumatic. Lungs: Clear to auscultation and percussion. Heart: Normal S1 and S2, brady reg.  No murmur, rubs or gallops.  Abdomen: soft, non-tender, positive bowel sounds. Extremities: No clubbing or cyanosis. No edema. Cath site normal.  Neurologic: Alert and oriented x 3.    Lab Results:  Recent Labs  08/23/15 0359  WBC 9.3  HGB 9.5*  PLT 170    Recent Labs  08/23/15 0359  NA 139  K 4.2  CL 108  CO2 28  GLUCOSE 98  BUN 11  CREATININE 1.14    Recent Labs  08/22/15 1626 08/23/15 0359  TROPONINI <0.03 0.08*   Telemetry: Sinus brady otherwise normal.  Personally viewed.   EKG:  No ST changes NSR Personally viewed.  Cardiac Studies:  Cath Coronary Diagrams   Diagnostic Diagram     Post-Intervention Diagram       Culprit Lesion Prox LAD to Mid LAD lesion, 80 %stenosed. Mid LAD Taxus DES stent, 65 % in-stent re-stenosis.  A BIOFREEDOM 3.0X18 drug eluting stent (Leader's Free Trial Stent) was successfully placed, and overlaps previously placed stent.  Post intervention, there is a 0% residual stenosis.  Ost RCA to Dist RCA lesion, 100 %stenosed. Known CTO with left right collaterals from both the LAD and circumflex. The LAD lesion being significant likely  lead to reduced collateral flow and therefore inferior ischemia on the stress test.  There is mild left ventricular systolic dysfunction. The left ventricular ejection fraction is 50-55% by visual estimate with inferoapical hypokinesis  There is trivial (1+) mitral regurgitation.  LV end diastolic pressure is mildly elevated.    Successful PCI of pre-and in-stent restenosis in the mid LAD with a single bio freedom DES stent. Initial deployment was difficult, requiring staged post-dilation with a 2.5 followed by 3.25 mm noncompliant balloon. Final result was post dilation to 3.3 mm.  The stent extended from just distal to D1, to the distal third of the previous we placed overlapping Taxus DES stents.  Plan:  Overnight monitoring post PCI with TR band removal.  Dual independent therapy plus Xarelto for 1 month. Then stop aspirin. Continue Xarelto plus Plavix for minimum of 1 year. Based on 3 overlapping stents, would probably continue Plavix lifelong if possible and no bleeding issues.  Smoking cessation counseling  Risk factor modification with cardiac medications.  Meds: Scheduled Meds: . aspirin EC  81 mg Oral Daily  . clopidogrel  75 mg Oral Q breakfast  . dronedarone  400 mg Oral BID WC  . pantoprazole  40 mg Oral Daily  . rivaroxaban  20 mg Oral Daily  . rosuvastatin  5 mg Oral Daily  . traZODone  200 mg Oral QHS   Continuous Infusions:  PRN Meds:.acetaminophen, morphine injection, nitroGLYCERIN,  ondansetron (ZOFRAN) IV  Assessment/Plan:  Principal Problem:   Angina, class III (HCC) Active Problems:   PAF (paroxysmal atrial fibrillation) (HCC)   Essential hypertension   Dyslipidemia   OSA (obstructive sleep apnea)   Nicotine addiction   CAD S/P percutaneous coronary angioplasty   Abnormal nuclear stress test - INTERMEDIATE RISK: medium defect of moderate severity present in the basal inferior, mid inferior and apical inferior location   CAD/Angina/abnormal stress  test  - successful PCI to LAD as above  - Triple therapy as above. ASA only for one month.   PAF  - Multaq  - Xarelto  - in NSR  HL  - Crestor  Hgb drop noted in lab. He is asymptomatic with no signs of bleeding. No new back pain. Feels well. Will check again as outpatient.   Mild elevated troponin - expected post procedure.   OK for DC. Discussed with wife and patient. Luke to DC.   Candee Furbish 08/23/2015, 9:11 AM

## 2015-08-25 ENCOUNTER — Encounter (HOSPITAL_COMMUNITY): Payer: Self-pay | Admitting: Cardiology

## 2015-08-25 LAB — POCT ACTIVATED CLOTTING TIME: ACTIVATED CLOTTING TIME: 494 s

## 2015-09-01 ENCOUNTER — Telehealth: Payer: Self-pay | Admitting: Cardiology

## 2015-09-01 ENCOUNTER — Encounter: Payer: Self-pay | Admitting: Physician Assistant

## 2015-09-01 NOTE — Progress Notes (Signed)
Cardiology Office Note    Date:  09/02/2015  ID:  Travis [name], DOB 03-02-1940, MRN [id number] PCP:  Beatris Si  Cardiologist:  Radford Pax  Chief Complaint: discuss med cost  History of Present Illness:  Travis [name] is a 75 y.o. male with history of CAD (s/p PCI of the RCA 1994 with repeat cath 1999 with occluded RCA with left to right collaterals, PCI of LAD 2007, PCI of LAD 2008 for instent thrombosis, PCI 08/2015 with DES to LAD due to ISR with known L-R collaterals to RCA), OSA intolerant to CPAP, dyslipidemia, PAF on Xarelto, HTN, COPD, h/o lung CA, tobacco abuse who presents for f/u to discuss medication issues. He was recently evaluated for fatigue/decreased exercise tolerance with abnl nuc which lead to [facility] 08/22/15 revealing 80% ISR of LAD tx with DES with known CTO of RCA with L-R collaterals, mildly elevated LVEDP, EF 50-55%, trivial MR. It was recommended he be for ASA/Xarelto and Plavix for one month, then stop ASA. Cath note indicates "Based on 3 overlapping stents, would probably continue Plavix lifelong if possible and no bleeding issues." His Hgb was reported to drop to 9.5 post PCI without acute bleeding reported (recent baseline 11, with prior value of 12.8 in 11/2014). QTc on EKG in hospital was 464m. Last LDL 11/2014 was 43. 2D echo 08/19/15: EF 573-22% normal diastolic parameters, mild RAE, PASP 37.  He comes in for follow-up overall feeling better. He has no further cardiac complaints. No CP, SOB. He has done fairly well on Multaq - only rare breakthrough brief palpitations but very infrequent. However, somehow his cost went from $150 to $600 which he finds completely unreasonable. This is his biggest concern today. No syncope. No bleeding. Does not follow BP at home.   Past Medical History:  Diagnosis Date  . AAA family hx 11/20/2014  . CAD (coronary artery disease)    s/p RCA atherectomy 1994, cath 1999 with occluded RCA with left to right collaterals, PCI of  LAD 2007, s/p PCI of LAD for instent thrombosis 2008, PCI 08/2015 with DES to LAD due to ISR with known L-R collaterals to RCA  . Dyslipidemia   . Hyperlipidemia   . Hypertension   . Insomnia   . Lung cancer ([facility]    s/p lobectomy, chemo and radiation therapy  . OSA (obstructive sleep apnea)    intolerant to CPAP  . PAF (paroxysmal atrial fibrillation) ([facility]   . Psoriasis   . Tobacco abuse     Past Surgical History:  Procedure Laterality Date  . APPENDECTOMY    . CARDIAC CATHETERIZATION  1999  . CARDIAC CATHETERIZATION N/A 08/22/2015   Procedure: Left Heart Cath and Coronary Angiography;  Surgeon: DLeonie Man MD;  [facility];  Service: Cardiovascular;  Laterality: N/A;  . CARDIAC CATHETERIZATION N/A 08/22/2015   Procedure: Coronary Stent Intervention;  Surgeon: DLeonie Man MD;  [facility];  Service: Cardiovascular;  Laterality: N/A;  . EXPLORATORY LAPAROTOMY     secondary to trauma  . multiple PCT's to RCA and LAD  2007/2008  . s/p lung lobectomy  2007  . VIDEO BRONCHOSCOPY N/A 08/28/2012   Procedure: VIDEO BRONCHOSCOPY WITHOUT FLUORO;  Surgeon: PElsie Stain MD;  [facility]  Service: Cardiopulmonary;  Laterality: N/A;    Current Medications: Current Outpatient Prescriptions  Medication Sig Dispense Refill  . acetaminophen (TYLENOL) 325 MG tablet Take 2 tablets (650 mg total) by mouth every 4 (four)  hours as needed for headache or mild pain.    Marland Kitchen aspirin 81 MG tablet Take 1 tablet (81 mg total) by mouth daily. 30 tablet   . clopidogrel (PLAVIX) 75 MG tablet Take 1 tablet (75 mg total) by mouth daily with breakfast. 90 tablet 3  . dronedarone (MULTAQ) 400 MG tablet TAKE 1 TABLET BY MOUTH TWICE A DAY 180 tablet 0  . nitroGLYCERIN (NITROSTAT) 0.4 MG SL tablet Place 1 tablet (0.4 mg total) under the tongue every 5 (five) minutes as needed for chest pain. Do not exceed 3 doses within 15 min 25 tablet 3  . pantoprazole  (PROTONIX) 40 MG tablet Take 40 mg by mouth daily.      . rosuvastatin (CRESTOR) 5 MG tablet Take 5 mg by mouth daily.    . traZODone (DESYREL) 100 MG tablet Take 200 mg by mouth at bedtime.     Alveda Reasons 20 MG TABS tablet Take 1 tablet by mouth daily.     No current facility-administered medications for this visit.      Allergies:   Review of patient's allergies indicates no known allergies.   Social History   Social History  . Marital status: Married    Spouse name: N/A  . Number of children: 3  . Years of education: N/A   Occupational History  . [employer]   Social History Main Topics  . Smoking status: Current Every Day Smoker    Packs/day: 0.75    Years: 58.00    Types: Cigarettes  . Smokeless tobacco: Former Systems developer    Types: Chew    Quit date: 07/04/2012     Comment: started smoking at age 67.  Marland Kitchen Alcohol use No  . Drug use: No  . Sexual activity: Not Asked   Other Topics Concern  . None   Social History Narrative  . None     Family History:  The patient's family history includes Breast cancer in his sister; Coronary artery disease in his brother; Diabetes in his sister; Heart attack in his father and mother; Heart disease in his mother and sister; Lymphoma in his brother.   ROS:   Please see the history of present illness.  All other systems are reviewed and otherwise negative.    PHYSICAL EXAM:   VS:  BP (!) 144/84   Pulse 60   Ht '5\' 11"'$  (1.803 m)   Wt 204 lb (92.5 kg)   BMI 28.45 kg/m   BMI: Body mass index is 28.45 kg/m. GEN: Well nourished, well developed obese WM, in no acute distress  HEENT: normocephalic, atraumatic Neck: no JVD, carotid bruits, or masses Cardiac: RRR; no murmurs, rubs, or gallops, no edema  Respiratory:  clear to auscultation bilaterally, normal work of breathing GI: soft, nontender, nondistended, + BS MS: no deformity or atrophy  Skin: warm and dry, no rash, right radial cath site without hematoma or ecchymosis;  good pulse. Neuro:  Alert and Oriented x 3, Strength and sensation are intact, follows commands Psych: euthymic mood, full affect  Wt Readings from Last 3 Encounters:  09/02/15 204 lb (92.5 kg)  08/23/15 199 lb 15.3 oz (90.7 kg)  08/21/15 203 lb 12.8 oz (92.4 kg)      Studies/Labs Reviewed:   EKG:  EKG was ordered today and personally reviewed by me and demonstrates sinus bradycardia 55bpm, QRS 26m, QTc 4466m No acute changes   Recent Labs: 11/20/2014: ALT 11 08/23/2015: BUN 11; Creatinine, Ser 1.14; Hemoglobin 9.5;  Platelets 170; Potassium 4.2; Sodium 139   Lipid Panel    Component Value Date/Time   CHOL 89 (L) 11/20/2014 1015   TRIG 65 11/20/2014 1015   HDL 33 (L) 11/20/2014 1015   CHOLHDL 2.7 11/20/2014 1015   VLDL 13 11/20/2014 1015   LDLCALC 43 11/20/2014 1015    Additional studies/ records that were reviewed today include: Summarized above.    ASSESSMENT & PLAN:   1. Paroxysmal atrial fib - his biggest concern today is cost of Multaq. He is not a candidate for class 1C agents due to CAD. He is not a candidate for Tikosyn due to QTc>457m. He is not a good candidate for sotalol given baseline resting bradycardia. This would also make amiodarone less ideal. Multaq has worked fairly well for him. Samples were given today. I have given him a copay assist card and instructed him and his wife to contact [employer] to discuss possibility of medication assistance as these companies typically try to do what they can for medication continuity. I also asked them to speak with their insurance company as they may be in a donut hole they are unaware of. If he is ultimately unable to decrease the cost of medication, I have asked him to let u[place]ore[place]now at which time I would recommend referral to EP to consider ablation.  2. CAD s/p multiple PCIs - doing well post-PCI. Instructed him that his last day of aspirin is 09/22/15. Not on BB due to resting bradycardia. Will go ahead and check  his fasting labs today to save him a trip to the office (was planned for several weeks). Consider titrating statin based on result. 3. Microcytic anemia - no bleeding reported. F/u CBC. 4. Essential HTN - His BP in the hospital was 120s/70s. Most recent office BPs 140s. I suspect his BP was slightly elevated due to the stress of discussing costly medicines. Instructed him to follow outside the office and call if running >130/80.  Disposition: F/u with Dr. TRadford Paxas planned 10/2015.   Medication Adjustments/Labs and Tests Ordered: Current medicines are reviewed at length with the patient today.  Concerns regarding medicines are outlined above. Medication changes, Labs and Tests ordered today are summarized above and listed in the Patient Instructions accessible in Encounters.   SRaechel AchePA-C  09/02/2015 8:41 AM    [facility] [address] [place]  [id number]: [phone] Fax: [phone]

## 2015-09-01 NOTE — Telephone Encounter (Signed)
New message     Patient calling the office for samples of medication:   1.  What medication and dosage are you requesting samples for? multaq 400 mg  2.  Are you currently out of this medication? yes

## 2015-09-01 NOTE — Telephone Encounter (Signed)
Patient took his last dosage of Multaq this AM and cannot afford more. There are no samples available at the office to offer. Scheduled patient tomorrow at 0800 with Melina Copa for catheterization follow-up and for medication management/changes.

## 2015-09-02 ENCOUNTER — Encounter: Payer: Self-pay | Admitting: Physician Assistant

## 2015-09-02 ENCOUNTER — Other Ambulatory Visit: Payer: Medicare Other

## 2015-09-02 ENCOUNTER — Other Ambulatory Visit: Payer: Self-pay

## 2015-09-02 ENCOUNTER — Ambulatory Visit (INDEPENDENT_AMBULATORY_CARE_PROVIDER_SITE_OTHER): Payer: Medicare Other | Admitting: Physician Assistant

## 2015-09-02 VITALS — BP 144/84 | HR 60 | Ht 71.0 in | Wt 204.0 lb

## 2015-09-02 DIAGNOSIS — Z9861 Coronary angioplasty status: Secondary | ICD-10-CM

## 2015-09-02 DIAGNOSIS — I2510 Atherosclerotic heart disease of native coronary artery without angina pectoris: Secondary | ICD-10-CM | POA: Diagnosis not present

## 2015-09-02 DIAGNOSIS — D509 Iron deficiency anemia, unspecified: Secondary | ICD-10-CM | POA: Diagnosis not present

## 2015-09-02 DIAGNOSIS — I480 Paroxysmal atrial fibrillation: Secondary | ICD-10-CM | POA: Diagnosis not present

## 2015-09-02 DIAGNOSIS — I10 Essential (primary) hypertension: Secondary | ICD-10-CM | POA: Diagnosis not present

## 2015-09-02 LAB — LIPID PANEL
Cholesterol: 72 mg/dL — ABNORMAL LOW (ref 125–200)
HDL: 30 mg/dL — AB (ref >=40)
LDL CALC: 27 mg/dL (ref <130)
TRIGLYCERIDES: 74 mg/dL (ref <150)
Total CHOL/HDL Ratio: 2.4 Ratio (ref <=5.0)
VLDL: 15 mg/dL (ref <30)

## 2015-09-02 LAB — COMPREHENSIVE METABOLIC PANEL
ALT: 9 U/L (ref 9–46)
AST: 12 U/L (ref 10–35)
Albumin: 3.9 g/dL (ref 3.6–5.1)
Alkaline Phosphatase: 62 U/L (ref 40–115)
BUN: 11 mg/dL (ref 7–25)
CHLORIDE: 106 mmol/L (ref 98–110)
CO2: 27 mmol/L (ref 20–31)
Calcium: 8.8 mg/dL (ref 8.6–10.3)
Creat: 1.09 mg/dL (ref 0.70–1.18)
GLUCOSE: 102 mg/dL — AB (ref 65–99)
POTASSIUM: 4.3 mmol/L (ref 3.5–5.3)
Sodium: 141 mmol/L (ref 135–146)
Total Bilirubin: 0.5 mg/dL (ref 0.2–1.2)
Total Protein: 6.2 g/dL (ref 6.1–8.1)

## 2015-09-02 LAB — CBC WITH DIFFERENTIAL/PLATELET
BASOS ABS: 0 {cells}/uL (ref 0–200)
Basophils Relative: 0 %
Eosinophils Absolute: 368 cells/uL (ref 15–500)
Eosinophils Relative: 4 %
HEMATOCRIT: 33.5 % — AB (ref 38.5–50.0)
HEMOGLOBIN: 10.4 g/dL — AB (ref 13.2–17.1)
LYMPHS ABS: 1932 {cells}/uL (ref 850–3900)
Lymphocytes Relative: 21 %
MCH: 23.8 pg — ABNORMAL LOW (ref 27.0–33.0)
MCHC: 31 g/dL — AB (ref 32.0–36.0)
MCV: 76.7 fL — ABNORMAL LOW (ref 80.0–100.0)
MONO ABS: 736 {cells}/uL (ref 200–950)
MPV: 10 fL (ref 7.5–12.5)
Monocytes Relative: 8 %
NEUTROS PCT: 67 %
Neutro Abs: 6164 cells/uL (ref 1500–7800)
Platelets: 216 10*3/uL (ref 140–400)
RBC: 4.37 MIL/uL (ref 4.20–5.80)
RDW: 15.7 % — ABNORMAL HIGH (ref 11.0–15.0)
WBC: 9.2 10*3/uL (ref 3.8–10.8)

## 2015-09-02 LAB — TSH: TSH: 1.27 mIU/L (ref 0.40–4.50)

## 2015-09-02 MED ORDER — CLOPIDOGREL BISULFATE 75 MG PO TABS: 75.0000 mg | ORAL_TABLET | Freq: Every day | ORAL | 3 refills | Status: DC

## 2015-09-02 MED ORDER — ROSUVASTATIN CALCIUM 5 MG PO TABS: 5.0000 mg | ORAL_TABLET | Freq: Every day | ORAL | 3 refills | Status: DC

## 2015-09-02 MED ORDER — PANTOPRAZOLE SODIUM 40 MG PO TBEC: 40.0000 mg | DELAYED_RELEASE_TABLET | Freq: Every day | ORAL | 3 refills | Status: AC

## 2015-09-02 MED ORDER — XARELTO 20 MG PO TABS: 20.0000 mg | ORAL_TABLET | Freq: Every day | ORAL | 3 refills | Status: DC

## 2015-09-02 NOTE — Patient Instructions (Signed)
Lab work today ( CMET,Lipid panel,CBC,TSH )  Lab cancelled for September   Keep appointment with Dr.Turner 10/22/15 at 2:45 pm

## 2015-09-03 NOTE — Addendum Note (Signed)
Addended by: Zebedee Iba on: 09/03/2015 12:46 PM   Modules accepted: Orders

## 2015-09-17 ENCOUNTER — Encounter: Payer: Self-pay | Admitting: *Deleted

## 2015-09-17 DIAGNOSIS — Z006 Encounter for examination for normal comparison and control in clinical research program: Secondary | ICD-10-CM

## 2015-09-17 NOTE — Progress Notes (Signed)
LEADERS FREE II Research study month 1 visit completed. EKG obtained. Patient stated is had not had any angina or other adverse events. Instructed patient to stop ASA on 09/23/15.(as planned at discharge as part of the LEADERS FREE II protocol) Wife and patient verbalized understanding. Research visit window for next 2 study required visits given to patient. Questions encouraged and answered. (Medications reviewed)

## 2015-09-22 ENCOUNTER — Other Ambulatory Visit: Payer: Medicare Other

## 2015-09-24 ENCOUNTER — Ambulatory Visit: Payer: Medicare Other | Admitting: Cardiology

## 2015-09-30 ENCOUNTER — Other Ambulatory Visit: Payer: Self-pay | Admitting: Cardiology

## 2015-09-30 ENCOUNTER — Telehealth: Payer: Self-pay | Admitting: Cardiology

## 2015-09-30 NOTE — Telephone Encounter (Signed)
Confirmed patient is taking Xarelto 20 mg daily and Multaq 400 mg BID. Samples placed at front desk for patient pick up.   Patient requests Dr. Radford Pax to review medications and change them. He does not like relying on samples because they are not always available and he cannot afford the medications - Xarelto was over $150 and Multaq was over $600.   To Dr. Radford Pax.

## 2015-09-30 NOTE — Telephone Encounter (Signed)
New message    Pts wife states that the pt is out of Xarelto and multaq. Pt wife verbalized that she can contact the nurse to discuss. Pt states that the meds are to expensive. Please call early cause pt has to drive from [place], [place] samples are available.  Patient calling the office for samples of medication:   1.  What medication and dosage are you requesting samples for? Xarelto 20 mg and Multaq 400 mg.  2.  Are you currently out of this medication? Yes

## 2015-09-30 NOTE — Telephone Encounter (Signed)
Spoke with patient and his wife and they were informed by the manufacturer that the patient does not qualify for assistance since he has medicare. Per patients wife, he is in the donut hole. He should be able to qualify for patient assistance until he comes out at the first of the year. I have confirmed this to be correct with Jenean Lindau. Patients wife stated that she would be more that happy to fill out the patient assistance forms. She is aware that I will leave the forms along with some samples at the front desk.

## 2015-10-05 ENCOUNTER — Other Ambulatory Visit: Payer: Self-pay | Admitting: Cardiology

## 2015-10-08 NOTE — Addendum Note (Signed)
Addended by: Zebedee Iba on: 10/08/2015 09:56 AM   Modules accepted: Orders

## 2015-10-13 ENCOUNTER — Telehealth: Payer: Self-pay | Admitting: Cardiology

## 2015-10-13 NOTE — Telephone Encounter (Signed)
Follow up   Travis [name] returning call for rn

## 2015-10-13 NOTE — Telephone Encounter (Signed)
Call was transferred into triage.  Patient's wife reports she moved his appointment up to tomorrow because this weekend while they were out of town he told her he is having several symptoms:  These have been happening for over a month:   He gets chest pain walking out to the mailbox, which goes down one of his arms (not sure which, patient would not come to the phone to talk), SOB with exertion, hears his heartbeat in his ears, has no energy.   He is taking Plavix and Xarelto, no aspirin.  I offered appointment his afternoon with APP at 3:15 pm, they have to pick their grandson up and so cannot make it.   Will keep appointment tomorrow.  Advised that pt needs to rest only, no exertional activity and if he develops chest pain at rest or with exertion that does not resolve with rest, that he needs to go to ED for eval, call EMS for transport.  She understands and is appreciative for the information.

## 2015-10-13 NOTE — Telephone Encounter (Signed)
New message    Wife calling     Pt C/O Shortness Of Breath: STAT if SOB developed within the last 24 hours or pt is noticeably SOB on the phone  1. Are you currently SOB (can you hear that pt is SOB on the phone)? Wife verbalized - patient not at home now   2. How long have you been experiencing SOB? Wife verbalized- told wife last week - couple of week   3. Are you SOB when sitting or when up moving around? Wife verbalized - just walking   4. Are you currently experiencing any other symptoms? Wife verbalized - blood sugar is high 196

## 2015-10-13 NOTE — Telephone Encounter (Signed)
LM on home phone for return call, cell did not have voicemail set up.

## 2015-10-13 NOTE — Telephone Encounter (Signed)
Patient's HR and BP have not been checked at home.  No cuff available.

## 2015-10-14 ENCOUNTER — Encounter: Payer: Self-pay | Admitting: Physician Assistant

## 2015-10-14 ENCOUNTER — Ambulatory Visit (INDEPENDENT_AMBULATORY_CARE_PROVIDER_SITE_OTHER): Payer: Medicare Other | Admitting: Physician Assistant

## 2015-10-14 VITALS — BP 138/74 | HR 53 | Ht 71.0 in | Wt 201.0 lb

## 2015-10-14 DIAGNOSIS — I2510 Atherosclerotic heart disease of native coronary artery without angina pectoris: Secondary | ICD-10-CM | POA: Diagnosis not present

## 2015-10-14 DIAGNOSIS — I10 Essential (primary) hypertension: Secondary | ICD-10-CM | POA: Diagnosis not present

## 2015-10-14 DIAGNOSIS — Z9861 Coronary angioplasty status: Secondary | ICD-10-CM | POA: Diagnosis not present

## 2015-10-14 DIAGNOSIS — I480 Paroxysmal atrial fibrillation: Secondary | ICD-10-CM | POA: Diagnosis not present

## 2015-10-14 DIAGNOSIS — D509 Iron deficiency anemia, unspecified: Secondary | ICD-10-CM

## 2015-10-14 DIAGNOSIS — R0609 Other forms of dyspnea: Secondary | ICD-10-CM | POA: Diagnosis not present

## 2015-10-14 DIAGNOSIS — E785 Hyperlipidemia, unspecified: Secondary | ICD-10-CM

## 2015-10-14 MED ORDER — ISOSORBIDE MONONITRATE ER 30 MG PO TB24: 30.0000 mg | ORAL_TABLET | Freq: Every day | ORAL | 3 refills | Status: DC

## 2015-10-14 NOTE — Patient Instructions (Addendum)
Medication Instructions:  Your physician has recommended you make the following change in your medication:  1- START Imdur 30 mg by mouth daily 2- STOP Crestor  Labwork: NONE  Testing/Procedures: NONE  Follow-Up: Your physician wants you to follow-up next week with PA/NP when Dr. Radford Pax is in office.  Your physician recommends that you schedule a follow-up appointment in 1 to 2 days with your primary care doctor or pulmonologist for your COPD.    If you need a refill on your cardiac medications before your next appointment, please call your pharmacy.

## 2015-10-14 NOTE — Progress Notes (Signed)
Cardiology Office Note    Date:  10/14/2015   ID:  Travis [name], DOB Sep 30, 1940, MRN [id number]  PCP:  Beatris Si  Cardiologist:  Dr. Radford Pax Electrophysiologist:   Chief Complaint: shortness of breath  History of Present Illness:   Travis [name] is a 75 y.o. male with history of CAD (s/p PCI of the RCA 1994 with repeat cath 1999 with occluded RCA with left to right collaterals, PCI of LAD 2007, PCI of LAD 2008 for instent thrombosis, PCI 08/2015 with DES to LAD due to ISR with known L-R collaterals to RCA), OSA on CPAP, dyslipidemia, PAF on Xarelto, HTN, COPD, h/o lung CA, ongoing tobacco abuse who presents for evaluation of fatigue and shortness of breath.   He was recently evaluated for fatigue/decreased exercise tolerance with abnl nuc which lead to [facility] 08/22/15 revealing 80% ISR of LAD tx with DES with known CTO of RCA with L-R collaterals, mildly elevated LVEDP, EF 50-55%, trivial MR. It was recommended he be for ASA/Xarelto and Plavix for one month, then stop ASA. Cath note indicates "Based on 3 overlapping stents, would probably continue Plavix lifelong if possible and no bleeding issues." His Hgb was reported to drop to 9.5 post PCI without acute bleeding reported . 2D echo 08/19/15: EF 54-27%, normal diastolic parameters, mild RAE, PASP 37.   The patient was seen by Melina Copa 09/02/15 due to increased cost of Multaq. Advised to apply for medication assistant program. He is not a candidate for class 1C agents due to CAD. He is not a candidate for Tikosyn due to QTc>443m. He is not a good candidate for sotalol given baseline resting bradycardia. This would also make amiodarone less ideal. Instructed him that his last day of aspirin is 09/22/15. Not on BB due to bradycardia.   He added to my schedule for evaluation of shortness of breath and fatigue. No has not felt better or worse since last cath 2  Months ago. Admits to having exertional dyspnea and chest tightness. Similar  to prior cardiac symptoms. He has not tried any SL nitro. No orthopnea, pnd, syncope or le edema. Admits to having leg cramps. He has rescue and maintenance inhaler (not listed on medications regimen)--> after discussing with him and wife seem he is taking incorrectly. Rescue as maintenance. Admits to having cough with thick mucus and sinus congestions.    Past Medical History:  Diagnosis Date  . AAA family hx 11/20/2014  . CAD (coronary artery disease)    s/p RCA atherectomy 1994, cath 1999 with occluded RCA with left to right collaterals, PCI of LAD 2007, s/p PCI of LAD for instent thrombosis 2008, PCI 08/2015 with DES to LAD due to ISR with known L-R collaterals to RCA  . Dyslipidemia   . Hyperlipidemia   . Hypertension   . Insomnia   . Lung cancer ([facility]    s/p lobectomy, chemo and radiation therapy  . OSA (obstructive sleep apnea)    intolerant to CPAP  . 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 Psoriasis   . Tobacco abuse     Past Surgical History:  Procedure Laterality Date  . APPENDECTOMY    . CARDIAC CATHETERIZATION  1999  . CARDIAC CATHETERIZATION N/A 08/22/2015   Procedure: Left Heart Cath and Coronary Angiography;  Surgeon: DLeonie Man MD;  [facility];  Service: Cardiovascular;  Laterality: N/A;  . CARDIAC CATHETERIZATION N/A 08/22/2015   Procedure: Coronary Stent Intervention;  Surgeon: DLeonie Green  Ellyn Hack, MD;  Location: [place] [facility];  Service: Cardiovascular;  Laterality: N/A;  . EXPLORATORY LAPAROTOMY     secondary to trauma  . multiple PCT's to RCA and LAD  2007/2008  . s/p lung lobectomy  2007  . VIDEO BRONCHOSCOPY N/A 08/28/2012   Procedure: VIDEO BRONCHOSCOPY WITHOUT FLUORO;  Surgeon: Elsie Stain, MD;  Location: [place];  Service: Cardiopulmonary;  Laterality: N/A;    Current Medications: Prior to Admission medications   Medication Sig Start Date End Date Taking? Authorizing Provider  acetaminophen (TYLENOL) 325 MG tablet  Take 2 tablets (650 mg total) by mouth every 4 (four) hours as needed for headache or mild pain. 08/23/15   Erlene Quan, PA-C  clopidogrel (PLAVIX) 75 MG tablet Take 1 tablet (75 mg total) by mouth daily with breakfast. 09/02/15   Dayna N Dunn, PA-C  MULTAQ 400 MG tablet TAKE 1 TABLET BY MOUTH TWICE A DAY 30 10/06/15   Sueanne Margarita, MD  nitroGLYCERIN (NITROSTAT) 0.4 MG SL tablet Place 1 tablet (0.4 mg total) under the tongue every 5 (five) minutes as needed for chest pain. Do not exceed 3 doses within 15 min 08/23/15 11/21/15  Doreene Burke Kilroy, PA-C  pantoprazole (PROTONIX) 40 MG tablet Take 1 tablet (40 mg total) by mouth daily. 09/02/15   Dayna N Dunn, PA-C  rosuvastatin (CRESTOR) 5 MG tablet Take 1 tablet (5 mg total) by mouth daily. 09/02/15   Dayna N Dunn, PA-C  traZODone (DESYREL) 100 MG tablet Take 200 mg by mouth at bedtime.     Historical Provider, MD  XARELTO 20 MG TABS tablet Take 1 tablet (20 mg total) by mouth daily. 09/02/15   Dayna N Dunn, PA-C    Allergies:   Review of patient's allergies indicates no known allergies.   Social History   Social History  . Marital status: Married    Spouse name: N/A  . Number of children: 3  . Years of education: N/A   Occupational History  . [employer]   Social History Main Topics  . Smoking status: Current Every Day Smoker    Packs/day: 0.75    Years: 58.00    Types: Cigarettes  . Smokeless tobacco: Former Systems developer    Types: Chew    Quit date: 07/04/2012     Comment: started smoking at age 72.  Marland Kitchen Alcohol use No  . Drug use: No  . Sexual activity: Not Asked   Other Topics Concern  . None   Social History Narrative  . None     Family History:  The patient's family history includes Breast cancer in his sister; Coronary artery disease in his brother; Diabetes in his sister; Heart attack in his father and mother; Heart disease in his mother and sister; Lymphoma in his brother.   ROS:   Please see the history of present  illness.    ROS All other systems reviewed and are negative.   PHYSICAL EXAM:   VS:  BP 138/74   Pulse (!) 53   Ht '5\' 11"'$  (1.803 m)   Wt 201 lb (91.2 kg)   BMI 28.03 kg/m    GEN: Well nourished, well developed, in no acute distress  HEENT: normal  Neck: no JVD, carotid bruits, or masses Cardiac: RRR; no murmurs, rubs, or gallops,no edema  Respiratory:  Diminished breath sound throughout with diffuse wheezing.  GI: soft, nontender, nondistended, + BS MS: no deformity or atrophy  Skin: warm and dry,  no rash Neuro:  Alert and Oriented x 3, Strength and sensation are intact Psych: euthymic mood, full affect  Wt Readings from Last 3 Encounters:  10/14/15 201 lb (91.2 kg)  09/02/15 204 lb (92.5 kg)  08/23/15 199 lb 15.3 oz (90.7 kg)      Studies/Labs Reviewed:   EKG:  EKG is ordered today.  The ekg ordered today demonstrates NSR at rate of 53 bpm  Recent Labs: 09/02/2015: ALT 9; BUN 11; Creat 1.09; Hemoglobin 10.4; Platelets 216; Potassium 4.3; Sodium 141; TSH 1.27   Lipid Panel    Component Value Date/Time   CHOL 72 (L) 09/02/2015 0900   TRIG 74 09/02/2015 0900   HDL 30 (L) 09/02/2015 0900   CHOLHDL 2.4 09/02/2015 0900   VLDL 15 09/02/2015 0900   LDLCALC 27 09/02/2015 0900    Additional studies/ records that were reviewed today include:   Echocardiogram: 08/19/15 LV EF: 50% -   55%  ------------------------------------------------------------------- Indications:      Chest Pain (R07.9).  ------------------------------------------------------------------- History:   PMH:  Hyperlipidemia.  Dyspnea.  Coronary artery disease.  Risk factors:  Hypertension.  ------------------------------------------------------------------- Study Conclusions  - Left ventricle: The cavity size was normal. Wall thickness was   normal. Systolic function was normal. The estimated ejection   fraction was in the range of 50% to 55%. Wall motion was normal;   there were no regional  wall motion abnormalities. Left   ventricular diastolic function parameters were normal. - Right atrium: The atrium was mildly dilated. - Pulmonary arteries: Systolic pressure was mildly increased. PA   peak pressure: 37 mm Hg (S).  Cardiac Catheterization: 08/22/15 Coronary Stent Intervention  Left Heart Cath and Coronary Angiography  Conclusion     Culprit Lesion Prox LAD to Mid LAD lesion, 80 %stenosed. Mid LAD Taxus DES stent, 65 % in-stent re-stenosis.  A BIOFREEDOM 3.0X18 drug eluting stent (Leader's Free Trial Stent) was successfully placed, and overlaps previously placed stent.  Post intervention, there is a 0% residual stenosis.  Ost RCA to Dist RCA lesion, 100 %stenosed. Known CTO with left right collaterals from both the LAD and circumflex. The LAD lesion being significant likely lead to reduced collateral flow and therefore inferior ischemia on the stress test.  There is mild left ventricular systolic dysfunction. The left ventricular ejection fraction is 50-55% by visual estimate with inferoapical hypokinesis  There is trivial (1+) mitral regurgitation.  LV end diastolic pressure is mildly elevated.    Successful PCI of pre-and in-stent restenosis in the mid LAD with a single bio freedom DES stent. Initial deployment was difficult, requiring staged post-dilation with a 2.5 followed by 3.25 mm noncompliant balloon. Final result was post dilation to 3.3 mm.  The stent extended from just distal to D1, to the distal third of the previous we placed overlapping Taxus DES stents.  Plan:  Overnight monitoring post PCI with TR band removal.  Dual independent therapy plus Xarelto for 1 month. Then stop aspirin. Continue Xarelto plus Plavix for minimum of 1 year. Based on 3 overlapping stents, would probably continue Plavix lifelong if possible and no bleeding issues.  Smoking cessation counseling  Risk factor modification with cardiac medications.  Expected discharge  tomorrow if stable.       ASSESSMENT & PLAN:   1. Exertional dyspnea - Seems multifactorial. He has hx of COPD & lung cancer (in remission)-> last chest CT 2015. Followed by Dr. Mertha Finders. He is using his inhaler incorrectly as described above. He has diffuse  wheezing on exam. No s/s of heart failure. Also has s/s of sinus congestion. Concerning for early COPD exacerbation. Spent time on medication eduction especially inhaler. He will call his pharmacy if any further confusion. Advised to f/u with PCP/pulmomary in few days for further evaluation.  2. CAD s/p multiple PCI - Compliant with Plavix and Xarelto. Can not exclude his exertional dyspnea due to CAD. He is at risk of in-stent stenosis. Will start on Imdur. If no improvement after pulmonary evaluation and on Imdur --> might consider repeat cath.   3. Leg cramp/HLD - Worsen since Crestor restarted few months ago. Will hold to reassess.  - 09/02/2015: Cholesterol 72; HDL 30; LDL Cholesterol 27; Triglycerides 74; VLDL 15  4. PAF - Maintaining sinus rhythm. Continue Multaq and Xarelto.   5. HTN - stable. Not on any regimen.   6. Anemia - Last Hgb 10.4 09/02/15. Slightly lower from baseline at that time and advised to f/u with PCP. No bleeding.   Medication Adjustments/Labs and Tests Ordered: Current medicines are reviewed at length with the patient today.  Concerns regarding medicines are outlined above.  Medication changes, Labs and Tests ordered today are listed in the Patient Instructions below. Patient Instructions  Medication Instructions:  Your physician has recommended you make the following change in your medication:  1- START Imdur 30 mg by mouth daily 2- STOP Crestor  Labwork: NONE  Testing/Procedures: NONE  Follow-Up: Your physician wants you to follow-up next week with PA/NP when Dr. Radford Pax is in office.  Your physician recommends that you schedule a follow-up appointment in 1 to 2 days with your primary care doctor  or pulmonologist for your COPD.    If you need a refill on your cardiac medications before your next appointment, please call your pharmacy.       Jarrett Soho, PA  10/14/2015 11:46 AM    [facility] [address], [place], [place]  [address] Phone: [phone]; Fax: [phone]

## 2015-10-17 ENCOUNTER — Ambulatory Visit (INDEPENDENT_AMBULATORY_CARE_PROVIDER_SITE_OTHER): Payer: Medicare Other | Admitting: Internal Medicine

## 2015-10-17 ENCOUNTER — Other Ambulatory Visit (INDEPENDENT_AMBULATORY_CARE_PROVIDER_SITE_OTHER): Payer: Medicare Other

## 2015-10-17 ENCOUNTER — Ambulatory Visit (INDEPENDENT_AMBULATORY_CARE_PROVIDER_SITE_OTHER)
Admission: RE | Admit: 2015-10-17 | Discharge: 2015-10-17 | Disposition: A | Discharge status: Home / Self Care | Payer: Medicare Other | Source: Ambulatory Visit | Attending: Internal Medicine | Admitting: Internal Medicine

## 2015-10-17 ENCOUNTER — Encounter: Payer: Self-pay | Admitting: Internal Medicine

## 2015-10-17 VITALS — BP 122/66 | HR 98 | Ht 71.0 in | Wt 205.0 lb

## 2015-10-17 DIAGNOSIS — D508 Other iron deficiency anemias: Secondary | ICD-10-CM | POA: Diagnosis not present

## 2015-10-17 DIAGNOSIS — R0600 Dyspnea, unspecified: Secondary | ICD-10-CM | POA: Diagnosis not present

## 2015-10-17 DIAGNOSIS — J449 Chronic obstructive pulmonary disease, unspecified: Secondary | ICD-10-CM

## 2015-10-17 DIAGNOSIS — R0602 Shortness of breath: Secondary | ICD-10-CM | POA: Insufficient documentation

## 2015-10-17 LAB — CBC WITH DIFFERENTIAL/PLATELET
BASOS ABS: 0 10*3/uL (ref 0.0–0.1)
Basophils Relative: 0.2 % (ref 0.0–3.0)
EOS ABS: 0.3 10*3/uL (ref 0.0–0.7)
Eosinophils Relative: 3.6 % (ref 0.0–5.0)
HCT: 26.6 % — ABNORMAL LOW (ref 39.0–52.0)
Hemoglobin: 8.1 g/dL — ABNORMAL LOW (ref 13.0–17.0)
LYMPHS ABS: 1.4 10*3/uL (ref 0.7–4.0)
Lymphocytes Relative: 16.1 % (ref 12.0–46.0)
MCHC: 30.2 g/dL (ref 30.0–36.0)
MCV: 69 fl — ABNORMAL LOW (ref 78.0–100.0)
Monocytes Absolute: 0.9 10*3/uL (ref 0.1–1.0)
Monocytes Relative: 10.1 % (ref 3.0–12.0)
NEUTROS ABS: 6.3 10*3/uL (ref 1.4–7.7)
NEUTROS PCT: 70 % (ref 43.0–77.0)
PLATELETS: 234 10*3/uL (ref 150.0–400.0)
RBC: 3.86 Mil/uL — ABNORMAL LOW (ref 4.22–5.81)
RDW: 16.8 % — ABNORMAL HIGH (ref 11.5–15.5)
WBC: 9 10*3/uL (ref 4.0–10.5)

## 2015-10-17 LAB — BASIC METABOLIC PANEL
BUN: 10 mg/dL (ref 6–23)
CHLORIDE: 106 meq/L (ref 96–112)
CO2: 29 mEq/L (ref 19–32)
CREATININE: 1.07 mg/dL (ref 0.40–1.50)
Calcium: 8.8 mg/dL (ref 8.4–10.5)
GFR: 71.16 mL/min (ref >60.00)
Glucose, Bld: 111 mg/dL — ABNORMAL HIGH (ref 70–99)
POTASSIUM: 4.4 meq/L (ref 3.5–5.1)
Sodium: 141 mEq/L (ref 135–145)

## 2015-10-17 LAB — IBC PANEL
IRON: 6 ug/dL — AB (ref 42–165)
Saturation Ratios: 1.3 % — ABNORMAL LOW (ref 20.0–50.0)
TRANSFERRIN: 335 mg/dL (ref 212.0–360.0)

## 2015-10-17 MED ORDER — BUDESONIDE-FORMOTEROL FUMARATE 160-4.5 MCG/ACT IN AERO: 2.0000 | INHALATION_SPRAY | Freq: Two times a day (BID) | RESPIRATORY_TRACT | 0 refills | Status: DC

## 2015-10-17 NOTE — Progress Notes (Signed)
Subjective:    Patient ID: Travis [name], male    DOB: 06/30/1940,    MRN: [id number]    Brief patient profile:  75  yo quit smoking 10/13/15  with   COPD II  w/ asthmatic component w/ Hx of Lung cancer (sqaumous cell ) s/p LUL lobectomy/Rad seeds around 2008 Hx of OSA -on CPAP, Hx of CAD, Atrial Fib on pradaxa   History of Present Illness  NP ov :  05/24/13 [facility] follow up  Returns for post hospital follow up .  Recent admit for RLL CAP , COPD exacerbation .  Tx w/ IV abx,  And discharged on omnicef .  Stay compliacted by Atrial fib with RVR. Improved with cardizem drip. Cards consult with plan to cont mutltaq and pradaxa.  Pt reports is 85% improved since discharge. Still having some lingering wheezing.   Denies any increased SOB, tightness, f/c/s, hemoptysis, nausea, vomiting.  finished abx this morning.  hasn't smoked since 5/10-encouraged on smoking cessation.  On ACE i   rec Continue on current regimen.  Take Spiriva daily  Great job on not smoking .  Mucinex DM Twice daily  As needed  Cough/congestion  Please contact office for sooner follow up if symptoms do not improve or worsen or seek emergency care     11/20/2014  Consultation/Amma Crear re: active smoker/ GOLD II copd  Not on active meds  Chief Complaint  Patient presents with  . Pulmonary Consult    Former patient of Dr Joya Gaskins. Pt needing DOT clearance. Pt states that his breathing is doing well. He denies any co's today.    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  Stop  lisinopril and start ibesartan 75 mg daily - ok to break in half if too strong and just take a half pill daily      10/17/2015 acute extended ov/Klinton Candelas re:  GOLD II CPD no maint rx consistently  Chief Complaint  Patient presents with  . Follow-up    Pt states having increased DOE x 3-4 months. He states he gets SOB with any exertion at all, esp walking an incline. He has some chest tightness on exertion. He has occ cough with clear sputum.    unsure of meds/ took ? One pfff symb this am with some relief, sleeping fine on cpap   Indolent onset/progressive x 3-4 m Doe = MMRC3 = can't walk 100 yards even at a slow pace at a flat grade s stopping due to sob    No obvious day to day or daytime variability or assoc excess/ purulent sputum or mucus plugs    cp or overt sinus or hb symptoms. No unusual exp hx or h/o childhood pna/ asthma or knowledge of premature birth.  Sleeping ok without nocturnal  or early am exacerbation  of respiratory  c/o's or need for noct saba. Also denies any obvious fluctuation of symptoms with  weather or environmental changes or other aggravating or alleviating factors except as outlined above   Current Medications, Allergies, Complete Past Medical History, Past Surgical History, Family History, and Social History were reviewed in [employer] record.  ROS  The following are not active complaints unless bolded sore throat, dysphagia, dental problems, itching, sneezing,  nasal congestion or excess/ purulent secretions, ear ache,   fever, chills, sweats, unintended wt loss, classically pleuritic or exertional cp, hemoptysis,  orthopnea pnd or leg swelling, presyncope, palpitations, abdominal pain, anorexia, nausea, vomiting, diarrhea  or change in bowel or bladder habits, change in stools or urine, dysuria,hematuria,  rash, arthralgias, visual complaints, headache, numbness, weakness or ataxia or problems with walking or coordination,  change in mood/affect  or memory.         Objective:   Physical Exam GEN: A/Ox3; pleasant , NAD, elderly wm with  pseudowheezing improves with plm    10/17/2015     205  11/20/14 201 lb (91.173 kg)  11/20/14 200 lb (90.719 kg)  04/29/14 207 lb (93.895 kg)    Vital signs reviewed - Note on arrival 02 sats  99% on RA      HEENT:  [place]/AT,  EACs-clear, TMs-wnl, NOSE-clear drainage  THROAT-clear, no lesions, no postnasal drip or exudate noted.   NECK:  Supple w/ fair ROM; no JVD; normal carotid impulses w/o bruits; no thyromegaly or nodules palpated; no lymphadenopathy.    RESP  Diminished BS in bases , late bilateral exp rhonchi -   CARD:  RRR, no m/r/g  , no peripheral edema, pulses intact, no cyanosis or clubbing.  GI:  Mod obese/  Soft & nt; nml bowel sounds; no organomegaly or masses detected. / pos hoover's at end insp   Musco: Warm bil, no deformities or joint swelling noted.   Neuro: alert, no focal deficits noted.    Skin: Warm, no lesions or rashes      CXR PA and Lateral:   10/17/2015 :    I personally reviewed images and agree with radiology impression as follows:       No active cardiopulmonary disease. No evidence of pneumonia. Stable postsurgical changes.   Labs ordered/ reviewed:      Chemistry      Component Value Date/Time   NA 141 10/17/2015 0934   K 4.4 10/17/2015 0934   CL 106 10/17/2015 0934   CO2 29 10/17/2015 0934   BUN 10 10/17/2015 0934   CREATININE 1.07 10/17/2015 0934   CREATININE 1.09 09/02/2015 0900      Component Value Date/Time   CALCIUM 8.8 10/17/2015 0934   ALKPHOS 62 09/02/2015 0900   AST 12 09/02/2015 0900   ALT 9 09/02/2015 0900   BILITOT 0.5 09/02/2015 0900        Lab Results  Component Value Date   WBC 9.0 10/17/2015   HGB 8.1 Repeated and verified X2. (L) 10/17/2015   HCT 26.6 Repeated and verified X2. (L) 10/17/2015   MCV 69.0 Repeated and verified X2. (L) 10/17/2015   PLT 234.0 10/17/2015        Lab Results  Component Value  Date   TSH 1.27 09/02/2015     Lab Results  Component Value Date   PROBNP 1,192.0 (H) 05/14/2013            Assessment & Plan:

## 2015-10-17 NOTE — Patient Instructions (Signed)
symbicort 160 Take 2 puffs first thing in am and then another 2 puffs about 12 hours later.     Pantoprazole (protonix) 40 mg   Take  30-60 min before first meal of the day and Pepcid (famotidine)  20 mg one @  bedtime until return to office - this is the best way to tell whether stomach acid is contributing to your problem.    GERD (REFLUX)  is an extremely common cause of respiratory symptoms just like yours , many times with no obvious heartburn at all.    It can be treated with medication, but also with lifestyle changes including elevation of the head of your bed (ideally with 6 inch  bed blocks),  Smoking cessation, avoidance of late meals, excessive alcohol, and avoid fatty foods, chocolate, peppermint, colas, red wine, and acidic juices such as orange juice.  NO MINT OR MENTHOL PRODUCTS SO NO COUGH DROPS  USE SUGARLESS CANDY INSTEAD (Jolley ranchers or Stover's or Life Savers) or even ice chips will also do - the key is to swallow to prevent all throat clearing. NO OIL BASED VITAMINS - use powdered substitutes.     Please remember to go to the lab  department downstairs for your tests - we will call you with the results when they are available.  Please schedule a follow up office visit in 4 weeks, sooner if needed

## 2015-10-20 ENCOUNTER — Other Ambulatory Visit: Payer: Self-pay | Admitting: Internal Medicine

## 2015-10-20 ENCOUNTER — Encounter: Payer: Self-pay | Admitting: Cardiology

## 2015-10-20 MED ORDER — FERROUS SULFATE 325 (65 FE) MG PO TABS: 325.0000 mg | ORAL_TABLET | Freq: Every day | ORAL | 2 refills | Status: DC

## 2015-10-20 NOTE — Progress Notes (Signed)
LMTCB

## 2015-10-20 NOTE — Progress Notes (Signed)
Spoke with pt's spouse and notified of results per Dr. Melvyn Novas. Pt verbalized understanding and denied any questions. Rx was sent to pharm.

## 2015-10-21 ENCOUNTER — Ambulatory Visit (INDEPENDENT_AMBULATORY_CARE_PROVIDER_SITE_OTHER): Payer: Medicare Other | Admitting: Cardiology

## 2015-10-21 ENCOUNTER — Encounter: Payer: Self-pay | Admitting: Cardiology

## 2015-10-21 VITALS — BP 132/80 | HR 61 | Ht 71.0 in | Wt 204.1 lb

## 2015-10-21 DIAGNOSIS — Z8249 Family history of ischemic heart disease and other diseases of the circulatory system: Secondary | ICD-10-CM

## 2015-10-21 DIAGNOSIS — D509 Iron deficiency anemia, unspecified: Secondary | ICD-10-CM

## 2015-10-21 DIAGNOSIS — M545 Low back pain: Secondary | ICD-10-CM | POA: Diagnosis not present

## 2015-10-21 MED ORDER — ROSUVASTATIN CALCIUM 10 MG PO TABS: 10.0000 mg | ORAL_TABLET | Freq: Every day | ORAL | 3 refills | Status: DC

## 2015-10-21 NOTE — Patient Instructions (Signed)
Medication Instructions:  Your physician has recommended you make the following change in your medication:  1.  RESTART Crestor 10 mg taking 1 tablet daily   Labwork: None ordered  Testing/Procedures: Your physician has requested that you have an abdominal aorta duplex. During this test, an ultrasound is used to evaluate the aorta. Allow 30 minutes for this exam. Do not eat after midnight the day before and avoid carbonated beverages  You have been referred to GASTROENTEROLOGY.  They will contact you with an appointment.   Follow-Up: Your physician recommends that you schedule a follow-up appointment in: 2 [place], IF SHE'S NOT AVAILABLE, BRITTANY SIMMONS, PA-C   Any Other Special Instructions Will Be Listed Below (If Applicable).

## 2015-10-21 NOTE — Progress Notes (Signed)
10/21/2015 Travis [name]   1940-11-09  [id number]  Primary Physician Corine Shelter, PA-C Primary Cardiologist: Dr. Radford Pax    Reason for Visit/CC: F/u for Dyspnea and Fatigue   HPI: Travis [name] a 75 y.o.malewith history of CAD (s/p PCI of the RCA 1994 with repeat cath 1999 with occluded RCA with left to right collaterals, PCI of LAD 2007, PCI of LAD 2008 for instent thrombosis, PCI 08/2015 with DES to LAD due to ISR with known L-R collaterals to RCA), OSA on CPAP, dyslipidemia, PAF on Xarelto, HTN, COPD, h/o lung CA, ongoing tobacco abuse who presents for evaluation of fatigue and shortness of breath.   He was recently evaluated for fatigue/decreased exercise tolerance with abnl nuc which lead to [facility] 08/22/15 revealing 80% ISR of LAD tx with DES with known CTO of RCA with L-R collaterals, mildly elevated LVEDP, EF 50-55%, trivial MR. It was recommended he be for ASA/Xarelto and Plavix for one month, then stop ASA. Cath note indicates "Based on 3 overlapping stents, would probably continue Plavix lifelong if possible and no bleeding issues." His Hgb was reported to drop to 9.5 post PCI without acute bleeding reported . 2D echo 08/19/15: EF 15-17%, normal diastolic parameters, mild RAE, PASP 37.    He was seen in clinic 1 week ago by [facility], PA-C, and complained of continued dyspnea and chest pain. Per office note, his COPD inhalers were discussed and it appeared that the patient was taking meds incorrectly. He was using his rescue inhaler daily, thinking it was his maintenence inhaler. Per note, he had wheezing on physical exam. Pt was educated on proper use. He was also started on Imdur.   He presents back for  f/u. He is here with his wife. He notes is still tired and fatigued. He continues to have exertional chest tightness and exertion, with simple activities, such as getting dressed. No resting symptoms. No improvement with recent medication changes. It appears that since his  last cardiology visit, he was seen by Dr. Melvyn Novas on 10/17/15, who checked a CBC and iron panel. He has iron deficiency anemia. Hgb was 8.1.  Fe was 6. RR [42-165] ug/dL. He was placed on supplemental ferrous sulfate, 325 mg.  The patient reports that he has never had a colonoscopy. He denies melena. No indigestion. He is on a PPI. He denies unexplained weight loss.  In addition, he also reports h/o LBP x 1 year. He is a chronic smoker and has been for the past 60 years. He has a family h/o AAA. His wife believes 2 of his brothers and 1 sister have AAA. He has never been screened for this. He denies abdominal pain. BP is stable today at 130/80.    Current Meds  Medication Sig  . acetaminophen (TYLENOL) 325 MG tablet Take 2 tablets (650 mg total) by mouth every 4 (four) hours as needed for headache or mild pain.  . budesonide-formoterol (SYMBICORT) 160-4.5 MCG/ACT inhaler Inhale 2 puffs into the lungs 2 (two) times daily.  . clopidogrel (PLAVIX) 75 MG tablet Take 1 tablet (75 mg total) by mouth daily with breakfast.  . ferrous sulfate (FERROUSUL) 325 (65 FE) MG tablet Take 1 tablet (325 mg total) by mouth daily with breakfast.  . isosorbide mononitrate (IMDUR) 30 MG 24 hr tablet Take 1 tablet (30 mg total) by mouth daily.  . MULTAQ 400 MG tablet TAKE 1 TABLET BY MOUTH TWICE A DAY 30  . nitroGLYCERIN (NITROSTAT) 0.4 MG SL tablet Place 0.4  mg under the tongue every 5 (five) minutes as needed for chest pain. X 3 doses  . pantoprazole (PROTONIX) 40 MG tablet Take 1 tablet (40 mg total) by mouth daily.  . traZODone (DESYREL) 100 MG tablet Take 200 mg by mouth at bedtime.   Alveda Reasons 20 MG TABS tablet Take 1 tablet (20 mg total) by mouth daily.   No Known Allergies Past Medical History:  Diagnosis Date  . AAA family hx 11/20/2014  . CAD (coronary artery disease)    s/p RCA atherectomy 1994, cath 1999 with occluded RCA with left to right collaterals, PCI of LAD 2007, s/p PCI of LAD for instent  thrombosis 2008, PCI 08/2015 with DES to LAD due to ISR with known L-R collaterals to RCA  . Dyslipidemia   . Hyperlipidemia   . Hypertension   . Insomnia   . Lung cancer [facility])    s/p lobectomy, chemo and radiation therapy  . OSA (obstructive sleep apnea)    intolerant to CPAP  . PAF (paroxysmal atrial fibrillation) ([place])   . Psoriasis   . Tobacco abuse    Family History  Problem Relation Age of Onset  . Heart attack Father   . Heart disease Mother   . Heart attack Mother   . Coronary artery disease Brother   . Lymphoma Brother   . Breast cancer Sister   . Diabetes Sister   . Heart disease Sister    Past Surgical History:  Procedure Laterality Date  . APPENDECTOMY    . CARDIAC CATHETERIZATION  1999  . CARDIAC CATHETERIZATION N/A 08/22/2015   Procedure: Left Heart Cath and Coronary Angiography;  Surgeon: Leonie Man, MD;  Location: [place] [facility];  Service: Cardiovascular;  Laterality: N/A;  . CARDIAC CATHETERIZATION N/A 08/22/2015   Procedure: Coronary Stent Intervention;  Surgeon: Leonie Man, MD;  Location: [place] [facility];  Service: Cardiovascular;  Laterality: N/A;  . EXPLORATORY LAPAROTOMY     secondary to trauma  . multiple PCT's to RCA and LAD  2007/2008  . s/p lung lobectomy  2007  . VIDEO BRONCHOSCOPY N/A 08/28/2012   Procedure: VIDEO BRONCHOSCOPY WITHOUT FLUORO;  Surgeon: Elsie Stain, MD;  Location: [place];  Service: Cardiopulmonary;  Laterality: N/A;   Social History   Social History  . Marital status: Married    Spouse name: N/A  . Number of children: 3  . Years of education: N/A   Occupational History  . [employer]   Social History Main Topics  . Smoking status: Former Smoker    Packs/day: 0.75    Years: 58.00    Types: Cigarettes    Quit date: 10/14/2015  . Smokeless tobacco: Former Systems developer    Types: Chew    Quit date: 07/04/2012     Comment: started smoking at age 21.  Marland Kitchen Alcohol use No  . Drug use: No  .  Sexual activity: Not on file   Other Topics Concern  . Not on file   Social History Narrative  . No narrative on file     Review of Systems: General: negative for chills, fever, night sweats or weight changes.  Cardiovascular: + for chest pain, + dyspnea on exertion, - edema, orthopnea, palpitations, paroxysmal nocturnal dyspnea or shortness of breath Dermatological: negative for rash Respiratory: negative for cough or wheezing Urologic: negative for hematuria Abdominal: negative for nausea, vomiting, diarrhea, bright red blood per rectum, melena, or hematemesis Neurologic: negative for visual changes, syncope,  or dizziness All other systems reviewed and are otherwise negative except as noted above.   Physical Exam:  Blood pressure 132/80, pulse 61, height '5\' 11"'$  (1.803 m), weight 204 lb 1.9 oz (92.6 kg).  General appearance: alert, cooperative and no distress Neck: no carotid bruit and no JVD Lungs: clear to auscultation bilaterally Heart: regular rate and rhythm, S1, S2 normal, no murmur, click, rub or gallop Abdomen: soft, non tender, + pulsation noted proximal to and left of the umbilicus  Extremities: no LEE Pulses: 2+ and symmetric Skin: warm and dry Neurologic: Grossly normal  EKG not performed   ASSESSMENT AND PLAN:   1. Exertional Dyspnea/Fatigue/ CP: suspect symptoms are secondary to iron deficiency anemia, on top of CAD. Continue management of IDA with supplemental Fe. Continue medications for CAD. Reassess symptoms after several weeks of Fe supplementation. Suspect symptoms will improve once Hgb and Fe levels improve.   2. Iron Deficiency Anemia: Recent CBC 10/17/15 showed a Hgb of 8.1. Fe was 6. He denies melena/ hematochezia. No weight loss. He was on triple therapy following recent PCI with ASA, Plavix and Xarelto x 1 month. Hgb post cath was 9.5. He is now on dual therapy with Plavix and Xarelto. He is well over the age of 42 and has never had a colonoscopy. Will  refer to GI for w/u to r/o GIB as the cause of his anemia. He may not be able to get an endoscopy/colonoscopy done right away given he is on Plavix for recent PCI/DES placement. He may need to wait a few months, before this is held. His Xarelto will also likely need to be held if procedures needed.   3. CAD: outlined above. Recent PCI 08/2015 for LAD ISR. On Plavix and Xarelto. He has had recurrent CP and exertional dyspnea that resolves with rest. Suspect his anemia is contributing to symptoms. Continue medical therapy. He is on Imdur and Crestor along with Plavix. VSS.   4. PAF: RRR on exam. On AAD therapy with Multaq. Xarelto for a/c.   5. AAA Screening: patient is male, a smoker, over the age of 60 w/ family h/o AAA if first degree relatives. Also with 1 year h/o LBP. Physical exam is notable for abdominal pulsations noted proximal and to the left of the umbilicus. No palpable mass by my exam. Will need to r/o AAA. Will arrange for abdominal US. Further w/u pending [place] results.   6. Tobacco Use: smoking cessation advised.  7. HLD: on statin therapy with Crestor.   8. HTN: BP is controlled.   9. COPD: is now using inhalers correctly. Lungs are CTAB w/o wheezing.   10. H/O Lung Cancer: he continues to smoke. Recent CXR ordered by Dr. Melvyn Novas 10/17/15 unremarkable.    PLAN  GI referral. Abdominal US. Continue medical management for CAD, PAF and anemia. F/u in 2 weeks. Will notify Dr. Radford Pax of change in patient status.   Darin Redmann PA-C 10/21/2015 2:21 PM

## 2015-10-22 ENCOUNTER — Other Ambulatory Visit: Payer: Self-pay | Admitting: Cardiology

## 2015-10-22 ENCOUNTER — Ambulatory Visit: Payer: Medicare Other | Admitting: Cardiology

## 2015-10-22 DIAGNOSIS — I714 Abdominal aortic aneurysm, without rupture, unspecified: Secondary | ICD-10-CM

## 2015-10-22 DIAGNOSIS — M545 Low back pain: Secondary | ICD-10-CM

## 2015-10-24 ENCOUNTER — Ambulatory Visit (INDEPENDENT_AMBULATORY_CARE_PROVIDER_SITE_OTHER): Payer: Medicare Other | Admitting: Physician Assistant

## 2015-10-24 ENCOUNTER — Encounter: Payer: Self-pay | Admitting: Physician Assistant

## 2015-10-24 VITALS — BP 128/52 | HR 52 | Ht 68.5 in | Wt 202.1 lb

## 2015-10-24 DIAGNOSIS — Z7901 Long term (current) use of anticoagulants: Secondary | ICD-10-CM

## 2015-10-24 DIAGNOSIS — Z955 Presence of coronary angioplasty implant and graft: Secondary | ICD-10-CM | POA: Diagnosis not present

## 2015-10-24 DIAGNOSIS — K219 Gastro-esophageal reflux disease without esophagitis: Secondary | ICD-10-CM

## 2015-10-24 DIAGNOSIS — D509 Iron deficiency anemia, unspecified: Secondary | ICD-10-CM | POA: Diagnosis not present

## 2015-10-24 DIAGNOSIS — K5903 Drug induced constipation: Secondary | ICD-10-CM

## 2015-10-24 NOTE — Progress Notes (Signed)
Chief Complaint:Anemia, GERD, Constipation  HPI: Travis [name] is a 75 year old Caucasian male, with past medical history of recent stent placement 08/2015 for his CAD, he is maintained on Plavix and Xarelto, OSA, paroxysmal A. fib, hypertension, hyperlipidemia, who was referred to me by Corine Shelter, PA-C for a complaint of iron deficiency anemia.    Most recent labs were drawn 10/17/15 and shown iron low at 6, percent saturation low at 1.3%, CBC showed a hemoglobin low at 8.1, down from 10.4 a month ago as well as an MCV low at 69 and BNP showed a normal creatinine at 1.07.  Today, the patient tells me that he has "felt not well for a while". This is even before the time of his last stent placement in August. Since that time he has had absolutely no energy and does have some shortness of breath on exertion. He constantly feels fatigued and can "hardly move". He started on iron supplement 3 days ago, once a day, but does not feel any different currently other than being somewhat constipated. The patient tells he has a history of long-term reflux which is controlled on Pantoprazole 40 mg daily. He has continued this medication. He denies any acute GI bleeding including hematochezia or melena.   Medical history is significant for upcoming further evaluation of an abdominal abdominal aortic aneurysm, patient is undergoing a duplex study on October 26, 17. There was also discussion from cardiologist that he would likely not be able to come off of his anticoagulation medication to have procedures for some time.  Patient denies fever, chills, abdominal pain, dysphagia, syncope, use of NSAIDs or previous episodes of similar feelings.   Past Medical History:  Diagnosis Date  . AAA family hx 11/20/2014  . CAD (coronary artery disease)    s/p RCA atherectomy 1994, cath 1999 with occluded RCA with left to right collaterals, PCI of LAD 2007, s/p PCI of LAD for instent thrombosis 2008, PCI 08/2015 with DES to  LAD due to ISR with known L-R collaterals to RCA  . Dyslipidemia   . Hyperlipidemia   . Hypertension   . Insomnia   . Lung cancer [facility])    s/p lobectomy, chemo and radiation therapy  . OSA (obstructive sleep apnea)    intolerant to CPAP  . PAF (paroxysmal atrial fibrillation) ([place])   . Psoriasis   . Tobacco abuse     Past Surgical History:  Procedure Laterality Date  . APPENDECTOMY    . CARDIAC CATHETERIZATION  1999  . CARDIAC CATHETERIZATION N/A 08/22/2015   Procedure: Left Heart Cath and Coronary Angiography;  Surgeon: Leonie Man, MD;  Location: [place] [facility];  Service: Cardiovascular;  Laterality: N/A;  . CARDIAC CATHETERIZATION N/A 08/22/2015   Procedure: Coronary Stent Intervention;  Surgeon: Leonie Man, MD;  Location: [place] [facility];  Service: Cardiovascular;  Laterality: N/A;  . EXPLORATORY LAPAROTOMY     secondary to trauma  . multiple PCT's to RCA and LAD  2007/2008  . s/p lung lobectomy  2007  . VIDEO BRONCHOSCOPY N/A 08/28/2012   Procedure: VIDEO BRONCHOSCOPY WITHOUT FLUORO;  Surgeon: Elsie Stain, MD;  Location: [place];  Service: Cardiopulmonary;  Laterality: N/A;    Current Outpatient Prescriptions  Medication Sig Dispense Refill  . budesonide-formoterol (SYMBICORT) 160-4.5 MCG/ACT inhaler Inhale 2 puffs into the lungs 2 (two) times daily. 1 Inhaler 0  . clopidogrel (PLAVIX) 75 MG tablet Take 1 tablet (75 mg total) by mouth daily with breakfast. 90 tablet  3  . ferrous sulfate (FERROUSUL) 325 (65 FE) MG tablet Take 1 tablet (325 mg total) by mouth daily with breakfast. 90 tablet 2  . isosorbide mononitrate (IMDUR) 30 MG 24 hr tablet Take 1 tablet (30 mg total) by mouth daily. 90 tablet 3  . MULTAQ 400 MG tablet TAKE 1 TABLET BY MOUTH TWICE A DAY 30 60 tablet 10  . nitroGLYCERIN (NITROSTAT) 0.4 MG SL tablet Place 0.4 mg under the tongue every 5 (five) minutes as needed for chest pain. X 3 doses    . pantoprazole (PROTONIX) 40 MG tablet Take 1  tablet (40 mg total) by mouth daily. 90 tablet 3  . rosuvastatin (CRESTOR) 10 MG tablet Take 1 tablet (10 mg total) by mouth daily. 90 tablet 3  . traZODone (DESYREL) 100 MG tablet Take 200 mg by mouth at bedtime.     Alveda Reasons 20 MG TABS tablet Take 1 tablet (20 mg total) by mouth daily. 90 tablet 3   No current facility-administered medications for this visit.     Allergies as of 10/24/2015  . (No Known Allergies)    Family History  Problem Relation Age of Onset  . Heart attack Father   . Heart disease Mother   . Heart attack Mother   . Coronary artery disease Brother   . Lymphoma Brother   . Breast cancer Sister   . Diabetes Sister   . Heart disease Sister     Social History   Social History  . Marital status: Married    Spouse name: N/A  . Number of children: 3  . Years of education: N/A   Occupational History  . [employer]   Social History Main Topics  . Smoking status: Former Smoker    Packs/day: 0.75    Years: 58.00    Types: Cigarettes    Quit date: 10/14/2015  . Smokeless tobacco: Former Systems developer    Types: Chew    Quit date: 07/04/2012     Comment: started smoking at age 7.  Marland Kitchen Alcohol use No  . Drug use: No  . Sexual activity: Not on file   Other Topics Concern  . Not on file   Social History Narrative  . No narrative on file    Review of Systems:     Constitutional:Positive for weakness and fatigue No weight loss, fever or chills HEENT: Eyes: No change in vision               Ears, Nose, Throat:  No change in hearing or congestion Skin: No rash or itching Cardiovascular: No chest pain, chest pressure or palpitations   Respiratory: Positive for DOE No cough Gastrointestinal: See HPI and otherwise negative Genitourinary: No dysuria or change in urinary frequency Neurological: No headache, dizziness or syncope Musculoskeletal: No new muscle or joint pain Hematologic: No bleeding or bruising Psychiatric: No history of depression or  anxiety    Physical Exam:  Vital signs: Ht 5' 8.5" (1.74 m)   Wt 202 lb 2 oz (91.7 kg)   BMI 30.28 kg/m   General:   Pleasant Caucasian male appears to be in NAD, Well developed, Well nourished, alert and cooperative Head:  Normocephalic and atraumatic. Eyes:   PEERL, EOMI. No icterus. Conjunctiva pink. Ears:  Normal auditory acuity. Neck:  Supple Throat: Oral cavity and pharynx without inflammation, swelling or lesion.  Lungs: Respirations even and unlabored. Lungs clear to auscultation bilaterally.   No wheezes, crackles, or rhonchi.  Heart:  Normal S1, S2. No MRG. Regular rate and rhythm. No peripheral edema, cyanosis or pallor.  Abdomen:  Soft, nondistended, nontender. No rebound or guarding. Normal bowel sounds. No appreciable masses or hepatomegaly. Positive pulsation noted proximal to and left of the umbilicus Rectal:  Not performed.  Msk:  Symmetrical without gross deformities. Peripheral pulses intact.  Extremities:  Without edema, no deformity or joint abnormality. Normal ROM, normal sensation. Neurologic:  Alert and  oriented x4;  grossly normal neurologically. Skin:   Dry and intact without significant lesions or rashes. Psychiatric: Oriented to person, place and time. Demonstrates good judgement and reason without abnormal affect or behaviors.  RELEVANT LABS AND IMAGING: CBC    Component Value Date/Time   WBC 9.0 10/17/2015 0934   RBC 3.86 (L) 10/17/2015 0934   HGB 8.1 Repeated and verified X2. (L) 10/17/2015 0934   HGB 15.9 01/16/2009 1110   HCT 26.6 Repeated and verified X2. (L) 10/17/2015 0934   HCT 47.2 01/16/2009 1110   PLT 234.0 10/17/2015 0934   PLT 156 01/16/2009 1110   MCV 69.0 Repeated and verified X2. (L) 10/17/2015 0934   MCV 87.6 01/16/2009 1110   MCH 23.8 (L) 09/02/2015 0900   MCHC 30.2 10/17/2015 0934   RDW 16.8 (H) 10/17/2015 0934   RDW 14.0 01/16/2009 1110   LYMPHSABS 1.4 10/17/2015 0934   LYMPHSABS 2.2 01/16/2009 1110   MONOABS 0.9  10/17/2015 0934   MONOABS 0.7 01/16/2009 1110   EOSABS 0.3 10/17/2015 0934   EOSABS 0.2 01/16/2009 1110   BASOSABS 0.0 10/17/2015 0934   BASOSABS 0.0 01/16/2009 1110    CMP     Component Value Date/Time   NA 141 10/17/2015 0934   K 4.4 10/17/2015 0934   CL 106 10/17/2015 0934   CO2 29 10/17/2015 0934   GLUCOSE 111 (H) 10/17/2015 0934   BUN 10 10/17/2015 0934   CREATININE 1.07 10/17/2015 0934   CREATININE 1.09 09/02/2015 0900   CALCIUM 8.8 10/17/2015 0934   PROT 6.2 09/02/2015 0900   ALBUMIN 3.9 09/02/2015 0900   AST 12 09/02/2015 0900   ALT 9 09/02/2015 0900   ALKPHOS 62 09/02/2015 0900   BILITOT 0.5 09/02/2015 0900   GFRNONAA >60 08/23/2015 0359   GFRAA >60 08/23/2015 0359    Assessment: 1. Iron deficiency anemia: Recently diagnosed after time of last stent placement in August, hemoglobin is now down to 8.1 on 10/17/15, iron low, patient was started on supplementation 3 days ago once daily dosing; consider AVM versus mass versus other 2. Chronic anticoagulation: With Xarelto and Plavix, patient unable to come off of these as he just had a stent placed 3. Status post cardiac stent placement in August 4. GERD: Long history controlled on pantoprazole 40 mg daily 5. Constipation: Recently with iron supplementation  Plan: 1. Discussed this case with Dr. Henrene Pastor at time of patient's visit. He recommend patient undergo a virtual colonoscopy due to being on chronic anticoagulation with recent stent placement and inability to stop this at this time. This will be done to rule out mass and/or confirm AVMs. 2. Patient to continue his pantoprazole 40 mg daily 3. Patient to increase his iron supplement to 3 times daily 4. Ordered Hemoccult studies 5. We will recheck labs in 5-6 weeks including iron studies and a CBC 6. Patient should follow in clinic in 6 weeks or sooner if necessary with either Dr. Henrene Pastor or myself.  Thank you for your kind consultation, it was a pleasure to treat Travis [name]  today.  Ellouise Newer, PA-C [facility] 10/24/2015, 1:47 PM  Cc: Corine Shelter, PA-C

## 2015-10-24 NOTE — Patient Instructions (Addendum)
If you are age 77 or older, your body mass index should be between 23-30. Your Body mass index is 30.28 kg/m. If this is out of the aforementioned range listed, please consider follow up with your Primary Care Provider.  If you are age 27 or younger, your body mass index should be between 19-25. Your Body mass index is 30.28 kg/m. If this is out of the aformentioned range listed, please consider follow up with your Primary Care Provider.   Your physician has requested that you go to the basement for lab work in 5-6 weeks (before follow up appointment).  Your follow up appointment with Anderson Malta, PA is on 12/01/15 at 1:45pm  Continue Pantoprazole.  Increase Iron to three times a day.  You will be having a virtual colonoscopy with [facility]. Located at [place]. [address]. There telephone number is [phone]. Your appointment is scheduled on 11/13/15 at 8:40am. You will need to pick up your prep for the procedure on 11/07/15.

## 2015-10-24 NOTE — Progress Notes (Signed)
Comprehensive consultation note reviewed. Very complicated patient, medically. Recent cardiac problems as outlined. Sent here for iron deficiency anemia presumed secondary to chronic GI blood loss in a patient on both Plavix and Xarelto. Not a candidate to come off anticoagulation/antiplatelet therapy at this time. Possible etiologies for iron deficiency anemia include hiatal hernia with Cameron erosions, AVMs, acid peptic disease, and neoplasia. Recommend daily PPI which would treat acid peptic disease. Recommend increasing iron to 3 times daily which would address Cameron erosions and AVMs in most cases, and recommend virtual colonoscopy in this high-risk for procedure patient to rule out significant neoplasia. Follow-up blood work and clinical follow-up with Anderson Malta or myself.

## 2015-10-25 ENCOUNTER — Encounter: Payer: Self-pay | Admitting: Internal Medicine

## 2015-10-25 DIAGNOSIS — D509 Iron deficiency anemia, unspecified: Secondary | ICD-10-CM | POA: Insufficient documentation

## 2015-10-25 NOTE — Assessment & Plan Note (Signed)
Iron sats 1.3% 10/17/2015 > started on fes04 and referred to [facility]

## 2015-10-25 NOTE — Assessment & Plan Note (Signed)
Combination of chf / anemia synergistic to decreased activity tol/ worsen doe (see separate a/p)

## 2015-10-25 NOTE — Assessment & Plan Note (Signed)
PFT's   09/16/10   FEV1 2.60 (85 % ) ratio 61  p 14 % improvement from saba with DLCO  74 % corrects to 79  % for alv volume   - spirometry 11/20/2014   FEV1  1.92 (61%) ratio 61  - 10/17/2015  After extensive coaching HFA effectiveness =    90% > resume symbicort 160 2bid   Really only moderate and should be controllable at this point if he takes symbicort consistently and maintains off cigs   I had an extended discussion with the patient reviewing all relevant studies completed to date and  lasting 15 to 20 minutes of a 25 minute visit on the following ongoing concerns:   I reviewed the Fletcher curve with the patient that basically indicates  if you quit smoking when your best day FEV1 is still well preserved (as is clearly  the case here)  it is highly unlikely you will progress to severe disease and informed the patient there was  no medication on the market that has proven to alter the curve/ its downward trajectory  or the likelihood of progression of their disease(unlike other chronic medical conditions such as atheroclerosis where we do think we can change the natural hx with risk reducing meds)    Therefore stopping smoking and maintaining abstinence is the most important aspect of care, not choice of inhalers or for that matter, doctors.    Each maintenance medication was reviewed in detail including most importantly the difference between maintenance and as needed and under what circumstances the prns are to be used.  Please see instructions for details which were reviewed in writing and the patient given a copy.

## 2015-10-27 ENCOUNTER — Encounter: Payer: Self-pay | Admitting: *Deleted

## 2015-10-27 ENCOUNTER — Telehealth: Payer: Self-pay | Admitting: *Deleted

## 2015-10-27 DIAGNOSIS — Z006 Encounter for examination for normal comparison and control in clinical research program: Secondary | ICD-10-CM

## 2015-10-27 NOTE — Telephone Encounter (Signed)
Left message for patient to call research office for month 2 follow up for the [employer] II research study

## 2015-10-27 NOTE — Progress Notes (Signed)
[employer] II research study 2 month telephone follow up completed. Patient states he has iron def. Anemia but no Angina or other problems. No changes in his medication. Next research required visit will be due no later than 03/04/16. Questions encouraged and answered.

## 2015-10-30 ENCOUNTER — Ambulatory Visit (HOSPITAL_COMMUNITY)
Admission: RE | Admit: 2015-10-30 | Discharge: 2015-10-30 | Disposition: A | Discharge status: Home / Self Care | Payer: Medicare Other | Source: Ambulatory Visit | Attending: Cardiovascular Disease | Admitting: Cardiovascular Disease

## 2015-10-30 DIAGNOSIS — I708 Atherosclerosis of other arteries: Secondary | ICD-10-CM | POA: Insufficient documentation

## 2015-10-30 DIAGNOSIS — I700 Atherosclerosis of aorta: Secondary | ICD-10-CM | POA: Diagnosis not present

## 2015-10-30 DIAGNOSIS — I10 Essential (primary) hypertension: Secondary | ICD-10-CM | POA: Insufficient documentation

## 2015-10-30 DIAGNOSIS — I714 Abdominal aortic aneurysm, without rupture, unspecified: Secondary | ICD-10-CM

## 2015-10-30 DIAGNOSIS — M545 Low back pain: Secondary | ICD-10-CM | POA: Diagnosis not present

## 2015-10-30 DIAGNOSIS — I2510 Atherosclerotic heart disease of native coronary artery without angina pectoris: Secondary | ICD-10-CM | POA: Insufficient documentation

## 2015-10-30 DIAGNOSIS — E785 Hyperlipidemia, unspecified: Secondary | ICD-10-CM | POA: Insufficient documentation

## 2015-10-31 ENCOUNTER — Telehealth: Payer: Self-pay

## 2015-10-31 DIAGNOSIS — I714 Abdominal aortic aneurysm, without rupture, unspecified: Secondary | ICD-10-CM

## 2015-10-31 NOTE — Telephone Encounter (Signed)
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  He is now complains of increasing back pain.  Suggest vascular consult.

## 2015-10-31 NOTE — Telephone Encounter (Signed)
Left message to call back  

## 2015-11-03 NOTE — Telephone Encounter (Signed)
Informed patient of results and verbal understanding expressed.   Patient agrees to Vascular Surgery referral. Referral placed.

## 2015-11-03 NOTE — Telephone Encounter (Signed)
-----   Message from Sueanne Margarita, MD sent at 11/02/2015  6:35 PM EDT ----- Please make sure he was referred to vascular surgyer

## 2015-11-04 ENCOUNTER — Ambulatory Visit (INDEPENDENT_AMBULATORY_CARE_PROVIDER_SITE_OTHER): Payer: Medicare Other | Admitting: Cardiology

## 2015-11-04 ENCOUNTER — Encounter: Payer: Self-pay | Admitting: Cardiology

## 2015-11-04 VITALS — BP 122/76 | HR 55 | Ht 68.5 in | Wt 202.0 lb

## 2015-11-04 DIAGNOSIS — I714 Abdominal aortic aneurysm, without rupture, unspecified: Secondary | ICD-10-CM | POA: Insufficient documentation

## 2015-11-04 DIAGNOSIS — D649 Anemia, unspecified: Secondary | ICD-10-CM | POA: Diagnosis not present

## 2015-11-04 LAB — CBC
HEMATOCRIT: 34.1 % — AB (ref 38.5–50.0)
Hemoglobin: 10.1 g/dL — ABNORMAL LOW (ref 13.2–17.1)
MCH: 22.7 pg — AB (ref 27.0–33.0)
MCHC: 29.6 g/dL — AB (ref 32.0–36.0)
MCV: 76.6 fL — AB (ref 80.0–100.0)
MPV: 9.5 fL (ref 7.5–12.5)
Platelets: 257 10*3/uL (ref 140–400)
RBC: 4.45 MIL/uL (ref 4.20–5.80)
RDW: 23.1 % — AB (ref 11.0–15.0)
WBC: 9.8 10*3/uL (ref 3.8–10.8)

## 2015-11-04 NOTE — Progress Notes (Signed)
11/04/2015 Travis [name]   Feb 10, 1940  [id number]  Primary Physician Corine Shelter, PA-C Primary Cardiologist: Dr. Radford Pax   Reason for Visit/CC: CAD, AAA and Anemia   HPI:  Travis [name] a 75 y.o.malewith history of CAD (s/p PCI of the RCA 1994 with repeat cath 1999 with occluded RCA with left to right collaterals, PCI of LAD 2007, PCI of LAD 2008 for instent thrombosis, PCI 08/2015 with DES to LAD due to ISR with known L-R collaterals to RCA), OSA on CPAP, dyslipidemia, PAF on Xarelto, HTN, COPD, h/o lung CA, ongoingtobacco abuse who presents for evaluation of fatigue and shortness of breath.   He was recently evaluated for fatigue/decreased exercise tolerance with abnl nuc which lead to [facility] 08/22/15 revealing 80% ISR of LAD tx with DES with known CTO of RCA with L-R collaterals, mildly elevated LVEDP, EF 50-55%, trivial MR. It was recommended he be for ASA/Xarelto and Plavix for one month, then stop ASA. Cath note indicates "Based on 3 overlapping stents, would probably continue Plavix lifelong if possible and no bleeding issues." His Hgb was reported to drop to 9.5 post PCI without acute bleeding reported . 2D echo 08/19/15: EF 84-16%, normal diastolic parameters, mild RAE, PASP 37.   Following his recent PCI, he continued to have fatigue, exertional dyspnea and chest pain. F/u CBC showed anemia. Anemia panel was suggestive of IDA and he was placed on Fe supplementation per Dr. Melvyn Novas on 10/17/15. Hgb was 8.1.  Fe was 6. RR [42-165] ug/dL.  I saw him for f/u on 10/21/15. He was still symptomatic. It was felt that his exertional dyspnea, fatigue and CP was likely secondary to demand ischemia from IDA in the setting of CAD. Patient informed me that he had never had a colonoscopy. Subsequently, I referred him to GI. He was seen by Ellouise Newer, PA, and plans have been make for a colonoscopy to assess for source of bleed and to r/o malignancy. This is scheduled for 11/13/15 and plans are  made for a CT virtual colonoscopy.   Also of importance, at his last OV 10/21/15, the patient complained of a 1 year h/o LBP. He is a chronic smoker and has been for the past 60 years. He has a family h/o AAA. His wife believes 2 of his brothers and 1 sister have AAA. Subsequently, I arranged from him to be evaluated by abdominal US. This revealed a infrarenal fusiform bi-lobed AAA, largest measuring 3.8 cm x 4.7 cm, extending 9.0 cm in length from mid to distal aorta. Aorto-iliac atherosclerosis, without stenosis. Normal caliber common and external iliac arteries, bilaterally. A referral has been placed for Vascular surgery consult.   He presents back to clinic today for f/u. He reports that he has done well. His symptoms have improved some now that he is taking supplemental Fe. He is currently CP free. VSS. BP is 122/76. HR is 55. Unfortunately, he continues to smoke cigarettes but has cut back significantly. He smokes 1 pack per week, down from 1ppd.     Current Meds  Medication Sig  . budesonide-formoterol (SYMBICORT) 160-4.5 MCG/ACT inhaler Inhale 2 puffs into the lungs 2 (two) times daily.  . clopidogrel (PLAVIX) 75 MG tablet Take 1 tablet (75 mg total) by mouth daily with breakfast.  . ferrous sulfate (FERROUSUL) 325 (65 FE) MG tablet Take 1 tablet (325 mg total) by mouth daily with breakfast.  . isosorbide mononitrate (IMDUR) 30 MG 24 hr tablet Take 1 tablet (30 mg total) by mouth  daily.  . MULTAQ 400 MG tablet TAKE 1 TABLET BY MOUTH TWICE A DAY 30  . nitroGLYCERIN (NITROSTAT) 0.4 MG SL tablet Place 0.4 mg under the tongue every 5 (five) minutes as needed for chest pain. X 3 doses  . pantoprazole (PROTONIX) 40 MG tablet Take 1 tablet (40 mg total) by mouth daily.  . rosuvastatin (CRESTOR) 10 MG tablet Take 1 tablet (10 mg total) by mouth daily.  . traZODone (DESYREL) 100 MG tablet Take 200 mg by mouth at bedtime.   Alveda Reasons 20 MG TABS tablet Take 1 tablet (20 mg total) by mouth daily.     No Known Allergies Past Medical History:  Diagnosis Date  . AAA family hx 11/20/2014  . CAD (coronary artery disease)    s/p RCA atherectomy 1994, cath 1999 with occluded RCA with left to right collaterals, PCI of LAD 2007, s/p PCI of LAD for instent thrombosis 2008, PCI 08/2015 with DES to LAD due to ISR with known L-R collaterals to RCA  . Dyslipidemia   . Hyperlipidemia   . Hypertension   . Insomnia   . Lung cancer [facility])    s/p lobectomy, chemo and radiation therapy  . OSA (obstructive sleep apnea)    intolerant to CPAP  . PAF (paroxysmal atrial fibrillation) ([place])   . Psoriasis   . Tobacco abuse    Family History  Problem Relation Age of Onset  . Heart attack Father   . Heart disease Mother   . Heart attack Mother   . Coronary artery disease Brother   . Lymphoma Brother   . Breast cancer Sister   . Diabetes Sister   . Heart disease Sister   . Colon cancer Neg Hx   . Stomach cancer Neg Hx    Past Surgical History:  Procedure Laterality Date  . APPENDECTOMY    . CARDIAC CATHETERIZATION  1999  . CARDIAC CATHETERIZATION N/A 08/22/2015   Procedure: Left Heart Cath and Coronary Angiography;  Surgeon: Leonie Man, MD;  Location: [place] [facility];  Service: Cardiovascular;  Laterality: N/A;  . CARDIAC CATHETERIZATION N/A 08/22/2015   Procedure: Coronary Stent Intervention;  Surgeon: Leonie Man, MD;  Location: [place] [facility];  Service: Cardiovascular;  Laterality: N/A;  . EXPLORATORY LAPAROTOMY     secondary to trauma  . multiple PCT's to RCA and LAD  2007/2008  . s/p lung lobectomy  2007  . VIDEO BRONCHOSCOPY N/A 08/28/2012   Procedure: VIDEO BRONCHOSCOPY WITHOUT FLUORO;  Surgeon: Elsie Stain, MD;  Location: [facility];  Service: Cardiopulmonary;  Laterality: N/A;   Social History   Social History  . Marital status: Married    Spouse name: N/A  . Number of children: 3  . Years of education: N/A   Occupational History  . [employer]   Social History Main Topics  . Smoking status: Former Smoker    Packs/day: 0.75    Years: 58.00    Types: Cigarettes    Quit date: 10/14/2015  . Smokeless tobacco: Former Systems developer    Types: Chew    Quit date: 07/04/2012     Comment: started smoking at age 57.  Marland Kitchen Alcohol use No  . Drug use: No  . Sexual activity: Not on file   Other Topics Concern  . Not on file   Social History Narrative  . No narrative on file     Review of Systems: General: negative for chills, fever, night sweats or  weight changes.  Cardiovascular: negative for chest pain, dyspnea on exertion, edema, orthopnea, palpitations, paroxysmal nocturnal dyspnea or shortness of breath Dermatological: negative for rash Respiratory: negative for cough or wheezing Urologic: negative for hematuria Abdominal: negative for nausea, vomiting, diarrhea, bright red blood per rectum, melena, or hematemesis Neurologic: negative for visual changes, syncope, or dizziness All other systems reviewed and are otherwise negative except as noted above.   Physical Exam:  Blood pressure 122/76, pulse (!) 55, height 5' 8.5" (1.74 m), weight 202 lb (91.6 kg).  General appearance: alert, cooperative and no distress Neck: no carotid bruit and no JVD Lungs: clear to auscultation bilaterally Heart: regular rate and rhythm, S1, S2 normal, no murmur, click, rub or gallop Extremities: extremities normal, atraumatic, no cyanosis or edema Pulses: 2+ and symmetric Skin: Skin color, texture, turgor normal. No rashes or lesions Neurologic: Grossly normal  EKG sinus bradycardia 55 bpm.   ASSESSMENT AND PLAN:    1. Exertional Dyspnea/Fatigue/ CP: suspect symptoms are secondary to iron deficiency anemia, on top of CAD. Continue management of IDA with supplemental Fe. Continue medications for CAD. Reassess symptoms after several weeks of Fe supplementation. Suspect symptoms will improve once Hgb and Fe levels improve. We will check a f/u CBC  today.   2. Iron Deficiency Anemia: Recent CBC 10/17/15 showed a Hgb of 8.1. Fe was 6. He denies melena/ hematochezia. No weight loss. He was on triple therapy following recent PCI with ASA, Plavix and Xarelto x 1 month. Hgb post cath was 9.5. He is now on dual therapy with Plavix and Xarelto. He is well over the age of 49 and has never had a colonoscopy. He has been referred to GI r/o GIB as the cause of his anemia. He is scheduled for CT virtual colonoscopy on 11/13/15. I spoke with Ellouise Newer, PA-C, with [facility]. He does not need to hold blood thinners for this study. He can continue Plavix and Xarelto. Will order f/u CBC today.   3. CAD: outlined above. Recent PCI 08/2015 for LAD ISR. On Plavix and Xarelto. He has had recurrent CP and exertional dyspnea that resolves with rest. Suspect his anemia is contributing to symptoms. Continue medical therapy. He is on Imdur and Crestor along with Plavix. VSS.   4. PAF: RRR on exam. On AAD therapy with Multaq. Xarelto for a/c.   5. AAA: patient is male, a smoker, over the age of 89 w/ family h/o AAA if first degree relatives. Also with 1 year h/o LBP. Physical exam is notable for abdominal pulsations noted proximal and to the left of the umbilicus. Recent abdominal US confirmed presence of an infrarenal fusiform bi-lobed AAA, largest measuring 3.8 cm x 4.7 cm, extending 9.0 cm in length from mid to distal aorta. A referral for vascular surgery has been placed. Patient encouraged to stop smoking. His HR and BP are both well controlled with medications.   6. Tobacco Use: smoking cessation advised.  7. HLD: on statin therapy with Crestor.   8. HTN: BP is controlled at 122/76. Continue current regimen.   9. COPD: he is now using inhalers correctly. Lungs are CTAB w/o wheezing.   10. H/O Lung Cancer: he continues to smoke. Recent CXR ordered by Dr. Melvyn Novas 10/17/15 unremarkable.    PLAN  Plan for continuation of GI w/u with CT Vitual  Colonoscopy 11/13/15. Vascular consult placed for AAA. F/u with Dr. Radford Pax or myself following Vascular consult.   Travis Jester PA-C 11/04/2015 12:03 PM

## 2015-11-04 NOTE — Patient Instructions (Addendum)
Medication Instructions:   Your physician recommends that you continue on your current medications as directed. Please refer to the Current Medication list given to you today.   If you need a refill on your cardiac medications before your next appointment, please call your pharmacy.  Labwork: CBC    Testing/Procedures:NONE ORDERED  TODAY    Follow-Up: CONTACT OFFICE BACK AFTER  ATTENDED APPOINTMENT WITH SEEING DR EARLY VASCULAR  SPECIALIST AND SCHEDULE AN FOLLOW UP VISIT FOR DR TURNER IN [place]   Any Other Special Instructions Will Be Listed Below (If Applicable).                                                                                                                                                  Abdominal Aortic Aneurysm An aneurysm is a weakened or damaged part of an artery wall that bulges from the normal force of blood pumping through the body. An abdominal aortic aneurysm is an aneurysm that occurs in the lower part of the aorta, the main artery of the body.  The major concern with an abdominal aortic aneurysm is that it can enlarge and burst (rupture) or blood can flow between the layers of the wall of the aorta through a tear (aorticdissection). Both of these conditions can cause bleeding inside the body and can be life threatening unless diagnosed and treated promptly. CAUSES  The exact cause of an abdominal aortic aneurysm is unknown. Some contributing factors are:   A hardening of the arteries caused by the buildup of fat and other substances in the lining of a blood vessel (arteriosclerosis).  Inflammation of the walls of an artery (arteritis).   Connective tissue diseases, such as Marfan syndrome.   Abdominal trauma.   An infection, such as syphilis or staphylococcus, in the wall of the aorta (infectious aortitis) caused by bacteria. RISK FACTORS  Risk factors that contribute to an abdominal aortic aneurysm may include:  Age older than 58 years.    High blood pressure (hypertension).  Male gender.  Ethnicity (white race).  Obesity.  Family history of aneurysm (first degree relatives only).  Tobacco use. PREVENTION  The following healthy lifestyle habits may help decrease your risk of abdominal aortic aneurysm:  Quitting smoking. Smoking can raise your blood pressure and cause arteriosclerosis.  Limiting or avoiding alcohol.  Keeping your blood pressure, blood sugar level, and cholesterol levels within normal limits.  Decreasing your salt intake. In somepeople, too much salt can raise blood pressure and increase your risk of abdominal aortic aneurysm.  Eating a diet low in saturated fats and cholesterol.  Increasing your fiber intake by including whole grains, vegetables, and fruits in your diet. Eating these foods may help lower blood pressure.  Maintaining a healthy weight.  Staying physically active and exercising regularly. SYMPTOMS  The symptoms of abdominal aortic aneurysm may vary depending  on the size and rate of growth of the aneurysm.Most grow slowly and do not have any symptoms. When symptoms do occur, they may include:  Pain (abdomen, side, lower back, or groin). The pain may vary in intensity. A sudden onset of severe pain may indicate that the aneurysm has ruptured.  Feeling full after eating only small amounts of food.  Nausea or vomiting or both.  Feeling a pulsating lump in the abdomen.  Feeling faint or passing out. DIAGNOSIS  Since most unruptured abdominal aortic aneurysms have no symptoms, they are often discovered during diagnostic exams for other conditions. An aneurysm may be found during the following procedures:  Ultrasonography (A one-time screening for abdominal aortic aneurysm by ultrasonography is also recommended for all men aged 79-75 years who have ever smoked).  X-ray exams.  A computed tomography (CT).  Magnetic resonance imaging (MRI).  Angiography or  arteriography. TREATMENT  Treatment of an abdominal aortic aneurysm depends on the size of your aneurysm, your age, and risk factors for rupture. Medication to control blood pressure and pain may be used to manage aneurysms smaller than 6 cm. Regular monitoring for enlargement may be recommended by your caregiver if:  The aneurysm is 3-4 cm in size (an annual ultrasonography may be recommended).  The aneurysm is 4-4.5 cm in size (an ultrasonography every 6 months may be recommended).  The aneurysm is larger than 4.5 cm in size (your caregiver may ask that you be examined by a vascular surgeon). If your aneurysm is larger than 6 cm, surgical repair may be recommended. There are two main methods for repair of an aneurysm:   Endovascular repair (a minimally invasive surgery). This is done most often.  Open repair. This method is used if an endovascular repair is not possible.   This information is not intended to replace advice given to you by your health care provider. Make sure you discuss any questions you have with your health care provider.   Document Released: 09/30/2004 Document Revised: 04/17/2012 Document Reviewed: 01/21/2012 Elsevier Interactive Patient Education 2016 Else[employer] Inc[employer]   Abdominal Aortic Aneurysm Open Repair Abdominal aortic aneurysm open repair is a surgery to fix an abdominal aortic aneurysm. An aneurysm is a weakened or damaged part of an artery wall that bulges out as force is applied from blood pumping through the body. An abdominal aortic aneurysm is an aneurysm that occurs in the lower part of the aorta, the main artery of the body.  Surgery is often done if there is an increased risk that the aneurysm might burst (rupture). The risk of rupture increases as the aneurysm becomes larger. A ruptured aneurysm can cause bleeding inside the body that is life threatening. Before that happens, surgery is needed to fix the condition. Surgery may also be done if the aneurysm  causes symptoms such as pain in the back, abdomen, or side. In this surgery, a tube-shaped piece of man-made material (aortic graft) is placed in the damaged or weakened part of the aorta to repair the area. LET [facility] CARE PROVIDER KNOW ABOUT:   Any allergies you have.   All medicines you are taking, including vitamins, herbs, eye drops, creams, and over-the-counter medicines.   Use of steroids (by mouth or as creams).  Previous problems you or members of your family have had with the use of anesthetics.  Any blood disorders you have.  Previous surgeries you have had.  Smoking history.  Other health problems you have, including any recent symptoms of  colds or infections.  Possibility of pregnancy, if this applies. RISKS AND COMPLICATIONS  Generally, open repair of an abdominal aortic aneurysm is a safe procedure. However, as with any surgical procedure, complications can occur. Possible complications include:  Bleeding after surgery.   Infection.   Lung problems.   Intestine problems.   Heart attack.   Blood clots.   Very low blood pressure.   Kidney damage.   Nerve damage that can cause pain or numbness in the leg.  BEFORE THE PROCEDURE  You may need to have blood tests, a test to check heart rhythm (electrocardiography), or a test to check blood flow (angiography) before the surgery.   Imaging tests will be done to check the size and location of the aneurysm. This could include ultrasonography, a CT scan, or an MRI.   Ask your health care provider about changing or stopping your regular medicines.   Do not eat or drink anything for at least 8 hours before the surgery. Ask your health care provider if it is okay to take any needed medicines with a sip of water.   Do not smoke for as long as possible before the surgery. Smoking increases the risk of a healing problem after surgery.   You may be given medicine that causes you to have bowel  movements. It is important to empty the intestines before this surgery.   Make plans to have someone drive you home after your hospital stay. Also, arrange for someone to help you with activities during recovery. PROCEDURE  Small monitors will be put on your body. They are used to check your heart, blood pressure, and oxygen level.   You will be given medicine to make you sleep through the procedure (general anesthetic). This medicine will be given through an intravenous (IV) access tube that is put into one of your veins.   A tube will be placed in your bladder to drain urine during and after surgery.   A small tube (epidural catheter) may be inserted into the area of your lower spine. It is used to give pain medicine during and after surgery.   Once you are asleep, your abdomen will be cleaned using sterile techniques. The surgeon will then make an incision in the abdomen in the area of the aneurysm.   Clamps are put on the abdominal aorta. One is put above the aneurysm. One is put below it. This stops the blood flow.  The aneurysm is then opened. The graft is sewn to healthy parts of the aorta, right above and below the aneurysm. The walls of the opened aneurysm are wrapped around the graft. It is then sewn closed.   The clamps are removed. This lets the blood flow again.   The incision is closed using stitches or staples.  AFTER THE PROCEDURE   You will stay in a recovery area until the anesthetic has worn off. Your blood pressure and pulse (vital signs) will be checked often.   You will likely go to an intensive care unit before moving to a regular hospital room.   You will be hooked up to machines that check your heart, breathing, and blood pressure.   Your IV access tube and urinary catheter will stay in place for a few days. They will be taken out once your body functions return to normal.   You may be given pain medicine through the epidural catheter for a few  days.   You might also have a tube that runs  through your nose into the stomach to remove fluids. Once the intestines are working again, the tube in the nose will be removed. This usually happens in 2-3 days.   Various tests may be done to check your condition and the repaired aorta. These might include X-rays, electrocardiography, or ultrasonography.   You may be asked to cough and to do deep breathing exercises frequently. This helps prevent a lung infection.   You may have to wear compression stockings or take blood thinners, or both, while you are in the hospital. These stockings help keep blood clots from forming in your legs.   You will need to stay in the hospital for about a week.    This information is not intended to replace advice given to you by your health care provider. Make sure you discuss any questions you have with your health care provider.   Document Released: 12/09/2008 Document Revised: 12/26/2012 Document Reviewed: 05/23/2012 Elsevier Interactive Patient Education 2016 Elsevier Inc.  Abdominal Aortic Aneurysm Endograft Repair Abdominal aortic aneurysm endograft repair is a procedure to fix an abdominal aortic aneurysm. An aneurysm is a weakened or damaged part of an artery wall that bulges out from the normal force of blood pumping through the body. An abdominal aortic aneurysm is an aneurysm that occurs in the lower part of the aorta, the main artery of the body.  The repair procedure is often done if the aneurysm gets so large that it might burst (rupture). A ruptured aneurysm would cause bleeding inside the body that is life threatening. Before that happens, this procedure is needed to fix the condition. The procedure may also be done if the aneurysm causes symptoms such as pain in the back, abdomen, or side. In this procedure, a tube made up of fabric supported by a metal mesh (endograft or stent-graft) is placed in the weakened part of the aorta to repair the  area. This procedure may take 1-3 hours.  LET [facility] CARE PROVIDER KNOW ABOUT:  Any allergies you have.   All medicines you are taking, including vitamins, herbs, eye drops, creams, and over-the-counter medicines.  Anysteroids you are using (by mouth or as creams).   Previous problems you or members of your family have had with the use of anesthetics.   Any blood disorders or history of blood clots.   Previous surgeries you have had.   Other health problems you have.  Possibility of pregnancy, if this applies.  RISKS AND COMPLICATIONS Generally, endograft repair of an abdominal aortic aneurysm is a safe procedure. However, as with any surgical procedure, complications can occur. Possible complications include:  Leaking of blood around the endograft.  Infection.  Damage to surrounding nerves, tissues, or structures.  Displacement of the endograft away from the proper location.   Blockage of blood flow through the graft.  Blood clots.   Kidney problems.  Blockage of blood flow to the legs (rare).  Rupture of the aorta even after successful endograft repair (rare).  BEFORE THE PROCEDURE   You may need to have blood tests, a test to check heart rhythm (electrocardiography), or a test to check blood flow (angiography) done before the day of the procedure.  Imaging tests will be done to help determine the size and location of the aneurysm. This could include ultrasonography, a CT scan, or an MRI.  Ask your health care provider about changing or stopping your regular medicines.  Do not eat or drink anything for at least 8 hours before the  procedure. Ask your health care provider if it is okay to take any needed medicines with a sip of water.  Do not smoke for as long as possible before the surgery.  Make plans to have someone drive you home after your hospital stay. PROCEDURE   You will be given medicine to help you relax (sedative). You may also be  given medicine to make you sleep through the procedure (general anesthetic) or medicine to numb the affected area of the body (local orregional anesthetic). Medicine may be given through an intravenous (IV) access tube that is put into one of your veins.  The groin area will be washed and shaved.   Small cuts (incisions) are usually made on both sides of the groin. Long, thin tubes (catheters) are passed through these incisions, inserted into the artery in your thigh, and moved up into the aneurysm in the aorta.   The health care provider uses live X-ray pictures to guide the endograft through the catheterto the site of the aneurysm.  The endograft is released to seal off the aneurysm and line the aorta. It prevents blood from flowing into the aneurysm and helps prevent it from rupturing. The placement of the endograft is permanent.  X-rays are used to check the position of the endograft and confirm proper placement.  The catheters are taken out, and the incisions are closed with stitches. AFTER THE PROCEDURE   You will need to lie flat for several hours. Bending the leg that has the insertion site can cause it to bleed and swell.  You will then be encouraged to move around several times a day and to gradually increase activity.   Your blood pressure and pulse (vital signs) will be checked often.  You will receive medicines to control pain.  Certain tests may be done to check the function and location of the endograft after your procedure.  You will need to stay in the hospital for about 1-4 days.    This information is not intended to replace advice given to you by your health care provider. Make sure you discuss any questions you have with your health care provider.   Document Released: 05/09/2008 Document Revised: 08/23/2012 Document Reviewed: 05/12/2012 Elsevier Interactive Patient Education [employer].

## 2015-11-13 ENCOUNTER — Other Ambulatory Visit: Payer: Medicare Other

## 2015-11-17 ENCOUNTER — Ambulatory Visit
Admission: RE | Admit: 2015-11-17 | Discharge: 2015-11-17 | Disposition: A | Discharge status: Home / Self Care | Payer: Medicare Other | Source: Ambulatory Visit | Attending: Physician Assistant | Admitting: Physician Assistant

## 2015-11-17 ENCOUNTER — Ambulatory Visit: Payer: Medicare Other | Admitting: Internal Medicine

## 2015-11-17 DIAGNOSIS — Z7901 Long term (current) use of anticoagulants: Secondary | ICD-10-CM

## 2015-11-17 DIAGNOSIS — D509 Iron deficiency anemia, unspecified: Secondary | ICD-10-CM

## 2015-11-19 ENCOUNTER — Other Ambulatory Visit: Payer: Self-pay

## 2015-11-19 DIAGNOSIS — D509 Iron deficiency anemia, unspecified: Secondary | ICD-10-CM

## 2015-11-25 ENCOUNTER — Encounter: Payer: Self-pay | Admitting: Internal Medicine

## 2015-11-25 ENCOUNTER — Ambulatory Visit (INDEPENDENT_AMBULATORY_CARE_PROVIDER_SITE_OTHER): Payer: Medicare Other | Admitting: Internal Medicine

## 2015-11-25 VITALS — BP 118/68 | HR 67 | Ht 71.0 in | Wt 189.8 lb

## 2015-11-25 DIAGNOSIS — F17210 Nicotine dependence, cigarettes, uncomplicated: Secondary | ICD-10-CM

## 2015-11-25 DIAGNOSIS — R0602 Shortness of breath: Secondary | ICD-10-CM

## 2015-11-25 DIAGNOSIS — J449 Chronic obstructive pulmonary disease, unspecified: Secondary | ICD-10-CM

## 2015-11-25 MED ORDER — FAMOTIDINE 20 MG PO TABS: ORAL_TABLET | ORAL | Status: DC

## 2015-11-25 MED ORDER — TIOTROPIUM BROMIDE MONOHYDRATE 2.5 MCG/ACT IN AERS: INHALATION_SPRAY | RESPIRATORY_TRACT | Status: DC

## 2015-11-25 MED ORDER — PREDNISONE 10 MG PO TABS: ORAL_TABLET | ORAL | 0 refills | Status: DC

## 2015-11-25 NOTE — Progress Notes (Signed)
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  Recent admit for RLL CAP , COPD exacerbation .  Tx w/ IV abx,  And discharged on omnicef .  Stay compliacted by Atrial fib with RVR. Improved with cardizem drip. Cards consult with plan to cont mutltaq and pradaxa.  Pt reports is 85% improved since discharge. Still having some lingering wheezing.   Denies any increased SOB, tightness, f/c/s, hemoptysis, nausea, vomiting.  finished abx this morning.  hasn't smoked since 5/10-encouraged on smoking cessation.  On ACE i   rec Continue on current regimen.  Take Spiriva daily  Great job on not smoking .  Mucinex DM Twice daily  As needed  Cough/congestion  Please contact office for sooner follow up if symptoms do not improve or worsen or seek emergency care     11/20/2014  Consultation/Travis [name] re: active smoker/ GOLD II copd  Not on active meds  Chief Complaint  Patient presents with  . Pulmonary Consult    Former patient of Dr Joya Gaskins. Pt needing DOT clearance. Pt states that his breathing is doing well. He denies any co's today.    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  Stop  lisinopril and start ibesartan 75 mg daily - ok to break in half if too strong and just take a half pill daily      10/17/2015 acute extended ov/Travis [name] re:  GOLD II CPD no maint rx   Chief Complaint  Patient presents with  . Follow-up    Pt states having increased DOE x 3-4 months. He states he gets SOB with any exertion at all, esp walking an incline. He has some chest tightness on exertion. He has occ cough with clear sputum.    unsure of meds/ took ? One pfff symb this am with some relief, sleeping fine on cpap   Indolent onset/progressive x 3-4 m Doe = MMRC3 = can't walk 100 yards even at a slow pace at a flat grade s stopping due to sob   rec symbicort 160 Take 2 puffs first thing in am and then another 2 puffs about 12 hours later.  Pantoprazole (protonix) 40 mg   Take  30-60 min before first meal of the day and Pepcid (famotidine)  20 mg one @  bedtime until return to office - this is the best way to tell whether stomach acid is contributing to your problem.   GERD  Please remember to go to the lab  department downstairs for your tests - we will call you with the results when they are available.     11/25/2015  f/u ov/Travis [name] re: copd II/ copd still smoking/ really confused with details of care  Chief Complaint  Patient presents with  . Follow-up    4wk rov - Pt c/o cough x 1 week. Thick mucus production, white in color. Denies CP/chest tightness and wheezing. Using Robitussin OTC for cough and Mucinex DM.  Little relief   again unsure of meds, not clear whether even ever used symb samples/ did not fill rx Cough is worst first thing in am but even then just white,  No purulent sputum or mucus plugs    Doe continues = MMRC3   No obvious day to day or daytime variability or assoc  cp or overt sinus or hb symptoms. No unusual exp hx or h/o childhood pna/ asthma or knowledge of premature birth.  Sleeping ok without nocturnal  or early am exacerbation  of respiratory  c/o's or need for  noct saba. Also denies any obvious fluctuation of symptoms with weather or environmental changes or other aggravating or alleviating factors except as outlined above   Current Medications, Allergies, Complete Past Medical History, Past Surgical History, Family History, and Social History were reviewed in [employer] record.  ROS  The following are not active complaints unless bolded sore throat, dysphagia, dental problems, itching, sneezing,  nasal congestion or excess/ purulent secretions, ear ache,   fever, chills, sweats, unintended wt loss, classically pleuritic or exertional cp, hemoptysis,  orthopnea pnd or leg swelling, presyncope, palpitations, abdominal pain, anorexia, nausea, vomiting, diarrhea  or change in bowel or bladder habits, change in stools or urine, dysuria,hematuria,  rash, arthralgias, visual complaints, headache, numbness, weakness or ataxia or problems with walking or coordination,  change in mood/affect or memory.         Objective:   Physical Exam GEN: A/Ox3; pleasant , NAD, elderly wm with  pseudowheezing improves with plm    11/25/2015      190  10/17/2015     205  11/20/14 201 lb (91.173 kg)  11/20/14 200 lb (90.719 kg)  04/29/14 207 lb (93.895 kg)    Vital signs reviewed - Note on arrival 02 sats  99% on RA      HEENT:  [place]/AT,  EACs-clear, TMs-wnl, NOSE-clear drainage  THROAT-clear, no lesions, no postnasal drip or exudate noted.   NECK:  Supple w/ fair ROM; no JVD; normal carotid impulses w/o bruits; no thyromegaly or nodules palpated; no lymphadenopathy.    RESP  Diminished BS in bases ,  pan exp bilateral  Rhonchi    CARD:  RRR, no m/r/g  , no peripheral edema, pulses intact, no cyanosis or clubbing.  GI:  Mod obese/  Soft & nt; nml bowel sounds; no organomegaly or masses detected. / pos hoover's at end insp   Musco: Warm bil, no deformities or joint swelling noted.   Neuro: alert, no focal deficits noted.    Skin: Warm, no  lesions or rashes      CXR PA and Lateral:   10/17/2015 :    I personally reviewed images and agree with radiology impression as follows:      No active cardiopulmonary disease. No evidence of pneumonia. Stable postsurgical changes.   Labs reviewed:      Chemistry      Component Value Date/Time   NA 141  10/17/2015 0934   K 4.4 10/17/2015 0934   CL 106 10/17/2015 0934   CO2 29 10/17/2015 0934   BUN 10 10/17/2015 0934   CREATININE 1.07 10/17/2015 0934   CREATININE 1.09 09/02/2015 0900      Component Value Date/Time   CALCIUM 8.8 10/17/2015 0934   ALKPHOS 62 09/02/2015 0900   AST 12 09/02/2015 0900   ALT 9 09/02/2015 0900   BILITOT 0.5 09/02/2015 0900        Lab Results  Component Value Date   WBC 9.0 10/17/2015   HGB 8.1 Repeated and verified X2. (L) 10/17/2015   HCT 26.6 Repeated and verified X2. (L) 10/17/2015   MCV 69.0 Repeated and verified X2. (L) 10/17/2015   PLT 234.0 10/17/2015        Lab Results  Component Value Date   TSH 1.27 09/02/2015     Lab Results  Component Value Date   PROBNP 1,192.0 (H) 05/14/2013            Assessment & Plan:

## 2015-11-25 NOTE — Assessment & Plan Note (Deleted)
Clearly multifactorial with copd/ anemia/ chf all playing a role here/ reviewed need to keep f/u appts with GI/cards

## 2015-11-25 NOTE — Patient Instructions (Addendum)
Pantoprazole (protonix) 40 mg   Take  30-60 min before first meal of the day and Pepcid (famotidine)  20 mg one @  bedtime until return to office - this is the best way to tell whether stomach acid is contributing to your problem.    Stiolto 2 pffs each am   Work on inhaler technique:  relax and gently blow all the way out then take a nice smooth deep breath back in, triggering the inhaler at same time you start breathing in.  Hold for up to 5 seconds if you can. Blow out thru nose. Rinse and gargle with water when done  Prednisone 10 mg take  4 each am x 2 days,   2 each am x 2 days,  1 each am x 2 days and stop    For cough mucinex dm up to 1200 mg every 12 hours   The key is to stop smoking completely before smoking completely stops you!  Please schedule a follow up office visit in 2 weeks, sooner if needed with all active medications in hand

## 2015-11-25 NOTE — Assessment & Plan Note (Signed)
PFT's   09/16/10   FEV1 2.60 (85 % ) ratio 61  p 14 % improvement from saba with DLCO  74 % corrects to 79  % for alv volume   - spirometry 11/20/2014   FEV1  1.92 (61%) ratio 61  - 10/17/2015   Try  symbicort 160 2bid ? Effective (pt does not recall) - Spirometry 11/25/2015  FEV1 1.55 (50%)  Ratio 53 p nothing prior - 11/25/2015  After extensive coaching HFA effectiveness =   75% try stiolto 2 each am    He has lost ground since last study and actively smoking/ clearly not adherent  Pt is Group B in terms of symptom/risk and laba/lama therefore appropriate rx at this point but may have enough AB to warrant at least short term rx with prednisone ie 6 days, and less likely to need it going forward if stops smoking now (see separate a/p)   I had an extended discussion with the patient reviewing all relevant studies completed to date and  lasting 15 to 20 minutes of a 25 minute visit    Each maintenance medication was reviewed in detail including most importantly the difference between maintenance and prns and under what circumstances the prns are to be triggered using an action plan format that is not reflected in the computer generated alphabetically organized AVS.    Please see instructions for details which were reviewed in writing and the patient given a copy highlighting the part that I personally wrote and discussed at today's ov.

## 2015-11-25 NOTE — Assessment & Plan Note (Signed)

## 2015-11-25 NOTE — Assessment & Plan Note (Signed)
Clearly multifactorial with copd/ anemia/ chf all playing a role here/ reviewed need to keep f/u appts with GI/cards

## 2015-12-01 ENCOUNTER — Other Ambulatory Visit (INDEPENDENT_AMBULATORY_CARE_PROVIDER_SITE_OTHER): Payer: Medicare Other

## 2015-12-01 ENCOUNTER — Encounter: Payer: Self-pay | Admitting: Physician Assistant

## 2015-12-01 ENCOUNTER — Ambulatory Visit (INDEPENDENT_AMBULATORY_CARE_PROVIDER_SITE_OTHER): Payer: Medicare Other | Admitting: Physician Assistant

## 2015-12-01 VITALS — BP 130/66 | HR 64 | Ht 68.5 in | Wt 203.2 lb

## 2015-12-01 DIAGNOSIS — D509 Iron deficiency anemia, unspecified: Secondary | ICD-10-CM

## 2015-12-01 DIAGNOSIS — R933 Abnormal findings on diagnostic imaging of other parts of digestive tract: Secondary | ICD-10-CM

## 2015-12-01 DIAGNOSIS — Z7901 Long term (current) use of anticoagulants: Secondary | ICD-10-CM | POA: Diagnosis not present

## 2015-12-01 DIAGNOSIS — K219 Gastro-esophageal reflux disease without esophagitis: Secondary | ICD-10-CM | POA: Diagnosis not present

## 2015-12-01 LAB — IBC PANEL
Iron: 33 ug/dL — ABNORMAL LOW (ref 42–165)
SATURATION RATIOS: 8.9 % — AB (ref 20.0–50.0)
TRANSFERRIN: 266 mg/dL (ref 212.0–360.0)

## 2015-12-01 LAB — FERRITIN: Ferritin: 19.8 ng/mL — ABNORMAL LOW (ref 22.0–322.0)

## 2015-12-01 LAB — HEMOGLOBIN: HEMOGLOBIN: 12.6 g/dL — AB (ref 13.0–17.0)

## 2015-12-01 NOTE — Progress Notes (Signed)
All results reviewed. Agree with assessment and plans. The patient should Hollow up with me in the office in about 3 months.

## 2015-12-01 NOTE — Patient Instructions (Signed)
  Your physician has requested that you go to the basement for the lab work before leaving today.    Please call and make an appointment to see Dr Henrene Pastor in 2-3 months.     I appreciate the opportunity to care for you.

## 2015-12-01 NOTE — Progress Notes (Signed)
Chief Complaint: IDA, GERD  HPI:  Travis [name] is a 75 year old Caucasian male with past medical history of recent stent placement 08/2015 for his CAD, he is maintained on Plavix and Xarelto, OSA, paroxysmal A. fib, hypertension and hyperlipidemia who was referred to our clinic originally for a complaint of anemia, GERD and constipation. Patient follows with Dr. Henrene Pastor.    He was last seen in clinic on 10/24/2015 by me, and at that time labs done on 10/17/2015 showed an iron low at 6 and percent saturation low at 1.3%, CBC showed hemoglobin of 8.1 down from 10.4 on month previously. At that time after discussion with Dr. Henrene Pastor it was thought that the patient should not come off of his anticoagulation as he had had a recent stent placement. It was recommended he have a virtual colonoscopy for further evaluation of possible mass and/or to confirm AVMs. He was also told to continue his Pantoprazole 40 mg twice a day. He was told to increase his iron supplement to 3 times daily. Occult studies were ordered and it was discussed that labs would be rechecked in 5-6 weeks including iron studies and a CBC.   Most recent CBC from 11/04/2015 shows a hemoglobin 10.1. Iron studies have not been rechecked. Virtual colonoscopy performed 11/17/2015 showed bilobed abdominal aortic aneurysm, maximally 4.2 cm, repeat Ultrasound was recommended in 1 year. Cholelithiasis, small bilateral inguinal hernias containing fat and umbilical hernia containing a decompressed small bowel loop as well as coronary artery disease. There were 2 non-tagged polypoid areas within the sigmoid colon one 7 mm polypoid area noted in the sigmoid colon approximately 30 cm from the anal verge as well as an elongated 11 mm polypoid filling defect noted in the sigmoid colon approximately 50 cm in anal verge.   Today, patient tells me that he feels much better. He tells me that he is less fatigued overall. He "does have his days", but overall feels much  better. He denies any constipation and has continued his iron 325 mg twice a day. He tells me that he never went to 3 times a day dosing. He also takes his Pantoprazole '40mg'$  once a day which is controlling his heartburn and reflux symptoms. He never increased this to twice daily.   He does make sure to tell me that his "virtual colonoscopy hurt a lot". He tells me he would never like to have another one.   Patient denies fever, chills, blood in his stool, melena, change in bowel habits, fatigue, anorexia, shortness of breath, dyspnea on exertion, dizziness, headaches, syncope, nausea, vomiting, heartburn, reflux or abdominal pain.   Past Medical History:  Diagnosis Date  . AAA family hx 11/20/2014  . CAD (coronary artery disease)    s/p RCA atherectomy 1994, cath 1999 with occluded RCA with left to right collaterals, PCI of LAD 2007, s/p PCI of LAD for instent thrombosis 2008, PCI 08/2015 with DES to LAD due to ISR with known L-R collaterals to RCA  . Dyslipidemia   . Hyperlipidemia   . Hypertension   . Insomnia   . Lung cancer [facility])    s/p lobectomy, chemo and radiation therapy  . OSA (obstructive sleep apnea)    intolerant to CPAP  . PAF (paroxysmal atrial fibrillation) ([place])   . Psoriasis   . Tobacco abuse     Past Surgical History:  Procedure Laterality Date  . APPENDECTOMY    . CARDIAC CATHETERIZATION  1999  . CARDIAC CATHETERIZATION N/A 08/22/2015   Procedure:  Left Heart Cath and Coronary Angiography;  Surgeon: Leonie Man, MD;  Location: [place] [facility];  Service: Cardiovascular;  Laterality: N/A;  . CARDIAC CATHETERIZATION N/A 08/22/2015   Procedure: Coronary Stent Intervention;  Surgeon: Leonie Man, MD;  Location: [place] [facility];  Service: Cardiovascular;  Laterality: N/A;  . EXPLORATORY LAPAROTOMY     secondary to trauma  . multiple PCT's to RCA and LAD  2007/2008  . s/p lung lobectomy  2007  . VIDEO BRONCHOSCOPY N/A 08/28/2012   Procedure: VIDEO BRONCHOSCOPY  WITHOUT FLUORO;  Surgeon: Elsie Stain, MD;  Location: [place];  Service: Cardiopulmonary;  Laterality: N/A;    Current Outpatient Prescriptions  Medication Sig Dispense Refill  . clopidogrel (PLAVIX) 75 MG tablet Take 1 tablet (75 mg total) by mouth daily with breakfast. 90 tablet 3  . famotidine (PEPCID) 20 MG tablet One at bedtime    . ferrous sulfate (FERROUSUL) 325 (65 FE) MG tablet Take 1 tablet (325 mg total) by mouth daily with breakfast. (Patient taking differently: Take 325 mg by mouth 2 (two) times daily. ) 90 tablet 2  . isosorbide mononitrate (IMDUR) 30 MG 24 hr tablet Take 1 tablet (30 mg total) by mouth daily. 90 tablet 3  . MULTAQ 400 MG tablet TAKE 1 TABLET BY MOUTH TWICE A DAY 30 60 tablet 10  . nitroGLYCERIN (NITROSTAT) 0.4 MG SL tablet Place 0.4 mg under the tongue every 5 (five) minutes as needed for chest pain. X 3 doses    . pantoprazole (PROTONIX) 40 MG tablet Take 1 tablet (40 mg total) by mouth daily. 90 tablet 3  . rosuvastatin (CRESTOR) 10 MG tablet Take 1 tablet (10 mg total) by mouth daily. 90 tablet 3  . Tiotropium Bromide Monohydrate (SPIRIVA RESPIMAT) 2.5 MCG/ACT AERS 2 pffs each am    . traZODone (DESYREL) 100 MG tablet Take 200 mg by mouth at bedtime.     Alveda Reasons 20 MG TABS tablet Take 1 tablet (20 mg total) by mouth daily. 90 tablet 3   No current facility-administered medications for this visit.     Allergies as of 12/01/2015  . (No Known Allergies)    Family History  Problem Relation Age of Onset  . Heart attack Father   . Heart disease Mother   . Heart attack Mother   . Coronary artery disease Brother   . Lymphoma Brother   . Breast cancer Sister   . Diabetes Sister   . Heart disease Sister   . Colon cancer Neg Hx   . Stomach cancer Neg Hx     Social History   Social History  . Marital status: Married    Spouse name: N/A  . Number of children: 3  . Years of education: N/A   Occupational History  . [employer]   Social History Main Topics  . Smoking status: Light Tobacco Smoker    Packs/day: 0.75    Years: 58.00    Types: Cigarettes    Last attempt to quit: 10/14/2015  . Smokeless tobacco: Former Systems developer    Types: Chew    Quit date: 07/04/2012     Comment: started smoking at age 51. ; Pt states that he is almost quit - smokes every now and again (11/25/15)  . Alcohol use No  . Drug use: No  . Sexual activity: Not on file   Other Topics Concern  . Not on file   Social History Narrative  .  No narrative on file    Review of Systems:     Constitutional: No weight loss, weakness or fatigue Respiratory: No SOB Gastrointestinal: See HPI and otherwise negative   Physical Exam:  Vital signs: BP 130/66 (BP Location: Left Arm, Patient Position: Sitting, Cuff Size: Normal)   Pulse 64   Ht 5' 8.5" (1.74 m) Comment: height measured without shoes  Wt 203 lb 4 oz (92.2 kg)   BMI 30.45 kg/m    Constitutional:   Pleasant Caucasian male appears to be in NAD, Well developed, Well nourished, alert and cooperative Respiratory: Respirations even and unlabored. Lungs clear to auscultation bilaterally.   No wheezes, crackles, or rhonchi.  Cardiovascular: Normal S1, S2. No MRG. Regular rate and rhythm. No peripheral edema, cyanosis or pallor.  Gastrointestinal:  Soft, nondistended, nontender. No rebound or guarding. Normal bowel sounds. No appreciable masses or hepatomegaly.  MOST RECENT LABS AND IMAGING: CBC    Component Value Date/Time   WBC 9.8 11/04/2015 1119   RBC 4.45 11/04/2015 1119   HGB 10.1 (L) 11/04/2015 1119   HGB 15.9 01/16/2009 1110   HCT 34.1 (L) 11/04/2015 1119   HCT 47.2 01/16/2009 1110   PLT 257 11/04/2015 1119   PLT 156 01/16/2009 1110   MCV 76.6 (L) 11/04/2015 1119   MCV 87.6 01/16/2009 1110   MCH 22.7 (L) 11/04/2015 1119   MCHC 29.6 (L) 11/04/2015 1119   RDW 23.1 (H) 11/04/2015 1119   RDW 14.0 01/16/2009 1110   LYMPHSABS 1.4 10/17/2015 0934   LYMPHSABS 2.2 01/16/2009  1110   MONOABS 0.9 10/17/2015 0934   MONOABS 0.7 01/16/2009 1110   EOSABS 0.3 10/17/2015 0934   EOSABS 0.2 01/16/2009 1110   BASOSABS 0.0 10/17/2015 0934   BASOSABS 0.0 01/16/2009 1110    CMP     Component Value Date/Time   NA 141 10/17/2015 0934   K 4.4 10/17/2015 0934   CL 106 10/17/2015 0934   CO2 29 10/17/2015 0934   GLUCOSE 111 (H) 10/17/2015 0934   BUN 10 10/17/2015 0934   CREATININE 1.07 10/17/2015 0934   CREATININE 1.09 09/02/2015 0900   CALCIUM 8.8 10/17/2015 0934   PROT 6.2 09/02/2015 0900   ALBUMIN 3.9 09/02/2015 0900   AST 12 09/02/2015 0900   ALT 9 09/02/2015 0900   ALKPHOS 62 09/02/2015 0900   BILITOT 0.5 09/02/2015 0900   GFRNONAA >60 08/23/2015 0359   GFRAA >60 08/23/2015 0359   EXAM: CT VIRTUAL COLONOSCOPY DIAGNOSTIC 11/17/15  TECHNIQUE: The patient was given a standard bowel preparation with Gastrografin and barium for fluid and stool tagging respectively. The quality of the bowel preparation is moderate. Automated CO2 insufflation of the colon was performed prior to image acquisition and colonic distention is excellent. Image post processing was used to generate a 3D endoluminal fly-through projection of the colon and to electronically subtract stool/fluid as appropriate.  COMPARISON:  None.  FINDINGS: 7 mm polypoid area noted in the sigmoid colon approximately 30 cm from the anal verge. This is not tagged with barium. The elongated 11 mm polypoid filling defect noted in the sigmoid colon approximately 50 cm from the anal verge. This demonstrates barium in tagging along the surface, but not internally. No other persistent non barium tagged polypoid filling defects. No annular constricting lesions.  IMPRESSION: Two non tagged polypoid areas within the sigmoid colon as described above. These are concerning for polypoid lesions. Recommend further evaluation with colonoscopy.  Virtual colonoscopy is not designed to detect diminutive  polyps (i.e.,  less than or equal to 5 mm), the presence or absence of which may not affect clinical management.  CT ABDOMEN AND PELVIS WITHOUT CONTRAST  FINDINGS:  Lower chest: Lung bases are clear. No effusions. Heart is normal size. Dense coronary artery calcifications in the visualize coronary arteries. Small hiatal hernia.  Hepatobiliary: Layering gallstones within the gallbladder. No visible focal abnormality in the liver.  Pancreas: No focal abnormality or ductal dilatation.  Spleen: No focal abnormality.  Normal size.  Adrenals/Urinary Tract: Small low-density area in the upper pole of the left kidney, likely small cysts. No hydronephrosis. Adrenal glands and urinary bladder grossly unremarkable.  Stomach/Bowel: Stomach and small bowel decompressed, grossly unremarkable.  Vascular/Lymphatic: Bilobed abdominal aortic aneurysm measuring up to 4.2 cm in the mid abdominal aorta and 3.9 cm in the distal abdominal aorta. Diffuse aortic and iliac calcifications. No adenopathy.  Reproductive: No visible focal abnormality.  Other: No free fluid or free air. Small bilateral inguinal hernias containing fat. Small umbilical hernia containing a decompressed small bowel loop.  Musculoskeletal: Degenerative changes in the lumbar spine. No acute bony abnormality or focal bone lesion.  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 Recommend followup by ultrasound in 1 year. This recommendation follows ACR consensus guidelines: White Paper of the ACR Incidental Findings Committee II on Vascular Findings. J Am Coll Radiol 2013; 10:789-794.  Cholelithiasis.  Small bilateral inguinal hernias containing fat and umbilical hernia containing a decompressed small bowel loop.  Coronary artery disease.   Electronically Signed   By: Rolm Baptise M.D.   On: 11/17/2015 09:08  Assessment: 1. IDA: Recent hemoglobin 10/31 shows a 2 point  improvement since time of his last office visit a month ago, patient with decrease in symptoms of fatigue, no longer having problems; would recommend patient have colonoscopy in the future for further evaluation of polypoid lesions seen at time of virtual colonoscopy, but this can wait until patient is able to come off of his blood thinner safely 2. Polypoid lesions: Seen at time of virtual colonoscopy 3. GERD: Controlled on Protonix '40mg'$  qd 4. Chronic Anticoagulation: Plavix and Xarelto-needs to be on 6 mos after recent stent in August 2017  Plan: 1. Recheck hemoglobin and iron studies today. Pending results, will make further recommendations about continued iron supplementation. 2. Did discuss results of patient's virtual colonoscopy including abdominal aortic aneurysm. Patient tells me he has follow up with a vascular surgeon regarding this. Did discuss that there were 2 polypoid lesions found, patient will need a colonoscopy in the future. Per cardiology they would like him to be on Plavix and Xarelto for at least 6 months after his last stent placement in August, earliest colonoscopy in February/March. 3. Patient should continue his Protonix 40 mg daily 4. Patient to follow with Dr. Henrene Pastor in 2-3 months. At that time he can discuss scheduling for colonoscopy.  Ellouise Newer, PA-C [facility] 12/01/2015, 1:45 PM  Cc: Corine Shelter, PA-C

## 2015-12-02 ENCOUNTER — Other Ambulatory Visit: Payer: Self-pay

## 2015-12-02 DIAGNOSIS — D509 Iron deficiency anemia, unspecified: Secondary | ICD-10-CM

## 2015-12-10 ENCOUNTER — Encounter: Payer: Self-pay | Admitting: Vascular Surgery

## 2015-12-12 ENCOUNTER — Encounter: Payer: Self-pay | Admitting: Internal Medicine

## 2015-12-16 ENCOUNTER — Ambulatory Visit (INDEPENDENT_AMBULATORY_CARE_PROVIDER_SITE_OTHER): Payer: Medicare Other | Admitting: Vascular Surgery

## 2015-12-16 ENCOUNTER — Encounter: Payer: Medicare Other | Admitting: Vascular Surgery

## 2015-12-16 ENCOUNTER — Other Ambulatory Visit (INDEPENDENT_AMBULATORY_CARE_PROVIDER_SITE_OTHER): Payer: Medicare Other

## 2015-12-16 ENCOUNTER — Encounter: Payer: Self-pay | Admitting: Vascular Surgery

## 2015-12-16 ENCOUNTER — Encounter: Payer: Self-pay | Admitting: Internal Medicine

## 2015-12-16 ENCOUNTER — Ambulatory Visit (INDEPENDENT_AMBULATORY_CARE_PROVIDER_SITE_OTHER): Payer: Medicare Other | Admitting: Internal Medicine

## 2015-12-16 VITALS — BP 150/81 | HR 68 | Temp 98.6°F | Resp 20 | Wt 204.0 lb

## 2015-12-16 VITALS — BP 122/80 | HR 70 | Ht 71.0 in | Wt 201.0 lb

## 2015-12-16 DIAGNOSIS — D509 Iron deficiency anemia, unspecified: Secondary | ICD-10-CM

## 2015-12-16 DIAGNOSIS — I714 Abdominal aortic aneurysm, without rupture, unspecified: Secondary | ICD-10-CM

## 2015-12-16 DIAGNOSIS — D508 Other iron deficiency anemias: Secondary | ICD-10-CM

## 2015-12-16 DIAGNOSIS — J449 Chronic obstructive pulmonary disease, unspecified: Secondary | ICD-10-CM | POA: Diagnosis not present

## 2015-12-16 LAB — IBC PANEL
Iron: 35 ug/dL — ABNORMAL LOW (ref 42–165)
SATURATION RATIOS: 7.9 % — AB (ref 20.0–50.0)
TRANSFERRIN: 318 mg/dL (ref 212.0–360.0)

## 2015-12-16 LAB — HEMOGLOBIN: Hemoglobin: 13.1 g/dL (ref 13.0–17.0)

## 2015-12-16 MED ORDER — TIOTROPIUM BROMIDE-OLODATEROL 2.5-2.5 MCG/ACT IN AERS: 2.0000 | INHALATION_SPRAY | Freq: Every day | RESPIRATORY_TRACT | 11 refills | Status: DC

## 2015-12-16 NOTE — Progress Notes (Signed)
[facility] [place]  Patient name: Travis [name] MRN: [id number] DOB: 1940/03/12 Sex: male  REASON FOR CONSULT: Evaluation of abdominal aortic aneurysm  HPI: Travis [name] is a 75 y.o. male, who is here today for discussion of recent diagnosis of abdominal aortic aneurysm. He was having anemia and underwent virtual colonoscopy CT scan. This showed a incidental finding of a 4.1 cm infrarenal abdominal aortic aneurysm. He had no prior knowledge of this. He underwent duplex following this again showed a 4.7 cm aneurysm. He is seen today for further discussion of this. He has several siblings with a history of aneurysm one dying with rupture. His past medical history as outlined below. He does have coronary disease and did have a prior lung cancer treated with lobectomy chemotherapy and radiation. He recently quit smoking  Past Medical History:  Diagnosis Date  . AAA family hx 11/20/2014  . CAD (coronary artery disease)    s/p RCA atherectomy 1994, cath 1999 with occluded RCA with left to right collaterals, PCI of LAD 2007, s/p PCI of LAD for instent thrombosis 2008, PCI 08/2015 with DES to LAD due to ISR with known L-R collaterals to RCA  . Dyslipidemia   . Hyperlipidemia   . Hypertension   . Insomnia   . Lung cancer [facility])    s/p lobectomy, chemo and radiation therapy  . OSA (obstructive sleep apnea)    intolerant to CPAP  . PAF (paroxysmal atrial fibrillation) ([place])   . Psoriasis   . Tobacco abuse     Family History  Problem Relation Age of Onset  . Heart attack Father   . Heart disease Mother   . Heart attack Mother   . Coronary artery disease Brother   . Lymphoma Brother   . Breast cancer Sister   . Diabetes Sister   . Heart disease Sister   . Colon cancer Neg Hx   . Stomach cancer Neg Hx     SOCIAL HISTORY: Social History   Social History  . Marital status: Married    Spouse name: N/A  . Number of  children: 3  . Years of education: N/A   Occupational History  . [employer]   Social History Main Topics  . Smoking status: Former Smoker    Packs/day: 0.75    Years: 58.00    Types: Cigarettes    Quit date: 11/22/2015  . Smokeless tobacco: Former Systems developer    Types: Chew    Quit date: 07/04/2012     Comment: started smoking at age 83. ; Pt states that he is almost quit - smokes every now and again (11/25/15)  . Alcohol use No  . Drug use: No  . Sexual activity: Not on file   Other Topics Concern  . Not on file   Social History Narrative  . No narrative on file    No Known Allergies  Current Outpatient Prescriptions  Medication Sig Dispense Refill  . clopidogrel (PLAVIX) 75 MG tablet Take 1 tablet (75 mg total) by mouth daily with breakfast. 90 tablet 3  . famotidine (PEPCID) 20 MG tablet One at bedtime    . isosorbide mononitrate (IMDUR) 30 MG 24 hr tablet Take 1 tablet (30 mg total) by mouth daily. 90 tablet 3  . MULTAQ 400 MG tablet TAKE 1 TABLET BY MOUTH TWICE A DAY 30 60 tablet 10  . nitroGLYCERIN (NITROSTAT) 0.4 MG SL tablet Place 0.4 mg under the tongue every  5 (five) minutes as needed for chest pain. X 3 doses    . pantoprazole (PROTONIX) 40 MG tablet Take 1 tablet (40 mg total) by mouth daily. 90 tablet 3  . rosuvastatin (CRESTOR) 10 MG tablet Take 1 tablet (10 mg total) by mouth daily. 90 tablet 3  . Tiotropium Bromide-Olodaterol (STIOLTO RESPIMAT) 2.5-2.5 MCG/ACT AERS Inhale 2 puffs into the lungs daily. 1 Inhaler 11  . traZODone (DESYREL) 100 MG tablet Take 200 mg by mouth at bedtime.     Alveda Reasons 20 MG TABS tablet Take 1 tablet (20 mg total) by mouth daily. 90 tablet 3   No current facility-administered medications for this visit.     REVIEW OF SYSTEMS:  '[X]'$  denotes positive finding, '[ ]'$  denotes negative finding Cardiac  Comments:  Chest pain or chest pressure:    Shortness of breath upon exertion: x   Short of breath when lying flat:      Irregular heart rhythm: x       Vascular    Pain in calf, thigh, or hip brought on by ambulation:    Pain in feet at night that wakes you up from your sleep:     Blood clot in your veins:    Leg swelling:         Pulmonary    Oxygen at home:    Productive cough:  x   Wheezing:  x       Neurologic    Sudden weakness in arms or legs:     Sudden numbness in arms or legs:     Sudden onset of difficulty speaking or slurred speech:    Temporary loss of vision in one eye:     Problems with dizziness:         Gastrointestinal    Blood in stool:     Vomited blood:         Genitourinary    Burning when urinating:     Blood in urine:        Psychiatric    Major depression:         Hematologic    Bleeding problems:    Problems with blood clotting too easily:        Skin    Rashes or ulcers:        Constitutional    Fever or chills:      PHYSICAL EXAM: Vitals:   12/16/15 1405  BP: (!) 150/81  Pulse: 68  Resp: 20  Temp: 98.6 F (37 C)  SpO2: 95%  Weight: 204 lb (92.5 kg)    GENERAL: The patient is a well-nourished male, in no acute distress. The vital signs are documented above. CARDIOVASCULAR: 2+ radial 2+ femoral 2+ popliteal and posterior tibial pulses bilaterally. Carotid arteries without bruits bilaterally PULMONARY: There is good air exchange  ABDOMEN: Soft and non-tender . Prominent aortic pulsation with no tenderness over the aorta MUSCULOSKELETAL: There are no major deformities or cyanosis. NEUROLOGIC: No focal weakness or paresthesias are detected. SKIN: There are no ulcers or rashes noted. PSYCHIATRIC: The patient has a normal affect.  DATA:  Reviewed his CT images. This does show a bilobed aorta with some extension into the iliacs. Does appear to have adequate infrarenal neck for stent graft Inc. if size would indicate repair  MEDICAL ISSUES: Discussed this at length with the patient. His wife is present as well. Explain symptoms of leaking aneurysm  he knows to report immediately to the emergency room should this occur.  Explained this is quite unlikely with his current aneurysm size. Explained there would be no restrictions other than typical blood pressure control. I did explain open and stent graft repair of aneurysm should he have increased size. Plan to see him again in 6 months with repeat ultrasound   Rosetta Posner, MD [facility] Tel [phone] Pager [phone]

## 2015-12-16 NOTE — Progress Notes (Signed)
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  Recent admit for RLL CAP , COPD exacerbation .  Tx w/ IV abx,  And discharged on omnicef .  Stay compliacted by Atrial fib with RVR. Improved with cardizem drip. Cards consult with plan to cont mutltaq and pradaxa.  Pt reports is 85% improved since discharge. Still having some lingering wheezing.   Denies any increased SOB, tightness, f/c/s, hemoptysis, nausea, vomiting.  finished abx this morning.  hasn't smoked since 5/10-encouraged on smoking cessation.  On ACE i   rec Continue on current regimen.  Take Spiriva daily  Great job on not smoking .  Mucinex DM Twice daily  As needed  Cough/congestion  Please contact office for sooner follow up if symptoms do not improve or worsen or seek emergency care     11/20/2014  Consultation/Wert re: active smoker/ GOLD II copd  Not on active meds  Chief Complaint  Patient presents with  . Pulmonary Consult    Former patient of Dr Joya Gaskins. Pt needing DOT clearance. Pt states that his breathing is doing well. He denies any co's today.    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  Stop  lisinopril and start ibesartan 75 mg daily - ok to break in half if too strong and just take a half pill daily      10/17/2015 acute extended ov/Wert re:  GOLD II CPD no maint rx   Chief Complaint  Patient presents with  . Follow-up    Pt states having increased DOE x 3-4 months. He states he gets SOB with any exertion at all, esp walking an incline. He has some chest tightness on exertion. He has occ cough with clear sputum.    unsure of meds/ took ? One pfff symb this am with some relief, sleeping fine on cpap   Indolent onset/progressive x 3-4 m Doe = MMRC3 = can't walk 100 yards even at a slow pace at a flat grade s stopping due to sob   rec symbicort 160 Take 2 puffs first thing in am and then another 2 puffs about 12 hours later.  Pantoprazole (protonix) 40 mg   Take  30-60 min before first meal of the day and Pepcid (famotidine)  20 mg one @  bedtime until return to office - this is the best way to tell whether stomach acid is contributing to your problem.   GERD  11/25/2015  f/u ov/Wert re: copd II/ copd still smoking/ really confused with details of care  Chief Complaint  Patient presents with  . Follow-up    4wk rov - Pt c/o cough x 1 week. Thick mucus production, white in color. Denies CP/chest tightness and wheezing. Using Robitussin OTC for cough and Mucinex DM.  Little relief   again unsure of meds, not clear whether even ever used symb samples/ did not fill rx Cough is worst first thing in am but even then just white,  No purulent sputum or mucus plugs    Doe continues = MMRC3  rec Pantoprazole (protonix) 40 mg   Take  30-60 min before first meal of the day and Pepcid (famotidine)  20 mg one @  bedtime until return to office - this is the best way to tell whether stomach acid is contributing to your problem.   Stiolto 2 pffs each am  Work on inhaler technique: Prednisone 10 mg take  4 each am x 2 days,   2 each am x 2 days,  1 each am x 2 days and stop  For  cough mucinex dm up to 1200 mg every 12 hours  The key is to stop smoking completely before smoking completely stops you! Please schedule a follow up office visit in 2 weeks, sooner if needed with all active medications in hand   12/16/2015  f/u ov/Wert re: copd II/ quit  Smoking since last ov / stiolto daily  Chief Complaint  Patient presents with  . Follow-up    Cough has slightly improved. Cough is occ prod in the am's with clear sputum.  His breathing has improved.   doe =  MMRC3  Minimal am mucus / clears easily s noct disturbance Thinks overall better on stiolto/ no striking resp to pred  "took all my iron" no clear plan for f/u per pt (see last GI note which suggests otherwise)   No obvious day to day or daytime variability or assoc excess/ purulent sputum or mucus plugs   cp or overt sinus or hb symptoms. No unusual exp hx or h/o childhood pna/ asthma or knowledge of premature birth.  Sleeping ok without nocturnal  or early am exacerbation  of respiratory  c/o's or need for noct saba. Also denies any obvious fluctuation of symptoms with weather or environmental changes or other aggravating or alleviating factors except as outlined above   Current Medications, Allergies, Complete Past Medical History, Past Surgical History, Family History, and Social History were reviewed in [employer] record.  ROS  The following are not active complaints unless bolded sore throat, dysphagia, dental problems, itching, sneezing,  nasal congestion or excess/ purulent secretions, ear ache,   fever, chills, sweats, unintended wt loss, classically pleuritic or exertional cp, hemoptysis,  orthopnea pnd or leg swelling, presyncope, palpitations, abdominal pain, anorexia, nausea, vomiting, diarrhea  or change in bowel or bladder habits, change in stools or urine, dysuria,hematuria,  rash, arthralgias, visual complaints, headache, numbness, weakness or ataxia or problems with walking or  coordination,  change in mood/affect or memory.         Objective:   Physical Exam   GEN: A/Ox3; pleasant , NAD, elderly wm    12/16/2015      201  11/25/2015      190  10/17/2015     205  11/20/14 201 lb (91.173 kg)  11/20/14 200 lb (90.719 kg)  04/29/14 207 lb (93.895 kg)  Vital signs reviewed -  - Note on arrival 02 sats  97% on RA      HEENT:  [place]/AT,  EACs-clear, TMs-wnl, NOSE-clear drainage  THROAT-clear, no lesions, no postnasal drip or exudate noted.   NECK:  Supple w/ fair ROM; no JVD; normal carotid impulses w/o bruits; no thyromegaly or nodules palpated; no lymphadenopathy.    RESP  Diminished BS in bases ,  Very minimal late exp rhonchi bilaterally   CARD:  RRR, no m/r/g  , no peripheral edema, pulses intact, no cyanosis or clubbing.  GI:  Mod obese/  Soft & nt; nml bowel sounds; no organomegaly or masses detected. / pos hoover's at end insp   Musco: Warm bil, no deformities or joint swelling noted.   Neuro: alert, no focal deficits noted.    Skin: Warm, no lesions or rashes      CXR PA and Lateral:   10/17/2015 :    I personally reviewed images and agree with radiology impression as follows:    No active cardiopulmonary disease. No evidence of pneumonia. Stable postsurgical changes.       Assessment & Plan:   Outpatient Encounter Prescriptions as of 12/16/2015  Medication Sig  . clopidogrel (PLAVIX) 75 MG tablet Take 1 tablet (75 mg total) by mouth daily with breakfast.  . famotidine (PEPCID) 20 MG tablet One at bedtime  . isosorbide mononitrate (IMDUR) 30 MG 24 hr tablet Take 1 tablet (30 mg total) by mouth daily.  . MULTAQ 400 MG tablet TAKE 1 TABLET BY MOUTH TWICE A DAY 30  . nitroGLYCERIN (NITROSTAT) 0.4 MG SL tablet Place 0.4 mg under the tongue every 5 (five) minutes as needed for chest pain. X 3 doses  . pantoprazole (PROTONIX) 40 MG tablet Take 1 tablet (40 mg total) by mouth daily.  . rosuvastatin (CRESTOR) 10 MG tablet Take 1 tablet (10  mg total) by mouth daily.  . traZODone (DESYREL) 100 MG tablet Take 200 mg by mouth at bedtime.   Alveda Reasons 20 MG TABS tablet Take 1 tablet (20 mg total) by mouth daily.  . [DISCONTINUED] Tiotropium Bromide Monohydrate (SPIRIVA RESPIMAT) 2.5 MCG/ACT AERS 2 pffs each am  . Tiotropium Bromide-Olodaterol (STIOLTO RESPIMAT) 2.5-2.5 MCG/ACT AERS Inhale 2 puffs into the lungs daily.  . [DISCONTINUED] ferrous sulfate (FERROUSUL) 325 (65 FE) MG tablet Take 1 tablet (325 mg total) by mouth daily with breakfast. (Patient taking differently: Take 325 mg by mouth 2 (two) times daily. )   No facility-administered encounter medications on file as of 12/16/2015.

## 2015-12-16 NOTE — Patient Instructions (Addendum)
Please remember to go to the lab  department downstairs for your tests -  Your Iron and Iron deficiency needs to continue to be followed in the [place] 2 each am and call for follow up as needed if not happy with your breathing    If you are satisfied with your treatment plan,  let your doctor know and he/she can either refill your medications or you can return here when your prescription runs out.     If in any way you are not 100% satisfied,  please tell us.  If 100% better, tell your friends!  Pulmonary follow up is as needed

## 2015-12-17 ENCOUNTER — Encounter: Payer: Self-pay | Admitting: Internal Medicine

## 2015-12-17 NOTE — Assessment & Plan Note (Addendum)
PFT's   09/16/10   FEV1 2.60 (85 % ) ratio 61  p 14 % improvement from saba with DLCO  74 % corrects to 79  % for alv volume   - spirometry 11/20/2014   FEV1  1.92 (61%) ratio 61  - 10/17/2015   Try  symbicort 160 2bid ? Effective (pt does not recall) - Spirometry 11/25/2015  FEV1 1.55 (50%)  Ratio 53 p nothing prior - 11/25/2015    try stiolto 2 each am - 12/16/2015  After extensive coaching HFA effectiveness =    75% with respimat     He has improved since he stopped smoking and started Stiolto. Although his technique is not perfect it is adequate to continue Stiolto as long as his insurance will pay for it.  I had an extended discussion with the patient reviewing all relevant studies completed to date and  lasting 15 to 20 minutes of a 25 minute visit on the following ongoing concerns:   Formulary restrictions will be an ongoing challenge for the forseable future and I would be happy to pick an alternative if the pt will first  provide me a list of them but pt  will need to return here for training for any new device that is required eg dpi vs hfa vs respimat.    In meantime we can always provide samples so the patient never runs out of any needed respiratory medications.   Emphasized staying off cigs more impt than f/u with me and he has plenty of sources of care at this point so we will see him in the pulmonary clinic on an as-needed basis.  Each maintenance medication was reviewed in detail including most importantly the difference between maintenance and as needed and under what circumstances the prns are to be used.  Please see AVS for  customized  Instructions which were reviewed in detail in writing with the patient and a copy provided.

## 2015-12-17 NOTE — Assessment & Plan Note (Signed)
Iron sats 1.3% 10/17/2015 > started on fes04 x one m supply  and seen by GI 12/01/15     Lab Results  Component Value Date   HGB 13.1 12/16/2015   HGB 12.6 (L) 12/01/2015   HGB 10.1 (L) 11/04/2015   HGB 15.9 01/16/2009   HGB 16.5 01/03/2008   HGB 15.6 07/13/2007    Clearly improving > defer further Fe rx and w/u to GI

## 2015-12-23 NOTE — Addendum Note (Signed)
Addended by: Lianne Cure A on: 12/23/2015 09:17 AM   Modules accepted: Orders

## 2016-02-04 ENCOUNTER — Ambulatory Visit: Payer: Medicare Other | Admitting: Internal Medicine

## 2016-02-16 ENCOUNTER — Encounter (HOSPITAL_COMMUNITY): Payer: Self-pay | Admitting: Physician Assistant

## 2016-02-16 NOTE — Progress Notes (Signed)
Mailed ltr with [facility] to pt... KJ

## 2016-02-25 ENCOUNTER — Ambulatory Visit (INDEPENDENT_AMBULATORY_CARE_PROVIDER_SITE_OTHER): Payer: Medicare Other | Admitting: Internal Medicine

## 2016-02-25 ENCOUNTER — Ambulatory Visit (INDEPENDENT_AMBULATORY_CARE_PROVIDER_SITE_OTHER)
Admission: RE | Admit: 2016-02-25 | Discharge: 2016-02-25 | Disposition: A | Discharge status: Home / Self Care | Payer: Medicare Other | Source: Ambulatory Visit | Attending: Internal Medicine | Admitting: Internal Medicine

## 2016-02-25 ENCOUNTER — Ambulatory Visit: Payer: Medicare Other | Admitting: Internal Medicine

## 2016-02-25 ENCOUNTER — Encounter: Payer: Self-pay | Admitting: Internal Medicine

## 2016-02-25 ENCOUNTER — Other Ambulatory Visit (INDEPENDENT_AMBULATORY_CARE_PROVIDER_SITE_OTHER): Payer: Medicare Other

## 2016-02-25 VITALS — BP 110/66 | HR 77 | Temp 98.2°F | Ht 71.0 in | Wt 202.0 lb

## 2016-02-25 DIAGNOSIS — J449 Chronic obstructive pulmonary disease, unspecified: Secondary | ICD-10-CM | POA: Diagnosis not present

## 2016-02-25 DIAGNOSIS — F17210 Nicotine dependence, cigarettes, uncomplicated: Secondary | ICD-10-CM

## 2016-02-25 DIAGNOSIS — R0602 Shortness of breath: Secondary | ICD-10-CM

## 2016-02-25 LAB — BASIC METABOLIC PANEL
BUN: 18 mg/dL (ref 6–23)
CHLORIDE: 104 meq/L (ref 96–112)
CO2: 30 mEq/L (ref 19–32)
CREATININE: 1.08 mg/dL (ref 0.40–1.50)
Calcium: 9.5 mg/dL (ref 8.4–10.5)
GFR: 70.34 mL/min (ref >60.00)
GLUCOSE: 142 mg/dL — AB (ref 70–99)
Potassium: 4.7 mEq/L (ref 3.5–5.1)
Sodium: 137 mEq/L (ref 135–145)

## 2016-02-25 LAB — CBC WITH DIFFERENTIAL/PLATELET
Basophils Absolute: 0.1 10*3/uL (ref 0.0–0.1)
Basophils Relative: 0.7 % (ref 0.0–3.0)
EOS PCT: 2.1 % (ref 0.0–5.0)
Eosinophils Absolute: 0.3 10*3/uL (ref 0.0–0.7)
HCT: 44.3 % (ref 39.0–52.0)
Hemoglobin: 14 g/dL (ref 13.0–17.0)
LYMPHS ABS: 2.3 10*3/uL (ref 0.7–4.0)
Lymphocytes Relative: 17.4 % (ref 12.0–46.0)
MCHC: 31.5 g/dL (ref 30.0–36.0)
MCV: 78.9 fl (ref 78.0–100.0)
MONO ABS: 1.2 10*3/uL — AB (ref 0.1–1.0)
Monocytes Relative: 9.1 % (ref 3.0–12.0)
NEUTROS ABS: 9.2 10*3/uL — AB (ref 1.4–7.7)
NEUTROS PCT: 70.7 % (ref 43.0–77.0)
PLATELETS: 219 10*3/uL (ref 150.0–400.0)
RBC: 5.61 Mil/uL (ref 4.22–5.81)
RDW: 16.8 % — AB (ref 11.5–15.5)
WBC: 13 10*3/uL — ABNORMAL HIGH (ref 4.0–10.5)

## 2016-02-25 LAB — TSH: TSH: 0.84 u[IU]/mL (ref 0.35–4.50)

## 2016-02-25 LAB — BRAIN NATRIURETIC PEPTIDE: PRO B NATRI PEPTIDE: 114 pg/mL — AB (ref 0.0–100.0)

## 2016-02-25 MED ORDER — FLUTTER DEVI: 0 refills | Status: DC

## 2016-02-25 MED ORDER — TIOTROPIUM BROMIDE-OLODATEROL 2.5-2.5 MCG/ACT IN AERS: 2.0000 | INHALATION_SPRAY | Freq: Every day | RESPIRATORY_TRACT | 0 refills | Status: AC

## 2016-02-25 NOTE — Patient Instructions (Addendum)
Pantoprazole (protonix) 40 mg   Take  30-60 min before first meal of the day and Pepcid (famotidine)  20 mg one @  bedtime until return to office - this is the best way to tell whether stomach acid is contributing to your problem.    Continue stiolto 2 pffs first thing am each am   Work on inhaler technique:  relax and gently blow all the way out then take a nice smooth deep breath back in, triggering the inhaler at same time you start breathing in.  Hold for up to 5 seconds if you can. Blow out thru nose. Rinse and gargle with water when done.   Only use your albuterol as a rescue medication to be used if you can't catch your breath by resting or doing a relaxed purse lip breathing pattern.  - The less you use it, the better it will work when you need it. - Ok to use up to 2 puffs  every 4 hours if you must but call for immediate appointment if use goes up over your usual need - Don't leave home without it !!  (think of it like the spare tire for your car)     For cough mucinex dm up to 1200 mg every 12 hours and use the flutter valve as much as possible   The key is to stop smoking completely before smoking completely stops you!   Please remember to go to the lab and x-ray department downstairs in the basement  for your tests - we will call you with the results when they are available.     Please schedule a follow up office visit in 2 weeks, sooner if needed  with all medications /inhalers/devices solutions in hand so we can verify exactly what you are taking. This includes all medications from all doctors and over the counters

## 2016-02-25 NOTE — Progress Notes (Signed)
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  Recent admit for RLL CAP , COPD exacerbation .  Tx w/ IV abx,  And discharged on omnicef .  Stay compliacted by Atrial fib with RVR. Improved with cardizem drip. Cards consult with plan to cont mutltaq and pradaxa.  Pt reports is 85% improved since discharge. Still having some lingering wheezing.   Denies any increased SOB, tightness, f/c/s, hemoptysis, nausea, vomiting.  finished abx this morning.  hasn't smoked since 5/10-encouraged on smoking cessation.  On ACE i   rec Continue on current regimen.  Take Spiriva daily  Great job on not smoking .  Mucinex DM Twice daily  As needed  Cough/congestion  Please contact office for sooner follow up if symptoms do not improve or worsen or seek emergency care     11/20/2014  Consultation/Wert re: active smoker/ GOLD II copd  Not on active meds  Chief Complaint  Patient presents with  . Pulmonary Consult    Former patient of Dr Joya Gaskins. Pt needing DOT clearance. Pt states that his breathing is doing well. He denies any co's today.    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  Stop  lisinopril and start ibesartan 75 mg daily - ok to break in half if too strong and just take a half pill daily      10/17/2015 acute extended ov/Wert re:  GOLD II CPD no maint rx   Chief Complaint  Patient presents with  . Follow-up    Pt states having increased DOE x 3-4 months. He states he gets SOB with any exertion at all, esp walking an incline. He has some chest tightness on exertion. He has occ cough with clear sputum.    unsure of meds/ took ? One pfff symb this am with some relief, sleeping fine on cpap   Indolent onset/progressive x 3-4 m Doe = MMRC3 = can't walk 100 yards even at a slow pace at a flat grade s stopping due to sob   rec symbicort 160 Take 2 puffs first thing in am and then another 2 puffs about 12 hours later.  Pantoprazole (protonix) 40 mg   Take  30-60 min before first meal of the day and Pepcid (famotidine)  20 mg one @  bedtime until return to office - this is the best way to tell whether stomach acid is contributing to your problem.   GERD  11/25/2015  f/u ov/Wert re: copd II/ copd still smoking/ really confused with details of care  Chief Complaint  Patient presents with  . Follow-up    4wk rov - Pt c/o cough x 1 week. Thick mucus production, white in color. Denies CP/chest tightness and wheezing. Using Robitussin OTC for cough and Mucinex DM.  Little relief   again unsure of meds, not clear whether even ever used symb samples/ did not fill rx Cough is worst first thing in am but even then just white,  No purulent sputum or mucus plugs    Doe continues = MMRC3  rec Pantoprazole (protonix) 40 mg   Take  30-60 min before first meal of the day and Pepcid (famotidine)  20 mg one @  bedtime until return to office - this is the best way to tell whether stomach acid is contributing to your problem.   Stiolto 2 pffs each am  Work on inhaler technique: Prednisone 10 mg take  4 each am x 2 days,   2 each am x 2 days,  1 each am x 2 days and stop  For  cough mucinex dm up to 1200 mg every 12 hours  The key is to stop smoking completely before smoking completely stops you! Please schedule a follow up office visit in 2 weeks, sooner if needed with all active medications in hand   12/16/2015  f/u ov/Wert re: copd II/ quit  Smoking since last ov / stiolto daily  Chief Complaint  Patient presents with  . Follow-up    Cough has slightly improved. Cough is occ prod in the am's with clear sputum.  His breathing has improved.   doe =  MMRC3  Minimal am mucus / clears easily s noct disturbance Thinks overall better on stiolto/ no striking resp to pred  "took all my iron" no clear plan for f/u per pt (see last GI note which suggests otherwise)  rec Please remember to go to the lab  department downstairs for your tests -  Your Iron and Iron deficiency needs to continue to be followed in the [place] 2 each am and call for follow up as needed if not happy with your breathing     02/25/2016  f/u ov/Wert re:  GOLD II copd/ still smoking  Chief Complaint  Patient presents with  . Acute Visit    Pt c/o increased SOB and cough over the past month. Cough is prod with thick, clear sputum.  He was seen by PCP 2 wks ago and was txed with Amoxicillin and Pred and this gave no relief. He also c/o wheezing.   cough is worse in shower  cpap hs works fine  MMRC3 = can't walk 100 yards even at a slow pace at a flat grade s stopping due to sob   Last time could do HT walking  was Fall  2017   No obvious day to day or daytime variability or assoc    mucus plugs or hemoptysis or cp or chest tightness, subjective wheeze or overt sinus or hb symptoms. No unusual exp hx or h/o childhood pna/ asthma or knowledge of premature birth.  Sleeping ok without nocturnal  or early am exacerbation  of respiratory  c/o's or need for noct saba. Also denies any obvious fluctuation of symptoms with weather or environmental changes or other aggravating or  alleviating factors except as outlined above   Current Medications, Allergies, Complete Past Medical History, Past Surgical History,  Family History, and Social History were reviewed in [employer] record.  ROS  The following are not active complaints unless bolded sore throat, dysphagia, dental problems, itching, sneezing,  nasal congestion or excess/ purulent secretions, ear ache,   fever, chills, sweats, unintended wt loss, classically pleuritic or exertional cp,  orthopnea pnd or leg swelling, presyncope, palpitations, abdominal pain, anorexia, nausea, vomiting, diarrhea  or change in bowel or bladder habits, change in stools or urine, dysuria,hematuria,  rash, arthralgias, visual complaints, headache, numbness, weakness or ataxia or problems with walking or coordination,  change in mood/affect or memory.               Objective:   Physical Exam   GEN: A/Ox3;   NAD, elderly wm  Who appears chronically ill .bmi  02/25/2016        202  12/16/2015      201  11/25/2015      190  10/17/2015     205  11/20/14 201 lb (91.173 kg)  11/20/14 200 lb (90.719 kg)  04/29/14 207 lb (93.895 kg)    Vital signs reviewed -  - Note on arrival 02 sats  97% on RA      HEENT:  [place]/AT,  EACs-clear, TMs-wnl, NOSE-clear drainage  THROAT-clear, no lesions, no postnasal drip or exudate noted.   NECK:  Supple w/ fair ROM; no JVD; normal carotid impulses w/o bruits; no thyromegaly or nodules palpated; no lymphadenopathy.    RESP  Diminished BS in bases ,  Very minimal late exp rhonchi bilaterally   CARD:  RRR, no m/r/g  , no peripheral edema, pulses intact, no cyanosis or clubbing.  GI:  Mod obese/  Soft & nt; nml bowel sounds; no organomegaly or masses detected. / pos hoover's at end insp   Musco: Warm bil, no deformities or joint swelling noted.   Neuro: alert, no focal deficits noted.    Skin: Warm, no lesions or rashes      CXR PA and Lateral:   02/25/2016 :    I  personally reviewed images and agree with radiology impression as follows:    Increasing density in the left mid lung in the region of previous surgery and radiation. This raises concern. Whereas this could represent benign post treatment scarring, recurrent tumor is not excluded at this point.       Labs ordered/ reviewed:      Chemistry      Component Value Date/Time   NA 137 02/25/2016 1705   K 4.7 02/25/2016 1705   CL 104 02/25/2016 1705   CO2 30 02/25/2016 1705   BUN 18 02/25/2016 1705   CREATININE 1.08 02/25/2016 1705   CREATININE 1.09 09/02/2015 0900      Component Value Date/Time   CALCIUM 9.5 02/25/2016 1705   ALKPHOS 62 09/02/2015 0900   AST 12 09/02/2015 0900   ALT 9 09/02/2015 0900   BILITOT 0.5 09/02/2015 0900        Lab Results  Component Value Date   WBC 13.0 (H) 02/25/2016   HGB 14.0 02/25/2016   HCT 44.3 02/25/2016   MCV 78.9 02/25/2016   PLT 219.0 02/25/2016        Lab Results  Component Value Date   TSH 0.84 02/25/2016     Lab Results  Component Value Date   PROBNP 114.0 (H) 02/25/2016          Assessment & Plan:

## 2016-02-26 ENCOUNTER — Encounter (HOSPITAL_COMMUNITY): Payer: Self-pay

## 2016-02-26 ENCOUNTER — Ambulatory Visit (INDEPENDENT_AMBULATORY_CARE_PROVIDER_SITE_OTHER)
Admission: RE | Admit: 2016-02-26 | Discharge: 2016-02-26 | Disposition: A | Discharge status: Home / Self Care | Payer: Medicare Other | Source: Ambulatory Visit | Attending: Internal Medicine | Admitting: Internal Medicine

## 2016-02-26 ENCOUNTER — Emergency Department (HOSPITAL_COMMUNITY)
Admission: EM | Admit: 2016-02-26 | Discharge: 2016-02-26 | Disposition: A | Discharge status: Home / Self Care | Payer: Medicare Other | Attending: Emergency Medicine | Admitting: Emergency Medicine

## 2016-02-26 ENCOUNTER — Other Ambulatory Visit: Payer: Self-pay | Admitting: Internal Medicine

## 2016-02-26 ENCOUNTER — Telehealth: Payer: Self-pay | Admitting: Internal Medicine

## 2016-02-26 DIAGNOSIS — F17210 Nicotine dependence, cigarettes, uncomplicated: Secondary | ICD-10-CM | POA: Diagnosis not present

## 2016-02-26 DIAGNOSIS — I10 Essential (primary) hypertension: Secondary | ICD-10-CM | POA: Insufficient documentation

## 2016-02-26 DIAGNOSIS — R918 Other nonspecific abnormal finding of lung field: Secondary | ICD-10-CM

## 2016-02-26 DIAGNOSIS — R0602 Shortness of breath: Secondary | ICD-10-CM

## 2016-02-26 DIAGNOSIS — Z79899 Other long term (current) drug therapy: Secondary | ICD-10-CM | POA: Insufficient documentation

## 2016-02-26 DIAGNOSIS — J441 Chronic obstructive pulmonary disease with (acute) exacerbation: Secondary | ICD-10-CM | POA: Diagnosis not present

## 2016-02-26 DIAGNOSIS — R059 Cough, unspecified: Secondary | ICD-10-CM | POA: Insufficient documentation

## 2016-02-26 DIAGNOSIS — I2699 Other pulmonary embolism without acute cor pulmonale: Secondary | ICD-10-CM

## 2016-02-26 DIAGNOSIS — Z7902 Long term (current) use of antithrombotics/antiplatelets: Secondary | ICD-10-CM | POA: Insufficient documentation

## 2016-02-26 DIAGNOSIS — Z85118 Personal history of other malignant neoplasm of bronchus and lung: Secondary | ICD-10-CM | POA: Insufficient documentation

## 2016-02-26 DIAGNOSIS — I2510 Atherosclerotic heart disease of native coronary artery without angina pectoris: Secondary | ICD-10-CM | POA: Insufficient documentation

## 2016-02-26 DIAGNOSIS — R05 Cough: Secondary | ICD-10-CM

## 2016-02-26 LAB — BASIC METABOLIC PANEL
ANION GAP: 8 (ref 5–15)
BUN: 13 mg/dL (ref 6–20)
CALCIUM: 9 mg/dL (ref 8.9–10.3)
CO2: 24 mmol/L (ref 22–32)
Chloride: 105 mmol/L (ref 101–111)
Creatinine, Ser: 1.09 mg/dL (ref 0.61–1.24)
GFR calc non Af Amer: >60 mL/min (ref >60)
Glucose, Bld: 120 mg/dL — ABNORMAL HIGH (ref 65–99)
POTASSIUM: 4.4 mmol/L (ref 3.5–5.1)
Sodium: 137 mmol/L (ref 135–145)

## 2016-02-26 LAB — CBC
HEMATOCRIT: 43 % (ref 39.0–52.0)
HEMOGLOBIN: 13.4 g/dL (ref 13.0–17.0)
MCH: 25.2 pg — ABNORMAL LOW (ref 26.0–34.0)
MCHC: 31.2 g/dL (ref 30.0–36.0)
MCV: 81 fL (ref 78.0–100.0)
Platelets: 198 10*3/uL (ref 150–400)
RBC: 5.31 MIL/uL (ref 4.22–5.81)
RDW: 15.7 % — ABNORMAL HIGH (ref 11.5–15.5)
WBC: 11.4 10*3/uL — ABNORMAL HIGH (ref 4.0–10.5)

## 2016-02-26 MED ORDER — ENOXAPARIN SODIUM 150 MG/ML ~~LOC~~ SOLN: 1.0000 mg/kg | Freq: Two times a day (BID) | SUBCUTANEOUS | 1 refills | Status: DC

## 2016-02-26 MED ORDER — ALBUTEROL SULFATE (2.5 MG/3ML) 0.083% IN NEBU
INHALATION_SOLUTION | RESPIRATORY_TRACT | Status: AC
  Filled 2016-02-26: qty 3

## 2016-02-26 MED ORDER — ALBUTEROL SULFATE (2.5 MG/3ML) 0.083% IN NEBU
5.0000 mg | INHALATION_SOLUTION | Freq: Once | RESPIRATORY_TRACT | Status: AC
  Administered 2016-02-26: 5 mg via RESPIRATORY_TRACT

## 2016-02-26 MED ORDER — ENOXAPARIN (LOVENOX) PATIENT EDUCATION KIT
PACK | Freq: Once | Status: AC
  Administered 2016-02-26: 21:00:00
  Filled 2016-02-26: qty 1

## 2016-02-26 MED ORDER — ALBUTEROL SULFATE HFA 108 (90 BASE) MCG/ACT IN AERS
2.0000 | INHALATION_SPRAY | RESPIRATORY_TRACT | Status: DC
  Administered 2016-02-26: 2 via RESPIRATORY_TRACT
  Filled 2016-02-26: qty 6.7

## 2016-02-26 MED ORDER — IOPAMIDOL (ISOVUE-370) INJECTION 76%
80.0000 mL | Freq: Once | INTRAVENOUS | Status: AC | PRN
  Administered 2016-02-26: 80 mL via INTRAVENOUS

## 2016-02-26 MED ORDER — ENOXAPARIN SODIUM 150 MG/ML ~~LOC~~ SOLN
1.5000 mg/kg | Freq: Once | SUBCUTANEOUS | Status: AC
  Administered 2016-02-26: 140 mg via SUBCUTANEOUS
  Filled 2016-02-26: qty 0.92

## 2016-02-26 NOTE — ED Notes (Signed)
Pt stable, ambulatory, states understanding of discharge instructions 

## 2016-02-26 NOTE — Telephone Encounter (Signed)
Received call from Dr Jetta Lout requesting to speak with MW or provider covering for him TP took call  TP spoke with patient and advised he go to the ER [facility] ER and spoke with charge nurse Joellen Jersey, informed her pt is being sent for results Routing to TP for documentation

## 2016-02-26 NOTE — ED Notes (Signed)
Dr. Venora Maples shut monitor off in room per pt's request

## 2016-02-26 NOTE — Progress Notes (Signed)
Spouse notified of results/recs

## 2016-02-26 NOTE — Telephone Encounter (Signed)
Pt advised of resutls and to go to ER .  ER called and aware pt is en route.

## 2016-02-26 NOTE — ED Triage Notes (Signed)
Per Pt, Pt is coming from [facility] after being seen by MD being positive for PE. Pt reports Hx of cough and SOB x 2 months. Complains of productive cough with some clear sputum. Pt is speaking in complete sentences and in no acute distress at this time. Wheezes noted throughout all lung fields.

## 2016-02-26 NOTE — ED Notes (Signed)
Received call from PCP, pt had positivie CT Angio for PE.

## 2016-02-27 ENCOUNTER — Telehealth: Payer: Self-pay | Admitting: Internal Medicine

## 2016-02-27 DIAGNOSIS — R0602 Shortness of breath: Secondary | ICD-10-CM

## 2016-02-27 NOTE — Telephone Encounter (Signed)
Agree the ct is a concern but the priority for now is sorting out the clot issue and can't consider any bx or oncology referral  until we deal with it   rec Schedule venous dopplers both legs next available  Ov with all meds in hand by end of next week

## 2016-02-27 NOTE — Assessment & Plan Note (Signed)
>   3 min Discussed the risks and costs (both direct and indirect)  of smoking relative to the benefits of quitting but patient unwilling to commit at this point to a specific quit date.       

## 2016-02-27 NOTE — Telephone Encounter (Signed)
Called and spoke with pts wife and she is aware of appt with MW on 3/1.  Order has been placed for pt to have the venous doppler scheduled for both legs.  Nothing further is needed. pts wife is aware that pt will need to bring all of his meds to this appt.

## 2016-02-27 NOTE — ED Provider Notes (Signed)
[facility]-EMERGENCY DEPT Provider Note   CSN: [id number] Arrival date & time: 02/26/16  1628     History   Chief Complaint Chief Complaint  Patient presents with  . Shortness of Breath    HPI Travis [name] is a 76 y.o. male.  HPI Patient presents to the emergency department regarding an abnormal CT scan which showed a small pulmonary embolus.  Patient is being seen and worked up by his pulmonologist for persistent cough and congestion shortness of breath with a history of lung cancer 10 years ago.  Outpatient CT scan was performed yesterday demonstrated the pulmonary embolism.  It also noted a new lesion concerning for recurrent lung cancer.  Patient reports he just finished a course of steroids.  He's been using his albuterol inhaler some with mild relief.  No orthopnea.  No other complaints.  Denies unilateral leg swelling.  No prior history of DVT or pulmonary embolism.   Past Medical History:  Diagnosis Date  . AAA family hx 11/20/2014  . CAD (coronary artery disease)    s/p RCA atherectomy 1994, cath 1999 with occluded RCA with left to right collaterals, PCI of LAD 2007, s/p PCI of LAD for instent thrombosis 2008, PCI 08/2015 with DES to LAD due to ISR with known L-R collaterals to RCA  . Dyslipidemia   . Hyperlipidemia   . Hypertension   . Insomnia   . Lung cancer [facility])    s/p lobectomy, chemo and radiation therapy  . OSA (obstructive sleep apnea)    intolerant to CPAP  . PAF (paroxysmal atrial fibrillation) (HCC)   . Psoriasis   . Tobacco abuse     Patient Active Problem List   Diagnosis Date Noted  . Cough 02/26/2016  . AAA (abdominal aortic aneurysm) (HCC) 11/04/2015  . Iron deficiency anemia 10/25/2015  . Shortness of breath 10/17/2015  . Chronic anticoagulation-Xarelto 08/23/2015  . Angina, class III (HCC) 08/22/2015  . Abnormal nuclear stress test 08/13/15  08/22/2015  . Morbid obesity (HCC) 11/21/2014  . AAA family hx 11/20/2014  . Absent pulse in lower  extremity 11/20/2014  . Encounter for smoking cessation counseling 11/20/2014  . CAP (community acquired pneumonia) 05/14/2013  . CAD S/P multiple PCIs   . COPD GOLD II  09/16/2010  . Cigarette smoker 09/16/2010  . PAF (paroxysmal atrial fibrillation) (HCC)   . Essential hypertension   . Dyslipidemia   . OSA (obstructive sleep apnea)   . Lung cancer [facility])     Past Surgical History:  Procedure Laterality Date  . APPENDECTOMY    . CARDIAC CATHETERIZATION  1999  . CARDIAC CATHETERIZATION N/A 08/22/2015   Procedure: Left Heart Cath and Coronary Angiography;  Surgeon: Marykay Lex, MD;  Location: [facility] INVASIVE CV LAB;  Service: Cardiovascular;  Laterality: N/A;  . CARDIAC CATHETERIZATION N/A 08/22/2015   Procedure: Coronary Stent Intervention;  Surgeon: Marykay Lex, MD;  Location: [facility] INVASIVE CV LAB;  Service: Cardiovascular;  Laterality: N/A;  . EXPLORATORY LAPAROTOMY     secondary to trauma  . multiple PCT's to RCA and LAD  2007/2008  . s/p lung lobectomy  2007  . VIDEO BRONCHOSCOPY N/A 08/28/2012   Procedure: VIDEO BRONCHOSCOPY WITHOUT FLUORO;  Surgeon: Storm Frisk, MD;  Location: [facility] ENDOSCOPY;  Service: Cardiopulmonary;  Laterality: N/A;       Home Medications    Prior to Admission medications   Medication Sig Start Date End Date Taking? Authorizing Provider  albuterol (PROAIR HFA) 108 (90 Base) MCG/ACT inhaler  Inhale 2 puffs into the lungs every 6 (six) hours as needed for wheezing or shortness of breath.   Yes Historical Provider, MD  clopidogrel (PLAVIX) 75 MG tablet Take 1 tablet (75 mg total) by mouth daily with breakfast. 09/02/15  Yes Dayna N Dunn, PA-C  famotidine (PEPCID) 20 MG tablet One at bedtime Patient taking differently: Take 20 mg by mouth at bedtime. One at bedtime 11/25/15  Yes Nyoka Cowden, MD  isosorbide mononitrate (IMDUR) 30 MG 24 hr tablet Take 1 tablet (30 mg total) by mouth daily. Patient taking differently: Take 30 mg by mouth every evening.   10/14/15 02/26/16 Yes Bhavinkumar Bhagat, PA  MULTAQ 400 MG tablet TAKE 1 TABLET BY MOUTH TWICE A DAY 30 Patient taking differently: TAKE 1 TABLET BY MOUTH TWICE A DAY 10/06/15  Yes Quintella Reichert, MD  nitroGLYCERIN (NITROSTAT) 0.4 MG SL tablet Place 0.4 mg under the tongue every 5 (five) minutes as needed for chest pain. X 3 doses   Yes Historical Provider, MD  pantoprazole (PROTONIX) 40 MG tablet Take 1 tablet (40 mg total) by mouth daily. 09/02/15  Yes Dayna N Dunn, PA-C  rosuvastatin (CRESTOR) 10 MG tablet Take 1 tablet (10 mg total) by mouth daily. 10/21/15 02/26/16 Yes Brittainy Sherlynn Carbon, PA-C  Tiotropium Bromide-Olodaterol (STIOLTO RESPIMAT) 2.5-2.5 MCG/ACT AERS Inhale 2 puffs into the lungs daily. 02/25/16  Yes Nyoka Cowden, MD  traZODone (DESYREL) 100 MG tablet Take 200 mg by mouth at bedtime.    Yes Historical Provider, MD  amoxicillin-clavulanate (AUGMENTIN) 875-125 MG tablet Take 1 tablet by mouth 2 (two) times daily. 02/16/16   Historical Provider, MD  enoxaparin (LOVENOX) 150 MG/ML injection Inject 0.61 mLs (90 mg total) into the skin every 12 (twelve) hours. 02/26/16   Azalia Bilis, MD  predniSONE (DELTASONE) 10 MG tablet Take 10-60 mg by mouth daily. 02/16/16   Historical Provider, MD    Family History Family History  Problem Relation Age of Onset  . Heart attack Father   . Heart disease Mother   . Heart attack Mother   . Coronary artery disease Brother   . Lymphoma Brother   . Breast cancer Sister   . Diabetes Sister   . Heart disease Sister   . Colon cancer Neg Hx   . Stomach cancer Neg Hx     Social History Social History  Substance Use Topics  . Smoking status: Current Every Day Smoker    Packs/day: 0.75    Years: 58.00    Types: Cigarettes  . Smokeless tobacco: Former Neurosurgeon    Types: Chew    Quit date: 07/04/2012     Comment: started smoking at age 4. ; Pt states that he is almost quit - smokes every now and again (11/25/15)  . Alcohol use No     Allergies     Patient has no known allergies.   Review of Systems Review of Systems  All other systems reviewed and are negative.    Physical Exam Updated Vital Signs BP 149/82   Pulse 70   Temp 98.9 F (37.2 C) (Oral)   Resp 17   Ht 5\' 11"  (1.803 m)   Wt 202 lb (91.6 kg)   SpO2 96%   BMI 28.17 kg/m   Physical Exam  Constitutional: He is oriented to person, place, and time. He appears well-developed and well-nourished.  HENT:  Head: Normocephalic and atraumatic.  Eyes: EOM are normal.  Neck: Normal range of motion.  Cardiovascular: Normal  rate, regular rhythm, normal heart sounds and intact distal pulses.   Pulmonary/Chest: Effort normal and breath sounds normal. No respiratory distress.  Abdominal: Soft. He exhibits no distension. There is no tenderness.  Musculoskeletal: Normal range of motion.  Neurological: He is alert and oriented to person, place, and time.  Skin: Skin is warm and dry.  Psychiatric: He has a normal mood and affect. Judgment normal.  Nursing note and vitals reviewed.    ED Treatments / Results  Labs (all labs ordered are listed, but only abnormal results are displayed) Labs Reviewed  BASIC METABOLIC PANEL - Abnormal; Notable for the following:       Result Value   Glucose, Bld 120 (*)    All other components within normal limits  CBC - Abnormal; Notable for the following:    WBC 11.4 (*)    MCH 25.2 (*)    RDW 15.7 (*)    All other components within normal limits    EKG  EKG Interpretation None       Radiology Ct Angio Chest Pe W Or Wo Contrast  Result Date: 02/26/2016 CLINICAL DATA:  Increasing shortness of breath. Frequent car travels. EXAM: CT ANGIOGRAPHY CHEST WITH CONTRAST TECHNIQUE: Multidetector CT imaging of the chest was performed using the standard protocol during bolus administration of intravenous contrast. Multiplanar CT image reconstructions and MIPs were obtained to evaluate the vascular anatomy. CONTRAST:  80 cc Isovue 370  intravenously. COMPARISON:  Chest radiograph 02/25/2016 FINDINGS: Cardiovascular: Satisfactory opacification of the pulmonary arteries to the segmental level. A small pulmonary embolus within a subsegmental branch in the left upper lobe. Normal heart size. No pericardial effusion. Calcific atherosclerotic disease of the coronary arteries. Mediastinum/Nodes: Several mildly enlarged mediastinal lymph nodes. Thyroid gland, trachea and esophagus within normal limits. Lungs/Pleura: There is an irregular soft tissue mass in the left hemithorax at the bifurcation of the left lower and lingular bronchus. The mass causes narrowing of the bronchi and the traversing lower lobe pulmonary artery. This mass is medial and superior to prior postsurgical/ posttreatment site from left upper lobe wedge resection. The mass measures 5.9 by 4.9 by 6.5 cm. Numerous spiculated pulmonary nodules are seen superior to the index mass, consistent with satellite lesions. Upper lobe predominant emphysema. Upper Abdomen: No acute abnormality.  Cholelithiasis. Musculoskeletal: No chest wall abnormality. No acute or significant osseous findings. Review of the MIP images confirms the above findings. IMPRESSION: Development of spiculated highly suspicious soft tissue mass medial to the postsurgical site from prior left upper lobe wedge resection with numerous satellite lesions in the superior lung parenchyma. Findings are consistent with primary lung cancer recurrence. Borderline mediastinal lymphadenopathy. Calcific atherosclerotic disease of the coronary arteries. Cholelithiasis. Tiny, probably sub clinical pulmonary embolus within a subsegmental branch of the left upper lobar pulmonary artery. These results were called by telephone at the time of interpretation on 02/26/2016 at 3:45 pm to Dr. Rexene Edison, NP , who verbally acknowledged these results. Electronically Signed   By: Fidela Salisbury M.D.   On: 02/26/2016 15:48   E[facility] Cm  Result Date: 02/26/2016 CLINICAL DATA:  Two months history of productive cough. Evaluate for sinusitis. EXAM: CT PARANASAL SINUS LIMITED WITHOUT CONTRAST TECHNIQUE: Non-contiguous multidetector CT images of the paranasal sinuses were obtained in a single plane without contrast. COMPARISON:  None. FINDINGS: The globes and extraocular muscles appear symmetrical. No air fluid levels in the paranasal sinuses. The frontal bones, orbital rims, maxillary antral walls, nasal bones, nasal septum,  nasal spine, maxilla, pterygoid plates, zygomatic arches, temporomandibular joints are intact. IMPRESSION: No evidence of acute or chronic sinusitis. Electronically Signed   By: Fidela Salisbury M.D.   On: 02/26/2016 15:50    Procedures Procedures (including critical care time)  Medications Ordered in ED Medications  albuterol (PROVENTIL) (2.5 MG/3ML) 0.083% nebulizer solution 5 mg ( Nebulization Not Given 02/26/16 1658)  enoxaparin (LOVENOX) injection 140 mg (140 mg Subcutaneous Given 02/26/16 2104)  enoxaparin (LOVENOX) patient education kit ( Does not apply Given 02/26/16 2104)     Initial Impression / Assessment and Plan / ED Course  I have reviewed the triage vital signs and the nursing notes.  Pertinent labs & imaging results that were available during my care of the patient were reviewed by me and considered in my medical decision making (see chart for details).     Patient symptoms are likely more consistent with new left lung cancer given his persistent cough and shortness of breath that is not improving with standard treatment.  On CT scan and an incidental pulmonary embolism was found as well.  He is not tachycardic or hypoxic.  I'm not convinced that the small pulmonary embolism as the cause of all of his symptoms.  I suspect the majority of his symptoms are coming more from the recurrent lung cancer.  He currently is on Xarelto and I will treat this as a treatment failure.  I placed the  patient on twice a day Lovenox.  I will not initiate patient on Coumadin at this time as he will likely need tissue sampling may need to come off anticoagulants.  Patient is instructed to follow-up with outpatient pulmonary and outpatient oncology for further workup.  I do not think he needs to be admitted the hospital at this time.  I think putting the patient the hospital especially with her recent significant fluid exposures including patients upstairs I think admission the hospital will put him at greater risk.  This was all explained to the patient and the patient's spouse were agreeable with outpatient plan.  Final Clinical Impressions(s) / ED Diagnoses   Final diagnoses:  Pulmonary mass  COPD exacerbation ([place])  Other pulmonary embolism without acute cor pulmonale, unspecified chronicity ([place])    New Prescriptions Discharge Medication List as of 02/26/2016  9:08 PM    START taking these medications   Details  enoxaparin (LOVENOX) 150 MG/ML injection Inject 0.61 mLs (90 mg total) into the skin every 12 (twelve) hours., Starting Thu 02/26/2016, Print         Jola Schmidt, MD 02/27/16 [phone]

## 2016-02-27 NOTE — Assessment & Plan Note (Signed)
No evidence of chf/ thyroid dz/ anemia and cxr is abn so rec CTa next

## 2016-02-27 NOTE — Telephone Encounter (Signed)
Called and spoke with pts wife and she stated that the ER doctor told them that the cancer has come back. She wanted to make sure the MW looks at the CT and agrees with this.  She stated that the ER doctor told them to call over to oncology and set up an appt.  She stated that oncology is needing an referral and stated that the pt would need a biopsy done prior to an appt being made.  MW please advise. Thanks   No Known Allergies

## 2016-02-27 NOTE — Assessment & Plan Note (Addendum)
PFT's   09/16/10   FEV1 2.60 (85 % ) ratio 61  p 14 % improvement from saba with DLCO  74 % corrects to 79  % for alv volume   - spirometry 11/20/2014   FEV1  1.92 (61%) ratio 61  - 10/17/2015   Try  symbicort 160 2bid ? Effective (pt does not recall) - Spirometry 11/25/2015  FEV1 1.55 (50%)  Ratio 53 p nothing prior - 11/25/2015    try stiolto 2 each am  - 02/25/2016  After extensive coaching HFA effectiveness =    75% with respimat  - Spirometry 02/25/2016  FEV1 1.58 (51%)  Ratio 56 on stiolto        Symptoms remain very difficult to control on present rx with no change in FEV1  - DDX of  difficult airways management almost all start with A and  include Adherence, Ace Inhibitors, Acid Reflux, Active Sinus Disease, Alpha 1 Antitripsin deficiency, Anxiety masquerading as Airways dz,  ABPA,  Allergy(esp in young), Aspiration (esp in elderly), Adverse effects of meds,  Active smokers, A bunch of PE's (a small clot burden can't cause this syndrome unless there is already severe underlying pulm or vascular dz with poor reserve) plus two Bs  = Bronchiectasis and Beta blocker use..and one C= CHF   Adherence is always the initial "prime suspect" and is a multilayered concern that requires a "trust but verify" approach in every patient - starting with knowing how to use medications, especially inhalers, correctly, keeping up with refills and understanding the fundamental difference between maintenance and prns vs those medications only taken for a very short course and then stopped and not refilled.  - respimat training as above - return with all meds in hand using a trust but verify approach to confirm accurate Medication  Reconciliation The principal here is that until we are certain that the  patients are doing what we've asked, it makes no sense to ask them to do more.   Active smoking obviously top of the list of usual suspects (see separate a/p)    ? Acid (or non-acid) GERD > always difficult to  exclude as up to 75% of pts in some series report no assoc GI/ Heartburn symptoms> rec continue max (24h)  acid suppression and diet restrictions/ reviewed     ? Allergy/asthmatic component > ? Needs back on ics/laba/lama combo eg symb/spiriva smi   ? A bunch of PE's > seems unlikely on Xarelto but if has recurrent ca could be hypercogulable > repeat CTa ordered   ? Active sinus Dz > sinus CT  ? chf > unlikely with bnp so low   I had an extended discussion with the patient reviewing all relevant studies completed to date and  lasting 25 minutes of a 40  minute office visit focusing on refractory  non-specific but potentially very serious refractory respiratory symptoms of unknown etiology.  Each maintenance medication was reviewed in detail including most importantly the difference between maintenance and prns and under what circumstances the prns are to be triggered using an action plan format that is not reflected in the computer generated alphabetically organized AVS.    Please see AVS for specific instructions unique to this office visit that I personally wrote and verbalized to the the pt in detail and then reviewed with pt  by my nurse highlighting any changes in therapy/plan of care  recommended at today's visit.

## 2016-03-01 ENCOUNTER — Encounter: Payer: Self-pay | Admitting: *Deleted

## 2016-03-01 DIAGNOSIS — Z006 Encounter for examination for normal comparison and control in clinical research program: Secondary | ICD-10-CM

## 2016-03-01 NOTE — Progress Notes (Addendum)
LEADERS FREE II Research study month 6 follow up visit completed. Patient stated that his lung cancer is back. He sloa has a small pulmonary Embolus that was incidentally found. He was taken off his Xarelto and placed on Lovenox. He denies any chest pain just had shortness of breath. EKG obtained.

## 2016-03-02 ENCOUNTER — Encounter: Payer: Self-pay | Admitting: Internal Medicine

## 2016-03-02 ENCOUNTER — Telehealth: Payer: Self-pay | Admitting: Internal Medicine

## 2016-03-02 ENCOUNTER — Ambulatory Visit (HOSPITAL_COMMUNITY)
Admission: RE | Admit: 2016-03-02 | Discharge: 2016-03-02 | Disposition: A | Discharge status: Home / Self Care | Payer: Medicare Other | Source: Ambulatory Visit | Attending: Internal Medicine | Admitting: Internal Medicine

## 2016-03-02 DIAGNOSIS — Z86718 Personal history of other venous thrombosis and embolism: Secondary | ICD-10-CM | POA: Diagnosis present

## 2016-03-02 DIAGNOSIS — R0602 Shortness of breath: Secondary | ICD-10-CM | POA: Insufficient documentation

## 2016-03-02 DIAGNOSIS — I2699 Other pulmonary embolism without acute cor pulmonale: Secondary | ICD-10-CM | POA: Insufficient documentation

## 2016-03-02 NOTE — Progress Notes (Signed)
VASCULAR LAB PRELIMINARY  PRELIMINARY  PRELIMINARY  PRELIMINARY  Bilateral lower extremity venous duplex completed.    Preliminary report:  Bilateral:  No evidence of DVT, superficial thrombosis, or Baker's Cyst.   Sutton Hirsch, RVS 03/02/2016, 12:09 PM

## 2016-03-02 NOTE — Telephone Encounter (Signed)
Dr. Melvyn Novas   I received the call report from the hospital on this pt. They stated his Venus Doppler was negative

## 2016-03-02 NOTE — Progress Notes (Signed)
Spoke with pt and notified of results per Dr. Wert. Pt verbalized understanding and denied any questions. 

## 2016-03-02 NOTE — Telephone Encounter (Signed)
Aware / no change in rx

## 2016-03-04 ENCOUNTER — Ambulatory Visit (INDEPENDENT_AMBULATORY_CARE_PROVIDER_SITE_OTHER): Payer: Medicare Other | Admitting: Internal Medicine

## 2016-03-04 ENCOUNTER — Encounter: Payer: Self-pay | Admitting: Internal Medicine

## 2016-03-04 ENCOUNTER — Telehealth: Payer: Self-pay | Admitting: *Deleted

## 2016-03-04 VITALS — BP 118/66 | HR 69 | Ht 71.0 in | Wt 203.0 lb

## 2016-03-04 DIAGNOSIS — I2699 Other pulmonary embolism without acute cor pulmonale: Secondary | ICD-10-CM | POA: Diagnosis not present

## 2016-03-04 DIAGNOSIS — C3492 Malignant neoplasm of unspecified part of left bronchus or lung: Secondary | ICD-10-CM | POA: Diagnosis not present

## 2016-03-04 DIAGNOSIS — R918 Other nonspecific abnormal finding of lung field: Secondary | ICD-10-CM | POA: Diagnosis not present

## 2016-03-04 DIAGNOSIS — J449 Chronic obstructive pulmonary disease, unspecified: Secondary | ICD-10-CM

## 2016-03-04 MED ORDER — PREDNISONE 10 MG PO TABS: ORAL_TABLET | ORAL | 0 refills | Status: DC

## 2016-03-04 MED ORDER — ACETAMINOPHEN-CODEINE #3 300-30 MG PO TABS: ORAL_TABLET | ORAL | 0 refills | Status: DC

## 2016-03-04 NOTE — Telephone Encounter (Signed)
Oncology Nurse Navigator Documentation  Oncology Nurse Navigator Flowsheets 03/04/2016  Navigator Location [facility]  Referral date to RadOnc/MedOnc 03/04/2016  Navigator Encounter Type Telephone/I received referral today from Dr. Gustavus Bryant office. I called Mr. Calvin but was unable to reach him.    Telephone Outgoing Call  Treatment Phase Abnormal Scans  Barriers/Navigation Needs Coordination of Care  Interventions Coordination of Care  Coordination of Care Appts  Acuity Level 2  Acuity Level 2 Assistance expediting appointments  Time Spent with Patient 30

## 2016-03-04 NOTE — Patient Instructions (Addendum)
Plan A = Automatic = Stiolto 2 pffs each am    Plan B = Backup Only use your albuterol as a rescue medication to be used if you can't catch your breath by resting or doing a relaxed purse lip breathing pattern.  - The less you use it, the better it will work when you need it. - Ok to use the inhaler up to 2 puffs  every 4 hours if you must but call for appointment if use goes up over your usual need - Don't leave home without it !!  (think of it like the spare tire for your car)   Plan C = Crisis - only use your albuterol nebulizer if you first try Plan B and it fails to help > ok to use the nebulizer up to every 4 hours but if start needing it regularly call for immediate appointment   For cough mucinex dm up to 1200 mg every 12 hours and use the flutter valve as much as possible and supplement with Tylenol #3 up to 2 every 4 hours as needed   Prednisone 10 mg 2 each am until better then 10 mg daily    Please see patient coordinator before you leave today  to schedule PET scan and oncology eval   Come to outpatient registration at [facility] (behind the ER) at [place] [address] with nothing to eat or drink after midnight Wednesday for Bronchoscopy and stop lovenox after Tuesday nights dose and plavix after Tuesday am dose

## 2016-03-04 NOTE — Progress Notes (Signed)
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  Recent admit for RLL CAP , COPD exacerbation .  Tx w/ IV abx,  And discharged on omnicef .  Stay compliacted by Atrial fib with RVR. Improved with cardizem drip. Cards consult with plan to cont mutltaq and pradaxa.  Pt reports is 85% improved since discharge. Still having some lingering wheezing.   Denies any increased SOB, tightness, f/c/s, hemoptysis, nausea, vomiting.  finished abx this morning.  hasn't smoked since 5/10-encouraged on smoking cessation.  On ACE i   rec Continue on current regimen.  Take Spiriva daily  Great job on not smoking .  Mucinex DM Twice daily  As needed  Cough/congestion  Please contact office for sooner follow up if symptoms do not improve or worsen or seek emergency care     11/20/2014  Consultation/Travis [name] re: active smoker/ GOLD II copd  Not on active meds  Chief Complaint  Patient presents with  . Pulmonary Consult    Former patient of Dr Joya Gaskins. Pt needing DOT clearance. Pt states that his breathing is doing well. He denies any co's today.    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  Stop  lisinopril and start ibesartan 75 mg daily - ok to break in half if too strong and just take a half pill daily      10/17/2015 acute extended ov/Travis [name] re:  GOLD II CPD no maint rx   Chief Complaint  Patient presents with  . Follow-up    Pt states having increased DOE x 3-4 months. He states he gets SOB with any exertion at all, esp walking an incline. He has some chest tightness on exertion. He has occ cough with clear sputum.    unsure of meds/ took ? One pfff symb this am with some relief, sleeping fine on cpap   Indolent onset/progressive x 3-4 m Doe = MMRC3 = can't walk 100 yards even at a slow pace at a flat grade s stopping due to sob   rec symbicort 160 Take 2 puffs first thing in am and then another 2 puffs about 12 hours later.  Pantoprazole (protonix) 40 mg   Take  30-60 min before first meal of the day and Pepcid (famotidine)  20 mg one @  bedtime until return to office - this is the best way to tell whether stomach acid is contributing to your problem.   GERD  11/25/2015  f/u ov/Travis [name] re: copd II/ copd still smoking/ really confused with details of care  Chief Complaint  Patient presents with  . Follow-up    4wk rov - Pt c/o cough x 1 week. Thick mucus production, white in color. Denies CP/chest tightness and wheezing. Using Robitussin OTC for cough and Mucinex DM.  Little relief   again unsure of meds, not clear whether even ever used symb samples/ did not fill rx Cough is worst first thing in am but even then just white,  No purulent sputum or mucus plugs    Doe continues = MMRC3  rec Pantoprazole (protonix) 40 mg   Take  30-60 min before first meal of the day and Pepcid (famotidine)  20 mg one @  bedtime until return to office - this is the best way to tell whether stomach acid is contributing to your problem.   Stiolto 2 pffs each am  Work on inhaler technique: Prednisone 10 mg take  4 each am x 2 days,   2 each am x 2 days,  1 each am x 2 days and stop  For  cough mucinex dm up to 1200 mg every 12 hours  The key is to stop smoking completely before smoking completely stops you! Please schedule a follow up office visit in 2 weeks, sooner if needed with all active medications in hand   12/16/2015  f/u ov/Travis [name] re: copd II/ quit  Smoking since last ov / stiolto daily  Chief Complaint  Patient presents with  . Follow-up    Cough has slightly improved. Cough is occ prod in the am's with clear sputum.  His breathing has improved.   doe =  MMRC3  Minimal am mucus / clears easily s noct disturbance Thinks overall better on stiolto/ no striking resp to pred  "took all my iron" no clear plan for f/u per pt (see last GI note which suggests otherwise)  rec Please remember to go to the lab  department downstairs for your tests -  Your Iron and Iron deficiency needs to continue to be followed in the [place] 2 each am and call for follow up as needed if not happy with your breathing     02/25/2016  f/u ov/Travis [name] re:  GOLD II copd/ still smoking  Chief Complaint  Patient presents with  . Acute Visit    Pt c/o increased SOB and cough over the past month. Cough is prod with thick, clear sputum.  He was seen by PCP 2 wks ago and was txed with Amoxicillin and Pred and this gave no relief. He also c/o wheezing.   cough is worse in shower  cpap hs works fine  MMRC3 = can't walk 100 yards even at a slow pace at a flat grade s stopping due to sob   Last time could do HT walking  was Fall  2017  rec Pantoprazole (protonix) 40 mg   Take  30-60 min before first meal of the day and Pepcid (famotidine)  20 mg one @  bedtime until return to office - this is the best way to tell whether stomach acid is contributing to your problem.   Continue stiolto 2 pffs first thing am each am  Work on inhaler technique:  relax and gently blow all the way out then take a nice smooth deep breath back in, triggering the inhaler at same time you start breathing in.  Hold  for up to 5 seconds if  you can. Blow out thru nose. Rinse and gargle with water when done.  Only use your albuterol as a rescue medication For cough mucinex dm up to 1200 mg every 12 hours and use the flutter valve as much as possible  The key is to stop smoking completely before smoking completely stops you!  cxr was abn > See CTa 02/26/16  C/w recurrent ca and PE's > rx lovenox  Venous dopplers 03/02/2016 > neg bilaterally      03/04/2016 extended  f/u ov/Travis [name] re: copd II/ quit smoking 02/27/16 / pe's/ recurrent ca  Chief Complaint  Patient presents with  . Follow-up    Cough has become worse since last ov- producing some clear sputum. He is also feeling more SOB. He states he is wheezing "all the time".     maint on stiolto / no better with saba Does some better when on pred vs off        No obvious day to day or daytime variability or assoc purulent sputum mucus plugs or hemoptysis or cp or chest tightness, subjective wheeze or overt sinus or hb symptoms. No unusual exp hx or h/o childhood pna/ asthma or knowledge of premature birth.  Sleeping ok without nocturnal  or early am exacerbation  of respiratory  c/o's or need for noct saba. Also denies any obvious fluctuation of symptoms with weather or environmental changes or other aggravating or alleviating factors except as outlined above   Current Medications, Allergies, Complete Past Medical History, Past Surgical History, Family History, and Social History were reviewed in [employer] record.  ROS  The following are not active complaints unless bolded sore throat, dysphagia, dental problems, itching, sneezing,  nasal congestion or excess/ purulent secretions, ear ache,   fever, chills, sweats, unintended wt loss, classically pleuritic or exertional cp,  orthopnea pnd or leg swelling, presyncope, palpitations, abdominal pain, anorexia, nausea, vomiting, diarrhea  or change in bowel or bladder habits, change in stools  or urine, dysuria,hematuria,  rash, arthralgias, visual complaints, headache, numbness, weakness or ataxia or problems with walking or coordination,  change in mood/affect or memory.               Objective:   Physical Exam   GEN: A/Ox3;   NAD, elderly wm  Who appears chronically ill    02/25/2016        202  12/16/2015      201  11/25/2015      190  10/17/2015     205  11/20/14 201 lb (91.173 kg)  11/20/14 200 lb (90.719 kg)  04/29/14 207 lb (93.895 kg)    Vital signs reviewed -  - Note on arrival 02 sats  97% on RA      HEENT:  [place]/AT,  EACs-clear, TMs-wnl, NOSE-clear drainage  THROAT-clear, no lesions, no postnasal drip or exudate noted.   NECK:  Supple w/ fair ROM; no JVD; normal carotid impulses w/o bruits; no thyromegaly or nodules palpated; no lymphadenopathy.    RESP  Diminished BS in bases ,  Bilateral insp and exp rhonchi most  prominent LLL  CARD:  RRR, no m/r/g  , no peripheral edema, pulses intact, no cyanosis or clubbing.  GI:  Mod obese/  Soft & nt; nml bowel sounds; no organomegaly or masses detected. / pos hoover's at end insp   Musco: Warm bil, no deformities or joint swelling noted.   Neuro: alert, no focal deficits noted.    Skin: Warm, no lesions  or rashes                       Assessment & Plan:

## 2016-03-07 NOTE — Assessment & Plan Note (Signed)
CTa chest 02/26/16  Development of spiculated highly suspicious soft tissue mass medial to the postsurgical site from prior left upper lobe wedge resection with numerous satellite lesions in the superior lung parenchyma. Findings are consistent with primary lung cancer recurrence. Borderline mediastinal lymphadenopathy - FOB  03/11/16 >>>  Based on exam / refractory airway symptoms the first step is to look at the airways for tumor involvment/ recurrence  Discussed in detail all the  indications, usual  risks and alternatives  relative to the benefits with patient who agrees to proceed with bronchoscopy with biopsy.

## 2016-03-07 NOTE — Assessment & Plan Note (Signed)
See CTa 02/26/16  Venous dopplers 03/02/2016 > neg bilaterally   These are relatively small and no immediate risk of recurrence with neg venous dopplers and most likely the result of recurrent lung ca/ hypercoagulable state so ok to hold the lovenox for the bronchoscopy needs to sort out the problem

## 2016-03-07 NOTE — Assessment & Plan Note (Addendum)
PFT's   09/16/10   FEV1 2.60 (85 % ) ratio 61  p 14 % improvement from saba with DLCO  74 % corrects to 79  % for alv volume   - spirometry 11/20/2014   FEV1  1.92 (61%) ratio 61  - 10/17/2015   Try  symbicort 160 2bid ? Effective (pt does not recall) - Spirometry 11/25/2015  FEV1 1.55 (50%)  Ratio 53 p nothing prior - 11/25/2015    try stiolto 2 each am  - Spirometry 02/25/2016  FEV1 1.58 (51%)  Ratio 56 on stiolto      - 03/04/2016  After extensive coaching HFA effectiveness =    90% with [facility]  - started daily pred 03/04/2016 >>>  The goal with a chronic steroid dependent illness is always arriving at the lowest effective dose that controls the disease/symptoms and not accepting a set "formula" which is based on statistics or guidelines that don't always take into account patient  variability or the natural hx of the dz in every individual patient, which may well vary over time.  For now therefore I recommend the patient maintain  A ceiling of 20 mg and a floor of 10 mg daily for now  I had an extended discussion with the patient reviewing all relevant studies completed to date and  lasting 15 to 20 minutes of a 25 minute visit    Each maintenance medication was reviewed in detail including most importantly the difference between maintenance and prns and under what circumstances the prns are to be triggered using an action plan format that is not reflected in the computer generated alphabetically organized AVS.    Please see AVS for specific instructions unique to this visit that I personally wrote and verbalized to the the pt in detail and then reviewed with pt  by my nurse highlighting any  changes in therapy recommended at today's visit to their plan of care.

## 2016-03-07 NOTE — Assessment & Plan Note (Addendum)
s/p Lulobectomy,   radiation therapy 2008> referred back to oncology 03/04/2016  - PET    ordered

## 2016-03-08 ENCOUNTER — Telehealth: Payer: Self-pay | Admitting: *Deleted

## 2016-03-08 DIAGNOSIS — C3492 Malignant neoplasm of unspecified part of left bronchus or lung: Secondary | ICD-10-CM

## 2016-03-08 NOTE — Telephone Encounter (Signed)
Oncology Nurse Navigator Documentation  Oncology Nurse Navigator Flowsheets 03/08/2016  Navigator Location [facility]  Navigator Encounter Type Telephone/I called Travis [name] to schedule an appt for [name] next week. I was unable to reach and son stated to call back in an a hour.    Treatment Phase Abnormal Scans  Barriers/Navigation Needs Coordination of Care  Interventions Coordination of Care  Coordination of Care Appts;Other  Acuity Level 2  Time Spent with Patient 30

## 2016-03-08 NOTE — Telephone Encounter (Signed)
Oncology Nurse Navigator Documentation  Oncology Nurse Navigator Flowsheets 03/08/2016  Navigator Location [facility]  Navigator Encounter Type Telephone/I called Travis [name] back today.  I gave him the appt to be seen at [facility] on 03/18/16 arrive at 1:30.  He verbalized understanding of appt time and place.   Telephone Outgoing Call  Treatment Phase Abnormal Scans  Barriers/Navigation Needs Coordination of Care  Interventions Coordination of Care  Coordination of Care Appts  Acuity Level 1  Time Spent with Patient 15

## 2016-03-11 ENCOUNTER — Ambulatory Visit (HOSPITAL_COMMUNITY)
Admission: RE | Admit: 2016-03-11 | Discharge: 2016-03-11 | Disposition: A | Discharge status: Home / Self Care | Payer: Medicare Other | Source: Ambulatory Visit | Attending: Internal Medicine | Admitting: Internal Medicine

## 2016-03-11 ENCOUNTER — Encounter (HOSPITAL_COMMUNITY)
Admission: RE | Disposition: A | Discharge status: Home / Self Care | Payer: Self-pay | Source: Ambulatory Visit | Attending: Internal Medicine

## 2016-03-11 DIAGNOSIS — I4891 Unspecified atrial fibrillation: Secondary | ICD-10-CM | POA: Diagnosis not present

## 2016-03-11 DIAGNOSIS — I2510 Atherosclerotic heart disease of native coronary artery without angina pectoris: Secondary | ICD-10-CM | POA: Diagnosis not present

## 2016-03-11 DIAGNOSIS — Z7901 Long term (current) use of anticoagulants: Secondary | ICD-10-CM | POA: Insufficient documentation

## 2016-03-11 DIAGNOSIS — Z902 Acquired absence of lung [part of]: Secondary | ICD-10-CM | POA: Insufficient documentation

## 2016-03-11 DIAGNOSIS — Z87891 Personal history of nicotine dependence: Secondary | ICD-10-CM | POA: Insufficient documentation

## 2016-03-11 DIAGNOSIS — Z9989 Dependence on other enabling machines and devices: Secondary | ICD-10-CM | POA: Diagnosis not present

## 2016-03-11 DIAGNOSIS — J449 Chronic obstructive pulmonary disease, unspecified: Secondary | ICD-10-CM | POA: Insufficient documentation

## 2016-03-11 DIAGNOSIS — G4730 Sleep apnea, unspecified: Secondary | ICD-10-CM | POA: Diagnosis not present

## 2016-03-11 DIAGNOSIS — C3412 Malignant neoplasm of upper lobe, left bronchus or lung: Secondary | ICD-10-CM | POA: Diagnosis not present

## 2016-03-11 DIAGNOSIS — Z86711 Personal history of pulmonary embolism: Secondary | ICD-10-CM | POA: Diagnosis not present

## 2016-03-11 DIAGNOSIS — Z923 Personal history of irradiation: Secondary | ICD-10-CM | POA: Diagnosis not present

## 2016-03-11 DIAGNOSIS — R918 Other nonspecific abnormal finding of lung field: Secondary | ICD-10-CM | POA: Diagnosis present

## 2016-03-11 DIAGNOSIS — C3490 Malignant neoplasm of unspecified part of unspecified bronchus or lung: Secondary | ICD-10-CM | POA: Diagnosis present

## 2016-03-11 HISTORY — PX: VIDEO BRONCHOSCOPY: SHX5072

## 2016-03-11 SURGERY — VIDEO BRONCHOSCOPY WITHOUT FLUORO
Anesthesia: Moderate Sedation | Laterality: Bilateral

## 2016-03-11 MED ORDER — METOPROLOL TARTRATE 5 MG/5ML IV SOLN
INTRAVENOUS | Status: DC | PRN
  Administered 2016-03-11 (×2): 2.5 mg via INTRAVENOUS

## 2016-03-11 MED ORDER — PHENYLEPHRINE HCL 0.25 % NA SOLN
NASAL | Status: DC | PRN
  Administered 2016-03-11: 2 via NASAL

## 2016-03-11 MED ORDER — MIDAZOLAM HCL 10 MG/2ML IJ SOLN
INTRAMUSCULAR | Status: DC | PRN
  Administered 2016-03-11 (×2): 2.5 mg via INTRAVENOUS

## 2016-03-11 MED ORDER — LIDOCAINE HCL 2 % EX GEL: 1.0000 "application " | Freq: Once | CUTANEOUS | Status: DC

## 2016-03-11 MED ORDER — SODIUM CHLORIDE 0.9 % IV SOLN
INTRAVENOUS | Status: DC
  Administered 2016-03-11: 08:00:00 via INTRAVENOUS

## 2016-03-11 MED ORDER — MIDAZOLAM HCL 5 MG/ML IJ SOLN: 1.0000 mg | Freq: Once | INTRAMUSCULAR | Status: DC

## 2016-03-11 MED ORDER — EPINEPHRINE PF 1 MG/10ML IJ SOSY
PREFILLED_SYRINGE | INTRAMUSCULAR | Status: DC | PRN
  Administered 2016-03-11: 1 via ENDOTRACHEOPULMONARY

## 2016-03-11 MED ORDER — PHENYLEPHRINE HCL 0.25 % NA SOLN: 1.0000 | Freq: Four times a day (QID) | NASAL | Status: DC | PRN

## 2016-03-11 MED ORDER — MEPERIDINE HCL 25 MG/ML IJ SOLN
INTRAMUSCULAR | Status: DC | PRN
  Administered 2016-03-11: 50 mg via INTRAVENOUS

## 2016-03-11 MED ORDER — MIDAZOLAM HCL 5 MG/ML IJ SOLN
INTRAMUSCULAR | Status: AC
  Filled 2016-03-11: qty 2

## 2016-03-11 MED ORDER — MEPERIDINE HCL 100 MG/ML IJ SOLN
INTRAMUSCULAR | Status: AC
  Filled 2016-03-11: qty 2

## 2016-03-11 MED ORDER — MEPERIDINE HCL 100 MG/ML IJ SOLN: 100.0000 mg | Freq: Once | INTRAMUSCULAR | Status: DC

## 2016-03-11 MED ORDER — LIDOCAINE HCL 1 % IJ SOLN
INTRAMUSCULAR | Status: DC | PRN
  Administered 2016-03-11: 6 mL via RESPIRATORY_TRACT

## 2016-03-11 MED ORDER — LIDOCAINE HCL 2 % EX GEL
CUTANEOUS | Status: DC | PRN
  Administered 2016-03-11: 1

## 2016-03-11 NOTE — H&P (Signed)
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  Recent admit for RLL CAP , COPD exacerbation .  Tx w/ IV abx,  And discharged on omnicef .  Stay compliacted by Atrial fib with RVR. Improved with cardizem drip. Cards consult with plan to cont mutltaq and pradaxa.  Pt reports is 85% improved since discharge. Still having some lingering wheezing.   Denies any increased SOB, tightness, f/c/s, hemoptysis, nausea, vomiting.  finished abx this morning.  hasn't smoked since 5/10-encouraged on smoking cessation.  On ACE i   rec Continue on current regimen.  Take Spiriva daily  Great job on not smoking .  Mucinex DM Twice daily  As needed  Cough/congestion  Please contact office for sooner follow up if symptoms do not improve or worsen or seek emergency care     11/20/2014  Consultation/Travis [name] re: active smoker/ GOLD II copd  Not on active meds      Chief Complaint  Patient presents with  . Pulmonary Consult    Former patient of Dr Joya Gaskins. Pt needing DOT clearance. Pt states that his breathing is doing well. He denies any co's today.    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  Stop  lisinopril and start ibesartan 75 mg daily - ok to break in half if too strong and just take a half pill daily      10/17/2015 acute extended ov/Travis [name] re:  GOLD II CPD no maint rx       Chief Complaint  Patient presents with  . Follow-up    Pt states having increased DOE x 3-4 months. He states he gets SOB with any exertion at all, esp walking an incline. He has some chest tightness on exertion. He has occ cough with clear sputum.    unsure of meds/ took ? One pfff symb this am with some relief, sleeping fine on cpap   Indolent onset/progressive x 3-4 m Doe = MMRC3 = can't walk 100 yards even at a slow pace at a flat grade s stopping due to sob   rec symbicort 160 Take 2 puffs first thing in am and then another 2 puffs about 12 hours later.  Pantoprazole (protonix) 40 mg   Take  30-60 min before first meal of the day and Pepcid (famotidine)  20 mg one @  bedtime until return to office - this is the best way to tell whether stomach acid is contributing to your problem.  GERD         11/25/2015  f/u ov/Travis [name] re: copd II/ copd still smoking/ really confused with details of care      Chief Complaint  Patient presents with  . Follow-up    4wk rov - Pt c/o cough x 1 week. Thick mucus production, white in color. Denies CP/chest tightness and wheezing. Using Robitussin OTC for cough and Mucinex DM.  Little relief   again unsure of meds, not clear whether even ever used symb samples/ did not fill rx Cough is worst first thing in am but even then just white,  No purulent sputum or mucus plugs    Doe continues = MMRC3  rec Pantoprazole (protonix) 40 mg   Take  30-60 min before first meal of the day and Pepcid (famotidine)  20 mg one @  bedtime until return to office - this is the best way to tell whether stomach acid is contributing to your problem.   Stiolto 2 pffs each am  Work on inhaler technique: Prednisone 10 mg take  4 each am x 2 days,   2 each am x 2 days,  1 each am x 2  days and stop  For cough mucinex dm up to 1200 mg every 12 hours  The key is to stop smoking completely before smoking completely stops you! Please schedule a follow up office visit in 2 weeks, sooner if needed with all active medications in hand   12/16/2015  f/u ov/Travis [name] re: copd II/ quit  Smoking since last ov / stiolto daily      Chief Complaint  Patient presents with  . Follow-up    Cough has slightly improved. Cough is occ prod in the am's with clear sputum.  His breathing has improved.   doe =  MMRC3  Minimal am mucus / clears easily s noct disturbance Thinks overall better on stiolto/ no striking resp to pred  "took all my iron" no clear plan for f/u per pt (see last GI note which suggests otherwise)  rec Please remember to go to the lab  department downstairs for your tests -  Your Iron and Iron deficiency needs to continue to be followed in the [place] 2 each am and call for follow up as needed if not happy with your breathing     02/25/2016  f/u ov/Travis [name] re:  GOLD II copd/ still smoking      Chief Complaint  Patient presents with  . Acute Visit    Pt c/o increased SOB and cough over the past month. Cough is prod with thick, clear sputum.  He was seen by PCP 2 wks ago and was txed with Amoxicillin and Pred and this gave no relief. He also c/o wheezing.   cough is worse in shower  cpap hs works fine  MMRC3 = can't walk 100 yards even at a slow pace at a flat grade s stopping due to sob   Last time could do HT walking  was Fall  2017  rec Pantoprazole (protonix) 40 mg   Take  30-60 min before first meal of the day and Pepcid (famotidine)  20 mg one @  bedtime until return to office - this is the best way to tell whether stomach acid is contributing to your problem.   Continue stiolto 2 pffs first thing am each am  Work on inhaler technique:  relax and gently blow all the way out then take a nice smooth deep breath  back in, triggering the inhaler at  same time you start breathing in.  Hold for up to 5 seconds if you can. Blow out thru nose. Rinse and gargle with water when done.  Only use your albuterol as a rescue medication For cough mucinex dm up to 1200 mg every 12 hours and use the flutter valve as much as possible  The key is to stop smoking completely before smoking completely stops you!  cxr was abn > See CTa 02/26/16  C/w recurrent ca and PE's > rx lovenox  Venous dopplers 03/02/2016 > neg bilaterally      03/04/2016 extended  f/u ov/Travis [name] re: copd II/ quit smoking 02/27/16 / pe's/ recurrent ca      Chief Complaint  Patient presents with  . Follow-up    Cough has become worse since last ov- producing some clear sputum. He is also feeling more SOB. He states he is wheezing "all the time".     maint on stiolto / no better with saba Does some better when on pred vs off        No obvious day to day or daytime variability or assoc purulent sputum mucus plugs or hemoptysis or cp or chest tightness, subjective wheeze or overt sinus or hb symptoms. No unusual exp hx or h/o childhood pna/ asthma or knowledge of premature birth.  Sleeping ok without nocturnal  or early am exacerbation  of respiratory  c/o's or need for noct saba. Also denies any obvious fluctuation of symptoms with weather or environmental changes or other aggravating or alleviating factors except as outlined above   Current Medications, Allergies, Complete Past Medical History, Past Surgical History, Family History, and Social History were reviewed in [employer] record.  ROS  The following are not active complaints unless bolded sore throat, dysphagia, dental problems, itching, sneezing,  nasal congestion or excess/ purulent secretions, ear ache,   fever, chills, sweats, unintended wt loss, classically pleuritic or exertional cp,  orthopnea pnd or leg swelling, presyncope, palpitations, abdominal pain, anorexia, nausea, vomiting, diarrhea   or change in bowel or bladder habits, change in stools or urine, dysuria,hematuria,  rash, arthralgias, visual complaints, headache, numbness, weakness or ataxia or problems with walking or coordination,  change in mood/affect or memory.               Objective:   Physical Exam   GEN: A/Ox3;   NAD, elderly wm  Who appears chronically ill    02/25/2016        202  12/16/2015      201  11/25/2015      190     10/17/2015     205  11/20/14 201 lb (91.173 kg)  11/20/14 200 lb (90.719 kg)  04/29/14 207 lb (93.895 kg)    Vital signs reviewed -  - Note on arrival 02 sats  97% on RA      HEENT:  [place]/AT,  EACs-clear, TMs-wnl, NOSE-clear drainage  THROAT-clear, no lesions, no postnasal drip or exudate noted.   NECK:  Supple w/ fair ROM; no JVD; normal carotid impulses w/o bruits; no thyromegaly or nodules palpated; no lymphadenopathy.    RESP  Diminished BS in bases ,  Bilateral insp and exp rhonchi most  prominent LLL  CARD:  RRR, no m/r/g  , no peripheral edema, pulses intact, no cyanosis or clubbing.  GI:  Mod obese/  Soft & nt; nml bowel sounds; no organomegaly or masses detected. / pos hoover's at  end insp   Musco: Warm bil, no deformities or joint swelling noted.   Neuro: alert, no focal deficits noted.    Skin: Warm, no lesions or rashes                       Assessment & Plan:         Assessment & Plan Note by Tanda Rockers, MD at 03/07/2016 6:39 AM   Author: Tanda Rockers, MD Author Type: Physician Filed: 03/07/2016 6:47 AM  Note Status: Bernell List: Cosign Not Required Encounter Date: 03/07/2016 6:39 AM  Problem: COPD GOLD II   Editor: Tanda Rockers, MD (Physician)  Prior Versions: 1. Tanda Rockers, MD (Physician) at 03/07/2016 6:40 AM - Edited   2. Tanda Rockers, MD (Physician) at 03/07/2016 6:40 AM - Written    PFT's   09/16/10   FEV1 2.60 (85 % ) ratio 61  p 14 % improvement from saba with DLCO  74 % corrects to 79  % for alv  volume   - spirometry 11/20/2014   FEV1  1.92 (61%) ratio 61  - 10/17/2015   Try  symbicort 160 2bid ? Effective (pt does not recall) - Spirometry 11/25/2015  FEV1 1.55 (50%)  Ratio 53 p nothing prior - 11/25/2015    try stiolto 2 each am  - Spirometry 02/25/2016  FEV1 1.58 (51%)  Ratio 56 on stiolto      - 03/04/2016  After extensive coaching HFA effectiveness =    90% with [facility]  - started daily pred 03/04/2016 >>>  The goal with a chronic steroid dependent illness is always arriving at the lowest effective dose that controls the disease/symptoms and not accepting a set "formula" which is based on statistics or guidelines that don't always take into account patient  variability or the natural hx of the dz in every individual patient, which may well vary over time.  For now therefore I recommend the patient maintain  A ceiling of 20 mg and a floor of 10 mg daily for now  I had an extended discussion with the patient reviewing all relevant studies completed to date and  lasting 15 to 20 minutes of a 25 minute visit    Each maintenance medication was reviewed in detail including most importantly the difference between maintenance and prns and under what circumstances the prns are to be triggered using an action plan format that is not reflected in the computer generated alphabetically organized AVS.    Please see AVS for specific instructions unique to this visit that I personally wrote and verbalized to the the pt in detail and then reviewed with pt  by my nurse highlighting any  changes in therapy recommended at today's visit to their plan of care.         Assessment & Plan Note by Tanda Rockers, MD at 03/07/2016 6:45 AM   Author: Tanda Rockers, MD Author Type: Physician Filed: 03/07/2016 6:46 AM  Note Status: Bernell List: Cosign Not Required Encounter Date: 03/07/2016 6:45 AM  Problem: Lung cancer ([place])  Editor: Tanda Rockers, MD (Physician)  Prior Versions: 1. Tanda Rockers, MD (Physician)  at 03/07/2016 6:45 AM - Written    s/p Lulobectomy,   radiation therapy 2008> referred back to oncology 03/04/2016  - PET    ordered    Assessment & Plan Note by Tanda Rockers, MD at 03/07/2016 6:41 AM   Author: Tanda Rockers,  MD Author Type: Physician Filed: 03/07/2016 6:43 AM  Note Status: Written Cosign: Cosign Not Required Encounter Date: 03/07/2016 6:41 AM  Problem: Acute pulmonary embolism ([place])  Editor: Tanda Rockers, MD (Physician)    See CTa 02/26/16  Venous dopplers 03/02/2016 > neg bilaterally   These are relatively small and no immediate risk of recurrence with neg venous dopplers and most likely the result of recurrent lung ca/ hypercoagulable state so ok to hold the lovenox for the bronchoscopy needs to sort out the problem       Assessment & Plan Note by Tanda Rockers, MD at 03/07/2016 6:35 AM   Author: Tanda Rockers, MD Author Type: Physician Filed: 03/07/2016 6:36 AM  Note Status: Written Cosign: Cosign Not Required Encounter Date: 03/07/2016 6:35 AM  Problem: Mass of left lung  Editor: Tanda Rockers, MD (Physician)    CTa chest 02/26/16  Development of spiculated highly suspicious soft tissue mass medial to the postsurgical site from prior left upper lobe wedge resection with numerous satellite lesions in the superior lung parenchyma. Findings are consistent with primary lung cancer recurrence. Borderline mediastinal lymphadenopathy - FOB  03/11/16 >>>  Based on exam / refractory airway symptoms the first step is to look at the airways for tumor involvment/ recurrence  Discussed in detail all the  indications, usual  risks and alternatives  relative to the benefits with patient who agrees to proceed with bronchoscopy with biopsy.     Patient Instructions by Tanda Rockers, MD at 03/04/2016 9:45 AM   Author: Tanda Rockers, MD Author Type: Physician Filed: 03/04/2016 10:30 AM  Note Status: Addendum Cosign: Cosign Not Required Encounter Date: 03/04/2016 9:45 AM   Editor: Tanda Rockers, MD (Physician)  Prior Versions: 1. Tanda Rockers, MD (Physician) at 03/04/2016 10:24 AM - Addendum   2. Tanda Rockers, MD (Physician) at 03/04/2016 10:21 AM - Addendum   3. Tanda Rockers, MD (Physician) at 03/04/2016 10:16 AM - Signed    Plan A = Automatic = Stiolto 2 pffs each am    Plan B = Backup Only use your albuterol as a rescue medication to be used if you can't catch your breath by resting or doing a relaxed purse lip breathing pattern.  - The less you use it, the better it will work when you need it. - Ok to use the inhaler up to 2 puffs  every 4 hours if you must but call for appointment if use goes up over your usual need - Don't leave home without it !!  (think of it like the spare tire for your car)   Plan C = Crisis - only use your albuterol nebulizer if you first try Plan B and it fails to help > ok to use the nebulizer up to every 4 hours but if start needing it regularly call for immediate appointment   For cough mucinex dm up to 1200 mg every 12 hours and use the flutter valve as much as possible and supplement with Tylenol #3 up to 2 every 4 hours as needed   Prednisone 10 mg 2 each am until better then 10 mg daily    Please see patient coordinator before you leave today  to schedule PET scan and oncology eval   Come to outpatient registration at [facility] (behind the ER) at [place] March 8 with nothing to eat or drink after midnight Wednesday for Bronchoscopy and stop lovenox after Tuesday nights dose  and plavix after Tuesday am dose    03/11/2016  Day of FOB  No change in hx or exam. Mucous remains thick/ worse in am but never bloody or purulent   Christinia Gully, MD Pulmonary and [place] [facility] [phone] After 5:30 PM or weekends, use [facility] [phone]

## 2016-03-11 NOTE — Op Note (Signed)
Bronchoscopy Procedure Note  Date of Operation: 03/11/2016   Pre-op Diagnosis: recurrent lung ca  Post-op Diagnosis: ? New Primary   Surgeon: Christinia Gully  Anesthesia: Monitored Local Anesthesia with Sedation Time Started: 0817 Total of versed 5 mg IV/ Demerol 50 mg IV Time Stopped:  0840  Operation: Video Flexible fiberoptic bronchoscopy, diagnostic   Findings: surgically absent LLL sup segment/ heaped up mucosa LUL orifce  Specimen: BAL LUL/endobronchial bx LUL  Estimated Blood Loss: 25 cc max bleeding/ controlled with EPI x one amp  Complications: HBP > lopressor 2.5 mg IV   Indications and History: See updated H and P same date. The risks, benefits, complications, treatment options and expected outcomes were discussed with the patient.  The possibilities of reaction to medication, pulmonary aspiration, perforation of a viscus, bleeding, failure to diagnose a condition and creating a complication requiring transfusion or operation were discussed with the patient who freely signed the consent.    Description of Procedure: The patient was re-examined in the bronchoscopy suite and the site of surgery properly noted/marked.  The patient was identified  and the procedure verified as Flexible Fiberoptic Bronchoscopy.  A Time Out was held and the above information confirmed.   After the induction of topical nasopharyngeal anesthesia, the patient was positioned  and the bronchoscope was passed through the R naris. The vocal cords were visualized and  1% buffered lidocaine 5 ml was topically placed onto the cords. The cords were nl. The scope was then passed into the trachea.  1% buffered lidocaine given topically. Airways inspected bilaterally to the subsegmental level with the following findings:   Airways patent bilaterally to subsegmental level with probably surgically absent LLL sup segment/ heaped up mucosa LUL orifce with 75% obst of the LUL beyond the take off of the lingula, swelling  of LLL basilar takeoff x 50% but no endobronchial lesions   Interventions:  BAL LUL Endobronchial bx LUL      The Patient was taken to the Endoscopy Recovery area in satisfactory condition.  Attestation: I performed the procedure.  Christinia Gully, MD Pulmonary and [place] [phone] After 5:30 PM or weekends, call [phone]

## 2016-03-11 NOTE — Discharge Instructions (Signed)
Flexible Bronchoscopy, Care After These instructions give you information on caring for yourself after your procedure. Your doctor may also give you more specific instructions. Call your doctor if you have any problems or questions after your procedure. Follow these instructions at home:  Do not eat or drink anything for 2 hours after your procedure. If you try to eat or drink before the medicine wears off, food or drink could go into your lungs. You could also burn yourself.  After 2 hours have passed and when you can cough and gag normally, you may eat soft food and drink liquids slowly.  Hold lovenox until AM 03/12/16 but stop it any time you note active bleeding from any source  The day after the test, you may eat your normal diet.  You may do your normal activities.  Keep all doctor visits. Get help right away if:  You get more and more short of breath.  You get light-headed.  You feel like you are going to pass out (faint).  You have chest pain.  You have new problems that worry you.  You cough up more than a little blood.  You cough up more blood than before. This information is not intended to replace advice given to you by your health care provider. Make sure you discuss any questions you have with your health care provider. Document Released: 10/18/2008 Document Revised: 05/29/2015 Document Reviewed: 08/25/2012 Elsevier Interactive Patient Education  2017 [place] not eat or drink until after 1030 today 03/11/16

## 2016-03-11 NOTE — Progress Notes (Signed)
Video bronchoscopy performed Intervention bronchial washings Intervention bronchial biopsies   Kathie Dike RRT

## 2016-03-12 ENCOUNTER — Encounter (HOSPITAL_COMMUNITY)
Admission: RE | Admit: 2016-03-12 | Discharge: 2016-03-12 | Disposition: A | Discharge status: Home / Self Care | Payer: Medicare Other | Source: Ambulatory Visit | Attending: Internal Medicine | Admitting: Internal Medicine

## 2016-03-12 ENCOUNTER — Telehealth: Payer: Self-pay | Admitting: Internal Medicine

## 2016-03-12 DIAGNOSIS — C3492 Malignant neoplasm of unspecified part of left bronchus or lung: Secondary | ICD-10-CM | POA: Diagnosis not present

## 2016-03-12 LAB — GLUCOSE, CAPILLARY: GLUCOSE-CAPILLARY: 154 mg/dL — AB (ref 65–99)

## 2016-03-12 MED ORDER — FLUDEOXYGLUCOSE F - 18 (FDG) INJECTION
10.0300 | Freq: Once | INTRAVENOUS | Status: AC | PRN
  Administered 2016-03-12: 10.03 via INTRAVENOUS

## 2016-03-12 NOTE — Telephone Encounter (Signed)
Attempted to contact pt's wife. No answer, no option to leave a message. Will try back.

## 2016-03-14 ENCOUNTER — Encounter (HOSPITAL_COMMUNITY): Payer: Self-pay | Admitting: Internal Medicine

## 2016-03-15 ENCOUNTER — Ambulatory Visit: Payer: Medicare Other | Admitting: Internal Medicine

## 2016-03-15 ENCOUNTER — Other Ambulatory Visit: Payer: Self-pay | Admitting: Internal Medicine

## 2016-03-15 DIAGNOSIS — C3492 Malignant neoplasm of unspecified part of left bronchus or lung: Secondary | ICD-10-CM

## 2016-03-15 NOTE — Telephone Encounter (Signed)
Attempted to contact pt's wife x2. No answer, voicemail is not set up. Will try back.

## 2016-03-16 ENCOUNTER — Encounter: Payer: Self-pay | Admitting: Internal Medicine

## 2016-03-16 ENCOUNTER — Ambulatory Visit (INDEPENDENT_AMBULATORY_CARE_PROVIDER_SITE_OTHER): Payer: Medicare Other | Admitting: Internal Medicine

## 2016-03-16 VITALS — BP 118/70 | HR 60 | Ht 71.0 in | Wt 204.0 lb

## 2016-03-16 DIAGNOSIS — J449 Chronic obstructive pulmonary disease, unspecified: Secondary | ICD-10-CM | POA: Diagnosis not present

## 2016-03-16 DIAGNOSIS — R918 Other nonspecific abnormal finding of lung field: Secondary | ICD-10-CM | POA: Diagnosis not present

## 2016-03-16 MED ORDER — ALBUTEROL SULFATE (2.5 MG/3ML) 0.083% IN NEBU
2.5000 mg | INHALATION_SOLUTION | Freq: Once | RESPIRATORY_TRACT | Status: AC
  Administered 2016-03-16: 2.5 mg via RESPIRATORY_TRACT

## 2016-03-16 NOTE — Assessment & Plan Note (Signed)
CTa chest 02/26/16  Development of spiculated highly suspicious soft tissue mass medial to the postsurgical site from prior left upper lobe wedge resection with numerous satellite lesions in the superior lung parenchyma. Findings are consistent with primary lung cancer recurrence. Borderline mediastinal lymphadenopathy - FOB  03/11/16 >>>  75% obst LUL,  50% narrowing LLL basil segment orifice and completely obst sup segment vs surgically absent >  Pos Sq cell ca - PET 03/12/2016   1. Lung cancer recurrence within the LEFT upper lobe along the surgical improved teeth therapy treatment site. Recurrence extends from the LEFT hilum to the pleural surface. 2. 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 3. High concern for distant nodal metastasis to the LEFT axilla > referred to Rad onc/ Mohammed 03/18/16   I had an extended discussion with the patient reviewing all relevant studies completed to date and  lasting 25  minutes of a 40  Minute extended office  visit  Re dx of non-curable lung ca  Dian Situ out in graphic form the findings from FOB/ PET and explained why it is he can't just cough his way out of the congestion he feels  rec fluttter/ tylenol #3 to palliate sense of congestion from tumor   Each maintenance medication was reviewed in detail including most importantly the difference between maintenance and prns and under what circumstances the prns are to be triggered using an action plan format that is not reflected in the computer generated alphabetically organized AVS.    Please see AVS for specific instructions unique to this visit that I personally wrote and verbalized to the the pt in detail and then reviewed with pt  by my nurse highlighting any  changes in therapy recommended at today's visit to their plan of care.

## 2016-03-16 NOTE — Progress Notes (Signed)
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  Recent admit for RLL CAP , COPD exacerbation .  Tx w/ IV abx,  And discharged on omnicef .  Stay compliacted by Atrial fib with RVR. Improved with cardizem drip. Cards consult with plan to cont mutltaq and pradaxa.  Pt reports is 85% improved since discharge. Still having some lingering wheezing.   Denies any increased SOB, tightness, f/c/s, hemoptysis, nausea, vomiting.  finished abx this morning.  hasn't smoked since 5/10-encouraged on smoking cessation.  On ACE i   rec Continue on current regimen.  Take Spiriva daily  Great job on not smoking .  Mucinex DM Twice daily  As needed  Cough/congestion  Please contact office for sooner follow up if symptoms do not improve or worsen or seek emergency care     11/20/2014  Consultation/Kaleiyah Polsky re: active smoker/ GOLD II copd  Not on active meds  Chief Complaint  Patient presents with  . Pulmonary Consult    Former patient of Dr Joya Gaskins. Pt needing DOT clearance. Pt states that his breathing is doing well. He denies any co's today.    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    Stop lisinopril and start ibesartan 75 mg daily - ok to break in half if too strong and just take a half pill daily      10/17/2015 acute extended ov/Collins Kerby re:  GOLD II CPD no maint rx   Chief Complaint  Patient presents with  . Follow-up    Pt states having increased DOE x 3-4 months. He states he gets SOB with any exertion at all, esp walking an incline. He has some chest tightness on exertion. He has occ cough with clear sputum.    unsure of meds/ took ? One pfff symb this am with some relief, sleeping fine on cpap   Indolent onset/progressive x 3-4 m Doe = MMRC3 = can't walk 100 yards even at a slow pace at a flat grade s stopping due to sob   rec symbicort 160 Take 2 puffs first thing in am and then another 2 puffs about 12 hours later.  Pantoprazole (protonix) 40 mg   Take  30-60 min before first meal of the day and Pepcid (famotidine)  20 mg one @  bedtime until return to office - this is the best way to tell whether stomach acid is contributing to your problem.  GERD         11/25/2015  f/u ov/Remer Couse re: copd II/ copd still smoking/ really confused with details of care  Chief Complaint  Patient presents with  . Follow-up    4wk rov - Pt c/o cough x 1 week. Thick mucus production, white in color. Denies CP/chest tightness and wheezing. Using Robitussin OTC for cough and Mucinex DM.  Little relief   again unsure of meds, not clear whether even ever used symb samples/ did not fill rx Cough is worst first thing in am but even then just white,  No purulent sputum or mucus plugs    Doe continues = MMRC3  rec Pantoprazole (protonix) 40 mg   Take  30-60 min before first meal of the day and Pepcid (famotidine)  20 mg one @  bedtime until return to office - this is the best way to tell whether stomach acid is contributing to your problem.   Stiolto 2 pffs each am  Work on inhaler technique: Prednisone 10 mg take  4 each am x 2 days,   2 each am x 2 days,  1 each am x 2 days and stop   For cough mucinex dm up to 1200 mg every 12 hours  The key is to stop smoking completely before smoking completely stops you! Please schedule a follow up office visit in 2 weeks, sooner if needed with all active medications in hand   12/16/2015  f/u ov/Rishika Mccollom re: copd II/ quit  Smoking since last ov / stiolto daily  Chief Complaint  Patient presents with  . Follow-up    Cough has slightly improved. Cough is occ prod in the am's with clear sputum.  His breathing has improved.   doe =  MMRC3  Minimal am mucus / clears easily s noct disturbance Thinks overall better on stiolto/ no striking resp to pred  "took all my iron" no clear plan for f/u per pt (see last GI note which suggests otherwise)  rec Please remember to go to the lab  department downstairs for your tests -  Your Iron and Iron deficiency needs to continue to be followed in the [facility] 2 each am and call for follow up as needed if not happy with your breathing     02/25/2016  f/u ov/Reginald Weida re:  GOLD II copd/ still smoking  Chief Complaint  Patient presents with  . Acute Visit    Pt c/o increased SOB and cough over the past month. Cough is prod with thick, clear sputum.  He was seen by PCP 2 wks ago and was txed with Amoxicillin and Pred and this gave no relief. He also c/o wheezing.   cough is worse in shower  cpap hs works fine  MMRC3 = can't walk 100 yards even at a slow pace at a flat grade s stopping due to sob   Last time could do HT walking  was Fall  2017  rec Pantoprazole (protonix) 40 mg   Take  30-60 min before first meal of the day and Pepcid (famotidine)  20 mg one @  bedtime until return to office - this is the best way to tell whether stomach acid is contributing to your problem.   Continue stiolto 2 pffs first thing am each am  Work on inhaler technique:  relax and gently blow all the way out then take a nice smooth deep breath back in, triggering the inhaler at same time you start breathing in.  Hold for up to 5 seconds if you can. Blow out thru nose. Rinse and gargle with water when done.  Only use your albuterol as a rescue medication For cough mucinex dm up to 1200 mg every 12 hours and use the flutter valve as much as possible  The key is to stop smoking completely before smoking completely stops you!  cxr was abn > See CTa 02/26/16  C/w recurrent ca and PE's > rx lovenox  Venous dopplers 03/02/2016 > neg bilaterally      03/04/2016 extended  f/u ov/Saddie Sandeen re: copd II/ quit smoking 02/27/16 / pe's/ recurrent ca  Chief Complaint  Patient presents with  . Follow-up    Cough has become worse since last ov- producing some clear sputum. He is also feeling more SOB. He states he is wheezing "all the time".    maint on stiolto / no better with saba Does some better when on pred vs off   rec Plan A = Automatic = Stiolto 2 pffs each am  Plan B = Backup Only use your albuterol as a rescue medication For cough mucinex dm up to 1200 mg every 12 hours and use the flutter valve as much as possible and supplement with Tylenol #3 up to 2 every 4 hours as needed  Prednisone 10 mg 2 each am until better then 10 mg daily  Please see patient coordinator before you leave today  to schedule PET scan and oncology eval  Come to outpatient registration at [facility]behind the ER[facility] at [place] March 8     FOB  03/11/16 >>>  75% obst LUL,  50% narrowing LLL basil segment orifice and completely obst sup segment vs surgically absent >  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 Follow-up    Discuss results of Bronch.   using alb 2 pffs maybe two puffs total per day but did not bring it with him  Prednisone 10 mg 2 each am  Tylenol #3  Maybe 2 daily  Lovenox 90 mg daily (instructions say bid)  Doe = MMRC4  = sob if tries to leave home or  while getting dressed    No obvious day to day or daytime variability or assoc purulent sputum mucus plugs or hemoptysis or cp or chest tightness, subjective wheeze or overt sinus or hb symptoms. No unusual exp hx or h/o childhood pna/ asthma or knowledge of premature birth.  Sleeping ok without nocturnal  or early am exacerbation  of respiratory  c/o's or need for noct saba. Also denies any obvious fluctuation of symptoms with weather or environmental changes or other aggravating or alleviating factors except as outlined above   Current Medications, Allergies, Complete Past Medical History, Past Surgical History, Family History, and Social History were reviewed in [employer] record.  ROS  The following are not active complaints unless bolded sore throat, dysphagia, dental problems, itching, sneezing,  nasal congestion or excess/ purulent secretions, ear ache,   fever, chills, sweats, unintended wt loss, classically pleuritic or exertional cp,  orthopnea pnd or leg swelling, presyncope, palpitations, abdominal pain, anorexia, nausea, vomiting, diarrhea  or change in bowel or bladder habits, change in stools or urine, dysuria,hematuria,  rash, arthralgias, visual complaints, headache, numbness, weakness or ataxia or problems with  walking or coordination,  change in mood/affect or memory.               Objective:   Physical Exam   GEN: A/Ox3;   NAD, elderly wm  Who appears chronically but not acutely  ill     03/16/2016        204  02/25/2016        202  12/16/2015      201  11/25/2015      190  10/17/2015     205  11/20/14 201 lb (91.173 kg)  11/20/14 200 lb (90.719 kg)  04/29/14 207 lb (93.895 kg)    Vital signs reviewed -  - Note on arrival 02 sats  94% on RA      HEENT:  [place]/AT,  EACs-clear, TMs-wnl, NOSE-clear drainage  THROAT-clear, no lesions, no postnasal drip or exudate noted.   NECK:  Supple w/ fair ROM; no JVD; normal carotid impulses w/o bruits; no  thyromegaly or nodules palpated; no lymphadenopathy.    RESP  Diminished BS in bases ,  Bilateral insp and exp rhonchi most  prominent L side with lots of upper airway features   CARD:  RRR, no m/r/g  , no peripheral edema, pulses intact, no cyanosis or clubbing.  GI:  Mod obese/  Soft & nt; nml bowel sounds; no organomegaly or masses detected. / pos hoover's at end insp   Musco: Warm bil, no deformities or joint swelling noted.   Neuro: alert, no focal deficits noted.    Skin: Warm, no lesions or rashes                      Assessment & Plan:

## 2016-03-16 NOTE — Patient Instructions (Addendum)
Plan A = Automatic = Stiolto 2 pffs each am   Plan B = Backup Only use your albuterol as a rescue medication to be used if you can't catch your breath by resting or doing a relaxed purse lip breathing pattern.  - The less you use it, the better it will work when you need it. - Ok to use the inhaler up to 2 puffs  every 4 hours if you must but call for appointment if use goes up over your usual need - Don't leave home without it !!  (think of it like the spare tire for your car)     For cough mucinex dm up to 1200 mg every 12 hours and use the flutter valve as much as possible and supplement with Tylenol #3 up to 2 every 4 hours as needed   Prednisone 10 mg 2 each am until better then 10 mg daily   GERD (REFLUX)  is an extremely common cause of respiratory symptoms just like yours , many times with no obvious heartburn at all.    It can be treated with medication, but also with lifestyle changes including elevation of the head of your bed (ideally with 6 inch  bed blocks),  Smoking cessation, avoidance of late meals, excessive alcohol, and avoid fatty foods, chocolate, peppermint, colas, red wine, and acidic juices such as orange juice.  NO MINT OR MENTHOL PRODUCTS SO NO COUGH DROPS  USE SUGARLESS CANDY INSTEAD (Jolley ranchers or Stover's or Life Savers) or even ice chips will also do - the key is to swallow to prevent all throat clearing. NO OIL BASED VITAMINS - use powdered substitutes.  Once you establish with Dr Earlie Server he can refill your medications and I can see you as needed for coughing or breathing problems which should improve once you start treatment

## 2016-03-17 ENCOUNTER — Telehealth: Payer: Self-pay | Admitting: *Deleted

## 2016-03-17 NOTE — Telephone Encounter (Signed)
Oncology Nurse Navigator Documentation  Oncology Nurse Navigator Flowsheets 03/17/2016  Navigator Location [facility]  Navigator Encounter Type Telephone/I called to remind patient about [place] appt tomorrow.  He verbalized understanding of appt time and place.   Telephone Outgoing Call  Treatment Phase Abnormal Scans  Barriers/Navigation Needs Education  Education Other  Interventions Education  Education Method Verbal  Acuity Level 1  Time Spent with Patient 15

## 2016-03-17 NOTE — Telephone Encounter (Signed)
lmtcb for pt. Contact home number to leave VM.

## 2016-03-18 ENCOUNTER — Ambulatory Visit: Payer: Medicare Other | Attending: Internal Medicine | Admitting: Physical Therapy

## 2016-03-18 ENCOUNTER — Ambulatory Visit (HOSPITAL_BASED_OUTPATIENT_CLINIC_OR_DEPARTMENT_OTHER): Payer: Medicare Other | Admitting: Internal Medicine

## 2016-03-18 ENCOUNTER — Encounter: Payer: Self-pay | Admitting: Internal Medicine

## 2016-03-18 ENCOUNTER — Ambulatory Visit
Admission: RE | Admit: 2016-03-18 | Discharge: 2016-03-18 | Disposition: A | Discharge status: Home / Self Care | Payer: Medicare Other | Source: Ambulatory Visit | Attending: Radiation Oncology | Admitting: Radiation Oncology

## 2016-03-18 ENCOUNTER — Telehealth: Payer: Self-pay | Admitting: Internal Medicine

## 2016-03-18 ENCOUNTER — Encounter: Payer: Self-pay | Admitting: *Deleted

## 2016-03-18 ENCOUNTER — Other Ambulatory Visit (HOSPITAL_BASED_OUTPATIENT_CLINIC_OR_DEPARTMENT_OTHER): Payer: Medicare Other

## 2016-03-18 VITALS — BP 123/69 | HR 69 | Temp 98.3°F | Resp 18 | Ht 71.0 in | Wt 202.3 lb

## 2016-03-18 DIAGNOSIS — I2699 Other pulmonary embolism without acute cor pulmonale: Secondary | ICD-10-CM

## 2016-03-18 DIAGNOSIS — Z7901 Long term (current) use of anticoagulants: Secondary | ICD-10-CM | POA: Diagnosis not present

## 2016-03-18 DIAGNOSIS — R262 Difficulty in walking, not elsewhere classified: Secondary | ICD-10-CM | POA: Diagnosis not present

## 2016-03-18 DIAGNOSIS — C3492 Malignant neoplasm of unspecified part of left bronchus or lung: Secondary | ICD-10-CM

## 2016-03-18 DIAGNOSIS — C3412 Malignant neoplasm of upper lobe, left bronchus or lung: Secondary | ICD-10-CM

## 2016-03-18 DIAGNOSIS — Z5111 Encounter for antineoplastic chemotherapy: Secondary | ICD-10-CM | POA: Insufficient documentation

## 2016-03-18 DIAGNOSIS — Z7189 Other specified counseling: Secondary | ICD-10-CM | POA: Insufficient documentation

## 2016-03-18 DIAGNOSIS — J449 Chronic obstructive pulmonary disease, unspecified: Secondary | ICD-10-CM

## 2016-03-18 DIAGNOSIS — R2681 Unsteadiness on feet: Secondary | ICD-10-CM | POA: Diagnosis present

## 2016-03-18 DIAGNOSIS — I10 Essential (primary) hypertension: Secondary | ICD-10-CM

## 2016-03-18 HISTORY — DX: Malignant neoplasm of unspecified part of left bronchus or lung: C34.92

## 2016-03-18 LAB — CBC WITH DIFFERENTIAL/PLATELET
BASO%: 0.2 % (ref 0.0–2.0)
Basophils Absolute: 0 10*3/uL (ref 0.0–0.1)
EOS%: 0.4 % (ref 0.0–7.0)
Eosinophils Absolute: 0.1 10*3/uL (ref 0.0–0.5)
HEMATOCRIT: 44.6 % (ref 38.4–49.9)
HGB: 14.1 g/dL (ref 13.0–17.1)
LYMPH#: 0.9 10*3/uL (ref 0.9–3.3)
LYMPH%: 5.7 % — ABNORMAL LOW (ref 14.0–49.0)
MCH: 25.6 pg — ABNORMAL LOW (ref 27.2–33.4)
MCHC: 31.6 g/dL — ABNORMAL LOW (ref 32.0–36.0)
MCV: 81.1 fL (ref 79.3–98.0)
MONO#: 0.6 10*3/uL (ref 0.1–0.9)
MONO%: 3.6 % (ref 0.0–14.0)
NEUT%: 90.1 % — ABNORMAL HIGH (ref 39.0–75.0)
NEUTROS ABS: 14.7 10*3/uL — AB (ref 1.5–6.5)
Platelets: 189 10*3/uL (ref 140–400)
RBC: 5.5 10*6/uL (ref 4.20–5.82)
RDW: 16.2 % — ABNORMAL HIGH (ref 11.0–14.6)
WBC: 16.3 10*3/uL — AB (ref 4.0–10.3)

## 2016-03-18 LAB — TECHNOLOGIST REVIEW

## 2016-03-18 LAB — COMPREHENSIVE METABOLIC PANEL
ALT: 15 U/L (ref 0–55)
ANION GAP: 10 meq/L (ref 3–11)
AST: 10 U/L (ref 5–34)
Albumin: 3.7 g/dL (ref 3.5–5.0)
Alkaline Phosphatase: 84 U/L (ref 40–150)
BILIRUBIN TOTAL: 0.94 mg/dL (ref 0.20–1.20)
BUN: 16.6 mg/dL (ref 7.0–26.0)
CHLORIDE: 102 meq/L (ref 98–109)
CO2: 27 meq/L (ref 22–29)
Calcium: 9.9 mg/dL (ref 8.4–10.4)
Creatinine: 1.2 mg/dL (ref 0.7–1.3)
EGFR: 56 mL/min/{1.73_m2} — AB (ref >90)
GLUCOSE: 221 mg/dL — AB (ref 70–140)
POTASSIUM: 4.7 meq/L (ref 3.5–5.1)
SODIUM: 139 meq/L (ref 136–145)
Total Protein: 7 g/dL (ref 6.4–8.3)

## 2016-03-18 MED ORDER — HYDROCODONE-HOMATROPINE 5-1.5 MG/5ML PO SYRP: 5.0000 mL | ORAL_SOLUTION | Freq: Four times a day (QID) | ORAL | 0 refills | Status: DC | PRN

## 2016-03-18 MED ORDER — PROCHLORPERAZINE MALEATE 10 MG PO TABS: 10.0000 mg | ORAL_TABLET | Freq: Four times a day (QID) | ORAL | 0 refills | Status: DC | PRN

## 2016-03-18 NOTE — Therapy (Signed)
[place], [place], [address] Phone: [phone]   Fax:  [phone]  Physical Therapy Evaluation  Patient Details  Name: Travis [name] MRN: [id number] Date of Birth: 05/19/1940 Referring Provider: Dr. Curt Bears  Encounter Date: 03/18/2016      PT End of Session - 03/18/16 1505    Visit Number 1   Number of Visits 1   PT Start Time 1406   PT Stop Time 1427   PT Time Calculation (min) 21 min   Activity Tolerance Patient tolerated treatment well   Behavior During Therapy [facility] for tasks assessed/performed      Past Medical History:  Diagnosis Date  . AAA family hx 11/20/2014  . CAD (coronary artery disease)    s/p RCA atherectomy 1994, cath 1999 with occluded RCA with left to right collaterals, PCI of LAD 2007, s/p PCI of LAD for instent thrombosis 2008, PCI 08/2015 with DES to LAD due to ISR with known L-R collaterals to RCA  . Dyslipidemia   . Encounter for antineoplastic chemotherapy 03/18/2016  . Goals of care, counseling/discussion 03/18/2016  . Hyperlipidemia   . Hypertension   . Insomnia   . Lung cancer [facility])    s/p lobectomy, chemo and radiation therapy  . OSA (obstructive sleep apnea)    intolerant to CPAP  . PAF (paroxysmal atrial fibrillation) ([place])   . Psoriasis   . Squamous cell carcinoma of lung, stage III, left ([place]) 03/18/2016  . Tobacco abuse     Past Surgical History:  Procedure Laterality Date  . APPENDECTOMY    . CARDIAC CATHETERIZATION  1999  . CARDIAC CATHETERIZATION N/A 08/22/2015   Procedure: Left Heart Cath and Coronary Angiography;  Surgeon: Leonie Man, MD;  Location: [place] CV LAB;  Service: Cardiovascular;  Laterality: N/A;  . CARDIAC CATHETERIZATION N/A 08/22/2015   Procedure: Coronary Stent Intervention;  Surgeon: Leonie Man, MD;  Location: [place] CV LAB;  Service: Cardiovascular;  Laterality: N/A;  . EXPLORATORY LAPAROTOMY      secondary to trauma  . multiple PCT's to RCA and LAD  2007/2008  . s/p lung lobectomy  2007  . VIDEO BRONCHOSCOPY N/A 08/28/2012   Procedure: VIDEO BRONCHOSCOPY WITHOUT FLUORO;  Surgeon: Elsie Stain, MD;  Location: [place];  Service: Cardiopulmonary;  Laterality: N/A;  . VIDEO BRONCHOSCOPY Bilateral 03/11/2016   Procedure: VIDEO BRONCHOSCOPY WITHOUT FLUORO;  Surgeon: Tanda Rockers, MD;  Location: [facility];  Service: Cardiopulmonary;  Laterality: Bilateral;    There were no vitals filed for this visit.       Subjective Assessment - 03/18/16 1454    Subjective Reports he can't do anything physical because of weakness (shortness of breath) with any activity.   Patient is accompained by: Family member  wife   Pertinent History Pt. had been followed due to h/o squamous cell carcinoma, s/p left upper lobectomy and radiation seed implant in 2008. Recent scans showed left upper lobe 4.3 cm. mass as well as metabolic activity in mediastinal nodes and left axillary node.  Diagnosis now is stage III or IV squamous cell carcinoma. Plan is to biopsy the axillary node to get more tissue, or possible re-bronchoscopy, then either chemotherapy or chemoradiation.  He has never done pulmonary rehab.  Ex-smoker who quit 02/2016, CAD, COPD.   Patient Stated Goals get info from all clinic providers   Currently in Pain? No/denies            [facility]  PT Assessment - 03/18/16 0001      Assessment   Medical Diagnosis left upper lobe squamous cell CA, stage III or IV   Referring Provider Dr. Curt Bears   Onset Date/Surgical Date 02/26/16   Prior Therapy none     Precautions   Precautions Other (comment)   Precaution Comments cancer precautions     Restrictions   Weight Bearing Restrictions No     Balance Screen   Has the patient fallen in the past 6 months No   Has the patient had a decrease in activity level because of a fear of falling?  No   Is the patient reluctant to leave their  home because of a fear of falling?  No     Home Environment   Living Environment Private residence   Living Arrangements Spouse/significant other   Type of [place] Two level;Able to live on main level with bedroom/bathroom     Prior Function   Level of Independence Independent   Leisure no exercise; he feels unable to do any due to SOB     Cognition   Overall Cognitive Status Within Functional Limits for tasks assessed     Observation/Other Assessments   Observations tremor seen in arms, noted with signing his name     Functional Tests   Functional tests Sit to Stand     Sit to Stand   Comments 10 times in 30 seconds with moderate + dyspnea following; he felt this was difficult  just below average for his age     Posture/Postural Control   Posture/Postural Control Postural limitations   Postural Limitations Forward head     ROM / Strength   AROM / PROM / Strength AROM     AROM   Overall AROM Comments standing trunk AROM WFL except sidebend slightly limited bilat.     Ambulation/Gait   Ambulation/Gait Yes   Ambulation/Gait Assistance 6: Modified independent (Device/Increase time)  no assistive device   Gait Comments reports he can't walk uphill and distance and speed are limited by SOB     Balance   Balance Assessed Yes     Dynamic Standing Balance   Dynamic Standing - Comments reaches forward 10 inches in standing, below average for his age                           PT Education - 03/18/16 1504    Education provided Yes   Education Details energy conservation, starting a walking program, Cure article on staying active through treatment, posture, breathing, PT info   Person(s) Educated Patient;Spouse   Methods Explanation;Handout   Comprehension Verbalized understanding               Lung Clinic Goals - 03/18/16 1509      Patient will be able to verbalize understanding of the benefit of exercise to decrease fatigue.    Status Achieved     Patient will be able to verbalize the importance of posture.   Status Achieved     Patient will be able to demonstrate diaphragmatic breathing for improved lung function.   Status Achieved     Patient will be able to verbalize understanding of the role of physical therapy to prevent functional decline and who to contact if physical therapy is needed.   Status Achieved             Plan - 03/18/16 1505  Clinical Impression Statement This is a gentleman who appears distressed, accompanied by his wife.  He reports his physical activity is significantly limited by SOB.  He does show moderate dyspnea with 30-second sit to stand, but is able to do 10 reps, average for age.  He his slightly limited trunk ROM.  Forward reach in standing is below average.  He has an arm tremor.  He has a h/o COPD, smoking, and treatment for lung cancer in 19-Mar-2006.  For these reasons, and because status is evolving with new diagnosis of lung cancer, his eval is moderate complexity.   Rehab Potential Fair   PT Frequency One time visit   PT Treatment/Interventions Patient/family education   PT Next Visit Plan None at this time; patient would benefit from pulmonary rehab if he does well with cancer treatment.   PT Home Exercise Plan walking, breathing exercise   Consulted and Agree with Plan of Care Patient      Patient will benefit from skilled therapeutic intervention in order to improve the following deficits and impairments:  Cardiopulmonary status limiting activity, Decreased activity tolerance, Decreased mobility, Decreased range of motion, Postural dysfunction  Visit Diagnosis: Difficulty in walking, not elsewhere classified - Plan: PT plan of care cert/re-cert  Unsteadiness on feet - Plan: PT plan of care cert/re-cert      G-Codes - 26/37/85 03-19-1507    Functional Assessment Tool Used (Outpatient Only) clinical judgement   Functional Limitation Mobility: Walking and moving around   endurance   Mobility: Walking and Moving Around Current Status (Y8502) At least 40 percent but less than 60 percent impaired, limited or restricted   Mobility: Walking and Moving Around Goal Status [phone]) At least 40 percent but less than 60 percent impaired, limited or restricted   Mobility: Walking and Moving Around Discharge Status [id number]) At least 40 percent but less than 60 percent impaired, limited or restricted       Problem List Patient Active Problem List   Diagnosis Date Noted  . Squamous cell carcinoma of lung, stage III, left ([place]) 03/18/2016  . Goals of care, counseling/discussion 03/18/2016  . Encounter for antineoplastic chemotherapy 03/18/2016  . Mass of left lung 03/04/2016  . Acute pulmonary embolism ([place]) 03/02/2016  . Cough 02/26/2016  . AAA (abdominal aortic aneurysm) ([place]) 11/04/2015  . Iron deficiency anemia 10/25/2015  . Shortness of breath 10/17/2015  . Chronic anticoagulation-Xarelto 08/23/2015  . Angina, class III ([place]) 08/22/2015  . Abnormal nuclear stress test 08/13/15  08/22/2015  . Morbid obesity ([place]) 11/21/2014  . AAA family hx 11/20/2014  . Absent pulse in lower extremity 11/20/2014  . Encounter for smoking cessation counseling 11/20/2014  . CAP (community acquired pneumonia) 05/14/2013  . CAD S/P multiple PCIs   . COPD GOLD II  09/16/2010  . Cigarette smoker 09/16/2010  . PAF (paroxysmal atrial fibrillation) ([place])   . Essential hypertension   . Dyslipidemia   . OSA (obstructive sleep apnea)   . Lung cancer ([place])     [place] 03/18/2016, 3:12 PM  [place], [place], [address] Phone: [phone]   Fax:  [phone]  Name: Travis [name] MRN: [id number] Date of Birth: 10/18/1940  Serafina Royals, PT 03/18/16 3:12 PM

## 2016-03-18 NOTE — Telephone Encounter (Signed)
Gave patient AVS and scheduled appts per 03/18/2016 los. [facility] to contact patient with MRI. Talked to [place] at IR , referral has gone through to [facility] [place] scheduling and they will follow up with the patient. Chemo edu class scheduled per 03/18/2016 los.

## 2016-03-18 NOTE — Progress Notes (Signed)
START ON PATHWAY REGIMEN - Non-Small Cell Lung     Administer weekly:     Paclitaxel      Carboplatin   **Always confirm dose/schedule in your pharmacy ordering system**  Patient Characteristics: Stage III - Unresectable, PS = 0, 1 AJCC T Category: T1b Current Disease Status: No Distant Mets or Local Recurrence AJCC N Category: N2 AJCC M Category: M0 AJCC 8 Stage Grouping: IIIA Performance Status: PS = 0, 1 Intent of Therapy: Curative Intent, Discussed with Patient 

## 2016-03-18 NOTE — Progress Notes (Signed)
[place] Telephone:([phone]) [phone]   [phone] Multidisciplinary thoracic oncology clinic  CONSULT NOTE  REFERRING PHYSICIAN: Dr. Christinia Gully  REASON FOR CONSULTATION:  76 years old white male recently diagnosed with lung cancer.  HPI Travis [name] is a 76 y.o. male with past medical history significant for dyslipidemia, coronary artery disease, psoriasis, borderline diabetes mellitus as well as history of stage IA non-small cell lung cancer, squamous cell carcinoma status post left upper lobe wedge resection with seed implants under the care of Dr. Arlyce Dice on 11/09/2005. The patient has been on observation for the last 10 years. Over the last 4 months he has been complaining of increasing cough and shortness of breath with no improvement on antibiotics. Chest x-ray on 02/26/2016 showed increasing density in the left midlung in the region of the previous surgery and radiation raising concerns about disease recurrence. CT angiogram of the chest was performed on 02/26/2016 and it showed an irregular soft tissue mass in the left hemithorax at the bifurcation of the left lower and lingular bronchus. The mass causes narrowing of the bronchi and gait transversing lower lobe pulmonary artery. The mass is medial and superior to the prior postsurgical/posttreatment site from left upper lobe wedge resection and it measured 5.9 x 4.9 x 6.5 cm. There are numerous is spiculated pulmonary nodules seen superior to the index mass and consistent with satellite lesions. There was also borderline mediastinal lymphadenopathy. On 03/11/2016 the patient underwent bronchoscopy under the care of Dr. Melvyn Novas. The final pathology ([id number]) showed squamous cell carcinoma. The PET scan on 03/12/2016 showed lung cancer recurrence within the left upper lobe along the surgical and seed implant treatment site. The recurrence extends from the left hilum to the pleural surface. There was a small  intensely hypermetabolic mediastinal lymph nodes consistent with mediastinal nodal metastasis. There was also high concern for distant nodal metastasis to the left axilla. Dr. Melvyn Novas kindly referred the patient to the multidisciplinary thoracic oncology clinic today for further evaluation and recommendation regarding treatment of his condition. When seen today the patient continues to have cough productive of clear sputum and shortness of breath. He also has some tightness in the chest but no hemoptysis. He denied having any weight loss or night sweats. He has no nausea, vomiting, diarrhea or constipation. He denied having any headache or visual changes. Family history significant for mother and father with heart disease. Two Brother had throat cancer and a sister had breast cancer. The patient is married and has 3 children. He was accompanied by his wife, Travis [name]. He used to work in [employer]. He has a history of smoking more than one pack per day for around 60 years and he quit few months ago. He also drinks alcohol occasionally and no history of drug abuse.  HPI  Past Medical History:  Diagnosis Date  . AAA family hx 11/20/2014  . CAD (coronary artery disease)    s/p RCA atherectomy 1994, cath 1999 with occluded RCA with left to right collaterals, PCI of LAD 2007, s/p PCI of LAD for instent thrombosis 2008, PCI 08/2015 with DES to LAD due to ISR with known L-R collaterals to RCA  . Dyslipidemia   . Hyperlipidemia   . Hypertension   . Insomnia   . Lung cancer [facility])    s/p lobectomy, chemo and radiation therapy  . OSA (obstructive sleep apnea)    intolerant to CPAP  . PAF (paroxysmal atrial fibrillation) ([place])   . Psoriasis   .  Tobacco abuse     Past Surgical History:  Procedure Laterality Date  . APPENDECTOMY    . CARDIAC CATHETERIZATION  1999  . CARDIAC CATHETERIZATION N/A 08/22/2015   Procedure: Left Heart Cath and Coronary Angiography;  Surgeon: Leonie Man, MD;  Location: [place] [facility];  Service: Cardiovascular;  Laterality: N/A;  . CARDIAC CATHETERIZATION N/A 08/22/2015   Procedure: Coronary Stent Intervention;  Surgeon: Leonie Man, MD;  Location: [place] [facility];  Service: Cardiovascular;  Laterality: N/A;  . EXPLORATORY LAPAROTOMY     secondary to trauma  . multiple PCT's to RCA and LAD  2007/2008  . s/p lung lobectomy  2007  . VIDEO BRONCHOSCOPY N/A 08/28/2012   Procedure: VIDEO BRONCHOSCOPY WITHOUT FLUORO;  Surgeon: Elsie Stain, MD;  Location: [place];  Service: Cardiopulmonary;  Laterality: N/A;  . VIDEO BRONCHOSCOPY Bilateral 03/11/2016   Procedure: VIDEO BRONCHOSCOPY WITHOUT FLUORO;  Surgeon: Tanda Rockers, MD;  Location: [facility];  Service: Cardiopulmonary;  Laterality: Bilateral;    Family History  Problem Relation Age of Onset  . Heart attack Father   . Heart disease Mother   . Heart attack Mother   . Coronary artery disease Brother   . Lymphoma Brother   . Breast cancer Sister   . Diabetes Sister   . Heart disease Sister   . Colon cancer Neg Hx   . Stomach cancer Neg Hx     Social History Social History  Substance Use Topics  . Smoking status: Former Smoker    Packs/day: 0.75    Years: 58.00    Types: Cigarettes    Quit date: 02/27/2016  . Smokeless tobacco: Former Systems developer    Types: Chew    Quit date: 07/04/2012  . Alcohol use No    No Known Allergies  Current Outpatient Prescriptions  Medication Sig Dispense Refill  . acetaminophen-codeine (TYLENOL #3) 300-30 MG tablet One every 4 hours as needed for cough (Patient taking differently: Take 1 tablet by mouth every 4 (four) hours as needed (cough). ) 40 tablet 0  . albuterol (PROAIR HFA) 108 (90 Base) MCG/ACT inhaler Inhale 2 puffs into the lungs every 4 (four) hours as needed for wheezing or shortness of breath.     . clopidogrel (PLAVIX) 75 MG tablet Take 1 tablet (75 mg total) by mouth daily with breakfast. 90 tablet 3  . dextromethorphan-guaiFENesin  (MUCINEX DM) 30-600 MG 12hr tablet Take 2 tablets by mouth 2 (two) times daily as needed for cough.     . enoxaparin (LOVENOX) 150 MG/ML injection Inject 0.61 mLs (90 mg total) into the skin every 12 (twelve) hours. (Patient taking differently: Inject 90 mg into the skin. Daily at 9pm) 14 Syringe 1  . famotidine (PEPCID) 20 MG tablet One at bedtime    . ferrous sulfate 325 (65 FE) MG tablet Take 325 mg by mouth at bedtime.     . isosorbide mononitrate (IMDUR) 30 MG 24 hr tablet Take 1 tablet (30 mg total) by mouth daily. (Patient taking differently: Take 30 mg by mouth at bedtime. ) 90 tablet 3  . MULTAQ 400 MG tablet TAKE 1 TABLET BY MOUTH TWICE A DAY 30 (Patient taking differently: TAKE 1 TABLET BY MOUTH TWICE A DAY) 60 tablet 10  . nitroGLYCERIN (NITROSTAT) 0.4 MG SL tablet Place 0.4 mg under the tongue every 5 (five) minutes as needed for chest pain. X 3 doses    . pantoprazole (PROTONIX) 40 MG tablet Take 1 tablet (  40 mg total) by mouth daily. 90 tablet 3  . predniSONE (DELTASONE) 10 MG tablet Take  2 each am until better then  1 each am (Patient taking differently: Take '20mg'$  in the morning) 100 tablet 0  . rosuvastatin (CRESTOR) 10 MG tablet Take 1 tablet (10 mg total) by mouth daily. (Patient taking differently: Take 10 mg by mouth at bedtime. ) 90 tablet 3  . Tiotropium Bromide-Olodaterol (STIOLTO RESPIMAT) 2.5-2.5 MCG/ACT AERS Inhale 2 puffs into the lungs daily. 1 Inhaler 0  . traZODone (DESYREL) 100 MG tablet Take 200 mg by mouth at bedtime.      No current facility-administered medications for this visit.     Review of Systems  Constitutional: positive for fatigue Eyes: negative Ears, nose, mouth, throat, and face: negative Respiratory: positive for cough, dyspnea on exertion and sputum Cardiovascular: negative Gastrointestinal: negative Genitourinary:negative Integument/breast: negative Hematologic/lymphatic: negative Musculoskeletal:negative Neurological:  negative Behavioral/Psych: negative Endocrine: negative Allergic/Immunologic: negative  Physical Exam  OZH:YQMVH, healthy, no distress, well nourished and well developed SKIN: skin color, texture, turgor are normal, no rashes or significant lesions HEAD: Normocephalic, No masses, lesions, tenderness or abnormalities EYES: normal, PERRLA, Conjunctiva are pink and non-injected EARS: External ears normal, Canals clear OROPHARYNX:no exudate, no erythema and lips, buccal mucosa, and tongue normal  NECK: supple, no adenopathy, no JVD LYMPH:  no palpable lymphadenopathy, no hepatosplenomegaly LUNGS: clear to auscultation , and palpation HEART: regular rate & rhythm, no murmurs and no gallops ABDOMEN:abdomen soft, non-tender, normal bowel sounds and no masses or organomegaly BACK: Back symmetric, no curvature., No CVA tenderness EXTREMITIES:no joint deformities, effusion, or inflammation, no edema, no skin discoloration  NEURO: alert & oriented x 3 with fluent speech, no focal motor/sensory deficits  PERFORMANCE STATUS: ECOG 1  LABORATORY DATA: Lab Results  Component Value Date   WBC 16.3 (H) 03/18/2016   HGB 14.1 03/18/2016   HCT 44.6 03/18/2016   MCV 81.1 03/18/2016   PLT 189 03/18/2016      Chemistry      Component Value Date/Time   NA 137 02/26/2016 1650   K 4.4 02/26/2016 1650   CL 105 02/26/2016 1650   CO2 24 02/26/2016 1650   BUN 13 02/26/2016 1650   CREATININE 1.09 02/26/2016 1650   CREATININE 1.09 09/02/2015 0900      Component Value Date/Time   CALCIUM 9.0 02/26/2016 1650   ALKPHOS 62 09/02/2015 0900   AST 12 09/02/2015 0900   ALT 9 09/02/2015 0900   BILITOT 0.5 09/02/2015 0900       RADIOGRAPHIC STUDIES: Dg Chest 2 View  Result Date: 02/26/2016 CLINICAL DATA:  Cough and shortness of breath over the last 2 months. Previous treatment for lung cancer. EXAM: CHEST  2 VIEW COMPARISON:  10/17/2015 FINDINGS: Heart size upper limits of normal. Mediastinal shadows  are normal. The right lung remains clear except for mild scarring at the base. Previous surgery and radiation in the left mid chest. There is increasing density at the site of treatment that raises some concern. This could represent progressive post treatment scarring, but recurrent disease is not excluded based on today's exam. No effusion. No acute bony finding. Post treatment changes of the left ribcage as seen previously. IMPRESSION: Increasing density in the left mid lung in the region of previous surgery and radiation. This raises concern. Whereas this could represent benign post treatment scarring, recurrent tumor is not excluded at this point. Electronically Signed   By: Nelson Chimes M.D.   On: 02/26/2016 08:09  Ct Angio Chest Pe W Or Wo Contrast  Result Date: 02/26/2016 CLINICAL DATA:  Increasing shortness of breath. Frequent car travels. EXAM: CT ANGIOGRAPHY CHEST WITH CONTRAST TECHNIQUE: Multidetector CT imaging of the chest was performed using the standard protocol during bolus administration of intravenous contrast. Multiplanar CT image reconstructions and MIPs were obtained to evaluate the vascular anatomy. CONTRAST:  80 cc Isovue 370 intravenously. COMPARISON:  Chest radiograph 02/25/2016 FINDINGS: Cardiovascular: Satisfactory opacification of the pulmonary arteries to the segmental level. A small pulmonary embolus within a subsegmental branch in the left upper lobe. Normal heart size. No pericardial effusion. Calcific atherosclerotic disease of the coronary arteries. Mediastinum/Nodes: Several mildly enlarged mediastinal lymph nodes. Thyroid gland, trachea and esophagus within normal limits. Lungs/Pleura: There is an irregular soft tissue mass in the left hemithorax at the bifurcation of the left lower and lingular bronchus. The mass causes narrowing of the bronchi and the traversing lower lobe pulmonary artery. This mass is medial and superior to prior postsurgical/ posttreatment site from left  upper lobe wedge resection. The mass measures 5.9 by 4.9 by 6.5 cm. Numerous spiculated pulmonary nodules are seen superior to the index mass, consistent with satellite lesions. Upper lobe predominant emphysema. Upper Abdomen: No acute abnormality.  Cholelithiasis. Musculoskeletal: No chest wall abnormality. No acute or significant osseous findings. Review of the MIP images confirms the above findings. IMPRESSION: Development of spiculated highly suspicious soft tissue mass medial to the postsurgical site from prior left upper lobe wedge resection with numerous satellite lesions in the superior lung parenchyma. Findings are consistent with primary lung cancer recurrence. Borderline mediastinal lymphadenopathy. Calcific atherosclerotic disease of the coronary arteries. Cholelithiasis. Tiny, probably sub clinical pulmonary embolus within a subsegmental branch of the left upper lobar pulmonary artery. These results were called by telephone at the time of interpretation on 02/26/2016 at 3:45 pm to Dr. Rexene Edison, NP , who verbally acknowledged these results. Electronically Signed   By: Fidela Salisbury M.D.   On: 02/26/2016 15:48   Nm Pet Image Restag (ps) Skull Base To Thigh  Result Date: 03/12/2016 CLINICAL DATA:  Subsequent treatment strategy for lung carcinoma. The regional diagnosis 2014. Recurrence with biopsy 318. EXAM: NUCLEAR MEDICINE PET SKULL BASE TO THIGH TECHNIQUE: 10.0 mCi F-18 FDG was injected intravenously. Full-ring PET imaging was performed from the skull base to thigh after the radiotracer. CT data was obtained and used for attenuation correction and anatomic localization. FASTING BLOOD GLUCOSE:  Value: Fifty-four Mg/dl COMPARISON:  CT chest 02/26/2016, PET-CT 09/07/2012 FINDINGS: NECK No hypermetabolic lymph nodes in the neck. CHEST Parahilar masslike thickening along the surgical and brachytherapy therapy site in the LEFT upper lobe is intensely hypermetabolic. Medially lesion measures 4.3  by 2.5 cm with intense metabolic activity (SUV max equal 17.3). Intense metabolic activity extends along the scar to the lateral chest wall pleural surface. Hypermetabolic subcarinal and prevascular lymph nodes are small but with intense metabolic activity for size. For example subcarinal lymph node with SUV max equal 3.5 measures 6 mm short axis. Similar hyper meta prevascular and AP window nodes. No hypermetabolic supraclavicular nodes. There is a single hypermetabolic LEFT axillary lymph node (image 66) of fused data set. Lymph node measuring 7 mm but with intense metabolic activity for size with SUV max equal 4.6. ABDOMEN/PELVIS No abnormal metabolic activity the liver. Multiple gallstones noted. Adrenal glands are normal. No hypermetabolic abdominopelvic lymph nodes. Single focus of intense activity within the sigmoid colon (image 173 fused data set). No lesion identified on CT portion  exam. SKELETON No focal hypermetabolic activity to suggest skeletal metastasis. IMPRESSION: 1. Lung cancer recurrence within the LEFT upper lobe along the surgical improved teeth therapy treatment site. Recurrence extends from the LEFT hilum to the pleural surface. 2. 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 3. High concern for distant nodal metastasis to the LEFT axilla. 4. Single focus uptake in the sigmoid colon without associated CT findings is favored benign. Recommend Attention on follow-up. Electronically Signed   By: Suzy Bouchard M.D.   On: 03/12/2016 11:31   [place] Cm  Result Date: 02/26/2016 CLINICAL DATA:  Two months history of productive cough. Evaluate for sinusitis. EXAM: CT PARANASAL SINUS LIMITED WITHOUT CONTRAST TECHNIQUE: Non-contiguous multidetector CT images of the paranasal sinuses were obtained in a single plane without contrast. COMPARISON:  None. FINDINGS: The globes and extraocular muscles appear symmetrical. No air fluid  levels in the paranasal sinuses. The frontal bones, orbital rims, maxillary antral walls, nasal bones, nasal septum, nasal spine, maxilla, pterygoid plates, zygomatic arches, temporomandibular joints are intact. IMPRESSION: No evidence of acute or chronic sinusitis. Electronically Signed   By: Fidela Salisbury M.D.   On: 02/26/2016 15:50    ASSESSMENT: This is a very pleasant 76 years old white male with recurrent non-small cell lung cancer, squamous cell carcinoma, likely stage IIIa (T2b, N2, MX) presented with recurrent disease in the left upper lobe as well as mediastinal lymphadenopathy diagnosed in March 2018. This could be also a stage IV of the left axillary lymph node is positive for malignancy.   PLAN: I had a lengthy discussion with the patient and his wife today about his current disease stage, prognosis and treatment options. I recommended for the patient to complete the staging workup by ordering a MRI of the brain to rule out brain metastasis. I will also arrange for the patient to have ultrasound-guided core biopsy of the left axillary hypermetabolic lymph node to rule out metastatic disease to this area. If the left axillary lymph node is negative for malignancy, we will treat the patient for a stage IIIa lung cancer with concurrent chemoradiation. I recommended for the patient a course of concurrent chemoradiation with weekly carboplatin for AUC of 2 and paclitaxel 45 MG/M2. I discussed with the patient adverse effect of the chemotherapy including but not limited to alopecia, myelosuppression, nausea and vomiting, peripheral neuropathy, liver or renal dysfunction. I will arrange for the patient to have a chemotherapy education class before starting the first dose of his chemotherapy. He is expected to start the first dose of this treatment on 03/29/2016. The patient will be seen later today by Dr. Tammi Klippel for evaluation and discussion of the radiotherapy option. I will call his  pharmacy with prescription for Compazine 10 mg by mouth every 6 hours as needed for nausea and also gave the patient prescription for Hycodan for cough. The patient was seen during the multidisciplinary thoracic oncology clinic today by medical oncology, radiation oncology, thoracic navigator, physical therapist and social worker. For the history of pulmonary embolism, the patient will continue his current treatment with Lovenox. I will see the patient back for follow-up visit in 3 weeks for evaluation and management of any adverse effect of his treatment. He was advised to call immediately if he has any concerning symptoms in the interval.  The patient voices understanding of current disease status and treatment options and is in agreement with the current care plan.  All questions were answered. The patient  knows to call the clinic with any problems, questions or concerns. We can certainly see the patient much sooner if necessary.  Thank you so much for allowing me to participate in the care of Travis [name]. I will continue to follow up the patient with you and assist in his care.  I spent 55 minutes counseling the patient face to face. The total time spent in the appointment was 80 minutes.  Disclaimer: This note was dictated with voice recognition software. Similar sounding words can inadvertently be transcribed and may not be corrected upon review.   Armonii Sieh K. March 18, 2016, 2:18 PM

## 2016-03-18 NOTE — Progress Notes (Unsigned)
[place] Clinical Social Work  Clinical Social Work met with patient/family  at [employer] appointment to offer support and assess for psychosocial needs.  Mr. Gratz shared he has little to no distress, he reported his spouse completed his form.  The patient reported he still needs biopsy to determine treatment plan.      ONCBCN DISTRESS SCREENING 03/18/2016  Screening Type Initial Screening  Distress experienced in past week (1-10) 10  Emotional problem type Nervousness/Anxiety;Adjusting to illness;Boredom  Physical Problem type Breathing  Referral to clinical social work Yes     Clinical Social Work briefly discussed Clinical Social Work role and [employer] support programs/services.  Clinical Social Work encouraged patient to call with any additional questions or concerns.   Polo Riley, MSW, LCSW, OSW-C Clinical Social Worker [facility] [phone]

## 2016-03-18 NOTE — Progress Notes (Signed)
Radiation Oncology         [phone] ________________________________  Multidisciplinary Thoracic Oncology Clinic [facility]) Initial outpatient Consultation  Name: Travis [name] MRN: [id number]  Date: 03/18/2016  DOB: 10-Nov-1940  ER:[id number],QPYP, PA-C  Tanda Rockers, MD   REFERRING PHYSICIAN: Tanda Rockers, MD  DIAGNOSIS: The encounter diagnosis was Malignant neoplasm of upper lobe of left lung ([place]).    ICD-9-CM ICD-10-CM   1. Malignant neoplasm of upper lobe of left lung (HCC) 162.3 C34.12 Ambulatory referral to Radiation Oncology    HISTORY OF PRESENT ILLNESS: Travis [name] is a 76 y.o. male with LUL sub-lobar resection and brachytherapy in 2008 for squamous cell carcinoma, now with recurrence at/near resection site plus left axillary node positive on PET.   The patient was seen by his pulmonologist Dr. Melvyn Novas on 02/25/2016 and a chest X-ray was performed at that time showing increasing density in the left mid lung in the region of previous surgery and radiation. Accordingly the patient had a CT Angio Chest on 02/26/2016 which showed development of a 5.9 x 4.9 x 6.5 cm spiculated, highly suspicious soft tissue mass medial to the postsurgical site from prior left upper lobe wedge resection with numerous satellite lesions in the superior lung parenchyma.  Endobronchial biopsy of the LUL on 03/11/2016 revealed squamous cell carcinoma. Biopsy of the bronchial lavage showed atypical cells. Specimen was insufficient for molecular studies.  PET scan on 03/12/2016 showed lung cancer recurrence within the left upper lobe with an SUV max of 17.3, recurrence extends from the left hilum to the pleural surface. There are small intensely hypermetabolic mediastinal lymph nodes consistent with mediastinal nodal metastasis. There is high concern for distant nodal metastasis to the left axilla with a 7 mm node showing SUV max of 4.6. No extrathoracic disease noted.  His case was presented this morning  in multidisciplinary thoracic oncology conference and the patient presents today to discuss treatment options and recommendations regarding his disease.  PREVIOUS RADIATION THERAPY: Yes radioactive seed embedded mesh placed in LUL postoperatively in 2008  PAST MEDICAL HISTORY:  Past Medical History:  Diagnosis Date  . AAA family hx 11/20/2014  . CAD (coronary artery disease)    s/p RCA atherectomy 1994, cath 1999 with occluded RCA with left to right collaterals, PCI of LAD 2007, s/p PCI of LAD for instent thrombosis 2008, PCI 08/2015 with DES to LAD due to ISR with known L-R collaterals to RCA  . Dyslipidemia   . Encounter for antineoplastic chemotherapy 03/18/2016  . Goals of care, counseling/discussion 03/18/2016  . Hyperlipidemia   . Hypertension   . Insomnia   . Lung cancer [facility])    s/p lobectomy, chemo and radiation therapy  . OSA (obstructive sleep apnea)    intolerant to CPAP  . PAF (paroxysmal atrial fibrillation) ([place])   . Psoriasis   . Squamous cell carcinoma of lung, stage III, left ([place]) 03/18/2016  . Tobacco abuse       PAST SURGICAL HISTORY: Past Surgical History:  Procedure Laterality Date  . APPENDECTOMY    . CARDIAC CATHETERIZATION  1999  . CARDIAC CATHETERIZATION N/A 08/22/2015   Procedure: Left Heart Cath and Coronary Angiography;  Surgeon: Leonie Man, MD;  Location: [facility];  Service: Cardiovascular;  Laterality: N/A;  . CARDIAC CATHETERIZATION N/A 08/22/2015   Procedure: Coronary Stent Intervention;  Surgeon: Leonie Man, MD;  Location: [place] [facility];  Service: Cardiovascular;  Laterality: N/A;  . EXPLORATORY LAPAROTOMY  secondary to trauma  . multiple PCT's to RCA and LAD  2007/2008  . s/p lung lobectomy  2007  . VIDEO BRONCHOSCOPY N/A 08/28/2012   Procedure: VIDEO BRONCHOSCOPY WITHOUT FLUORO;  Surgeon: Elsie Stain, MD;  Location: [facility];  Service: Cardiopulmonary;  Laterality: N/A;  . VIDEO BRONCHOSCOPY Bilateral 03/11/2016    Procedure: VIDEO BRONCHOSCOPY WITHOUT FLUORO;  Surgeon: Tanda Rockers, MD;  Location: [facility];  Service: Cardiopulmonary;  Laterality: Bilateral;    FAMILY HISTORY:  Family History  Problem Relation Age of Onset  . Heart attack Father   . Heart disease Mother   . Heart attack Mother   . Coronary artery disease Brother   . Lymphoma Brother   . Breast cancer Sister   . Diabetes Sister   . Heart disease Sister   . Colon cancer Neg Hx   . Stomach cancer Neg Hx     SOCIAL HISTORY:  Social History   Social History  . Marital status: Married    Spouse name: N/A  . Number of children: 3  . Years of education: N/A   Occupational History  . [employer]   Social History Main Topics  . Smoking status: Former Smoker    Packs/day: 0.75    Years: 58.00    Types: Cigarettes    Quit date: 02/27/2016  . Smokeless tobacco: Former Systems developer    Types: Chew    Quit date: 07/04/2012  . Alcohol use No  . Drug use: No  . Sexual activity: Not on file   Other Topics Concern  . Not on file   Social History Narrative  . No narrative on file    ALLERGIES: Patient has no known allergies.  MEDICATIONS:  Current Outpatient Prescriptions  Medication Sig Dispense Refill  . acetaminophen-codeine (TYLENOL #3) 300-30 MG tablet One every 4 hours as needed for cough (Patient taking differently: Take 1 tablet by mouth every 4 (four) hours as needed (cough). ) 40 tablet 0  . albuterol (PROAIR HFA) 108 (90 Base) MCG/ACT inhaler Inhale 2 puffs into the lungs every 4 (four) hours as needed for wheezing or shortness of breath.     . clopidogrel (PLAVIX) 75 MG tablet Take 1 tablet (75 mg total) by mouth daily with breakfast. 90 tablet 3  . dextromethorphan-guaiFENesin (MUCINEX DM) 30-600 MG 12hr tablet Take 2 tablets by mouth 2 (two) times daily as needed for cough.     . enoxaparin (LOVENOX) 150 MG/ML injection Inject 0.61 mLs (90 mg total) into the skin every 12 (twelve) hours. (Patient  taking differently: Inject 90 mg into the skin. Daily at 9pm) 14 Syringe 1  . famotidine (PEPCID) 20 MG tablet One at bedtime    . ferrous sulfate 325 (65 FE) MG tablet Take 325 mg by mouth at bedtime.     Marland Kitchen HYDROcodone-homatropine (HYCODAN) 5-1.5 MG/5ML syrup Take 5 mLs by mouth every 6 (six) hours as needed for cough. 120 mL 0  . isosorbide mononitrate (IMDUR) 30 MG 24 hr tablet Take 1 tablet (30 mg total) by mouth daily. (Patient taking differently: Take 30 mg by mouth at bedtime. ) 90 tablet 3  . MULTAQ 400 MG tablet TAKE 1 TABLET BY MOUTH TWICE A DAY 30 (Patient taking differently: TAKE 1 TABLET BY MOUTH TWICE A DAY) 60 tablet 10  . nitroGLYCERIN (NITROSTAT) 0.4 MG SL tablet Place 0.4 mg under the tongue every 5 (five) minutes as needed for chest pain. X 3 doses    .  ondansetron (ZOFRAN) 8 MG tablet Take 1 tablet (8 mg total) by mouth every 8 (eight) hours as needed for nausea or vomiting. 20 tablet 2  . pantoprazole (PROTONIX) 40 MG tablet Take 1 tablet (40 mg total) by mouth daily. 90 tablet 3  . predniSONE (DELTASONE) 10 MG tablet Take  2 each am until better then  1 each am (Patient taking differently: Take '20mg'$  in the morning) 100 tablet 0  . promethazine (PHENERGAN) 25 MG tablet Take 1 tablet (25 mg total) by mouth every 6 (six) hours as needed for nausea or vomiting. 30 tablet 0  . rosuvastatin (CRESTOR) 10 MG tablet Take 1 tablet (10 mg total) by mouth daily. (Patient taking differently: Take 10 mg by mouth at bedtime. ) 90 tablet 3  . Tiotropium Bromide-Olodaterol (STIOLTO RESPIMAT) 2.5-2.5 MCG/ACT AERS Inhale 2 puffs into the lungs daily. 1 Inhaler 0  . traZODone (DESYREL) 100 MG tablet Take 200 mg by mouth at bedtime.      No current facility-administered medications for this encounter.     REVIEW OF SYSTEMS:  On review of systems, the patient reports that he is doing well overall. He denies any chest pain, fevers, chills, night sweats, unintended weight changes. He reports cough  but, denies hemoptysis. He also reports wheezing. He denies any bladder disturbances, and denies abdominal pain, nausea or vomiting. He reports constipation secondary to taking Tylenol #3. He denies any new musculoskeletal or joint aches or pains. He believes he felt a swollen lymph node in his left axilla the other day but denies pain. He denies swelling of his lower extremities. A complete review of systems is obtained and is otherwise negative.    PHYSICAL EXAM:  Wt Readings from Last 3 Encounters:  03/18/16 202 lb 4.8 oz (91.8 kg)  03/16/16 204 lb (92.5 kg)  03/04/16 203 lb (92.1 kg)   Temp Readings from Last 3 Encounters:  03/18/16 98.3 F (36.8 C) (Oral)  03/11/16 98.7 F (37.1 C) (Oral)  02/26/16 98.9 F (37.2 C) (Oral)   BP Readings from Last 3 Encounters:  03/18/16 123/69  03/16/16 118/70  03/11/16 (!) 129/47   Pulse Readings from Last 3 Encounters:  03/18/16 69  03/16/16 60  03/04/16 69    0/10  In general this is a well appearing caucasian male in no acute distress. He is alert and oriented x4 and appropriate throughout the examination. HEENT reveals that the patient is normocephalic, atraumatic. EOMs are intact. PERRLA. Skin is intact without any evidence of gross lesions. Cardiovascular exam reveals a regular rate and rhythm, no clicks rubs or murmurs are auscultated. Chest is positive for inspiratory and expiratory wheezing throughout. Lymphatic assessment is performed and reveals no palpable adenopathy in the cervical, supraclavicular or axillary chains. Abdomen has active bowel sounds in all quadrants and is intact. The abdomen is soft, non tender, non distended. Lower extremities are negative for pretibial pitting edema, deep calf tenderness, cyanosis or clubbing.   KPS = 80  100 - Normal; no complaints; no evidence of disease. 90   - Able to carry on normal activity; minor signs or symptoms of disease. 80   - Normal activity with effort; some signs or symptoms of  disease. 58   - Cares for self; unable to carry on normal activity or to do active work. 60   - Requires occasional assistance, but is able to care for most of his personal needs. 50   - Requires considerable assistance and frequent medical care.  37   - Disabled; requires special care and assistance. 40   - Severely disabled; hospital admission is indicated although death not imminent. 83   - Very sick; hospital admission necessary; active supportive treatment necessary. 10   - Moribund; fatal processes progressing rapidly. 0     - Dead  Karnofsky DA, Abelmann [place], Craver LS and [place] JH [phone]) The use of the nitrogen mustards in the palliative treatment of carcinoma: with particular reference to bronchogenic carcinoma Cancer 1 634-56  LABORATORY DATA:  Lab Results  Component Value Date   WBC 16.3 (H) 03/18/2016   HGB 14.1 03/18/2016   HCT 44.6 03/18/2016   MCV 81.1 03/18/2016   PLT 189 03/18/2016   Lab Results  Component Value Date   NA 139 03/18/2016   K 4.7 03/18/2016   CL 105 02/26/2016   CO2 27 03/18/2016   Lab Results  Component Value Date   ALT 15 03/18/2016   AST 10 03/18/2016   ALKPHOS 84 03/18/2016   BILITOT 0.94 03/18/2016     RADIOGRAPHY: Nm Pet Image Restag (ps) Skull Base To Thigh  Result Date: 03/12/2016 CLINICAL DATA:  Subsequent treatment strategy for lung carcinoma. The regional diagnosis 2014. Recurrence with biopsy 318. EXAM: NUCLEAR MEDICINE PET SKULL BASE TO THIGH TECHNIQUE: 10.0 mCi F-18 FDG was injected intravenously. Full-ring PET imaging was performed from the skull base to thigh after the radiotracer. CT data was obtained and used for attenuation correction and anatomic localization. FASTING BLOOD GLUCOSE:  Value: Fifty-four Mg/dl COMPARISON:  CT chest 02/26/2016, PET-CT 09/07/2012 FINDINGS: NECK No hypermetabolic lymph nodes in the neck. CHEST Parahilar masslike thickening along the surgical and brachytherapy therapy site in the LEFT upper lobe is  intensely hypermetabolic. Medially lesion measures 4.3 by 2.5 cm with intense metabolic activity (SUV max equal 17.3). Intense metabolic activity extends along the scar to the lateral chest wall pleural surface. Hypermetabolic subcarinal and prevascular lymph nodes are small but with intense metabolic activity for size. For example subcarinal lymph node with SUV max equal 3.5 measures 6 mm short axis. Similar hyper meta prevascular and AP window nodes. No hypermetabolic supraclavicular nodes. There is a single hypermetabolic LEFT axillary lymph node (image 66) of fused data set. Lymph node measuring 7 mm but with intense metabolic activity for size with SUV max equal 4.6. ABDOMEN/PELVIS No abnormal metabolic activity the liver. Multiple gallstones noted. Adrenal glands are normal. No hypermetabolic abdominopelvic lymph nodes. Single focus of intense activity within the sigmoid colon (image 173 fused data set). No lesion identified on CT portion exam. SKELETON No focal hypermetabolic activity to suggest skeletal metastasis. IMPRESSION: 1. Lung cancer recurrence within the LEFT upper lobe along the surgical improved teeth therapy treatment site. Recurrence extends from the LEFT hilum to the pleural surface. 2. 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 3. High concern for distant nodal metastasis to the LEFT axilla. 4. Single focus uptake in the sigmoid colon without associated CT findings is favored benign. Recommend Attention on follow-up. Electronically Signed   By: Suzy Bouchard M.D.   On: 03/12/2016 11:31      IMPRESSION/PLAN: 1. 76 y.o. gentleman with recurrent NSCLC of the LUL with associated mediastinal lymphadenopathy and suspicious left axillary node.  The patient was referred today for presentation in the multidisciplinary conference.  Radiology studies and pathology slides were presented there for review and discussion of treatment options.   A consensus was discussed regarding potential next steps including the need for  axillary biopsy and brain MRI.  Pending those results, we could consider moving forward with chemoRT if locally advanced disease.   We talked to the patient and family about the findings and work-up thus far.  We discussed the natural history of recurrent non-small cell lung cancer and general treatment, highlighting the role of concurrent radiotherapy in the management.  We discussed the available radiation techniques, and focused on the details of logistics and delivery.  The patient has been treated with radiation to a nearby area in the past and we will take that into consideration when creating his treatment plan. We reviewed the anticipated acute and late sequelae associated with radiation in this setting.  The patient was encouraged to ask questions that we answered to the best of our ability. The patient would like to proceed with chemoradiation if recommended based on biopsy results.    Nicholos Johns, PA-C    Tyler Pita, MD  [facility] Direct Dial: [phone]  Fax: [phone] [email or web address]  Skype  [email or web address]  This document serves as a record of services personally performed by Tyler Pita, MD and Freeman Caldron, PA-C. It was created on their behalf by Arlyce Harman, a trained medical scribe. The creation of this record is based on the scribe's personal observations and the provider's statements to them. This document has been checked and approved by the attending provider.

## 2016-03-18 NOTE — Telephone Encounter (Signed)
lmtcb x2 for pt. 

## 2016-03-19 ENCOUNTER — Encounter: Payer: Self-pay | Admitting: *Deleted

## 2016-03-19 NOTE — Telephone Encounter (Signed)
Called and spoke with pts wife and she stated that they spoke with Dr. Earlie Server and since the pt has several other tests that need to be done, Dr. Earlie Server would like for him to stay on the lovenox until these tests have been done.  MW are you ok with this as well?  Please advise. Thanks  No Known Allergies

## 2016-03-19 NOTE — Progress Notes (Signed)
Oncology Nurse Navigator Documentation  Oncology Nurse Navigator Flowsheets 03/19/2016  Navigator Location [facility]  Navigator Encounter Type Clinic/MDC/I spoke with patient and wife yesterday at [facility].  I gave and explained information on lung cancer and support and resources here at [facility].   I noticed today that patient's axillary biopsy is scheduled for 2 days after his 1st chemo.  I updated Dr. Julien Nordmann to see if this needs to be changed.   Abnormal Finding Date 02/26/2016  Confirmed Diagnosis Date 03/11/2016  Multidisiplinary Clinic Date 03/18/2016  Patient Visit Type MedOnc  Treatment Phase Pre-Tx/Tx Discussion  Barriers/Navigation Needs Coordination of Care;Education  Education Newly Diagnosed Cancer Education;Other  Interventions Education;Coordination of Care  Coordination of Care Other  Education Method Verbal;Written  Support Groups/Services Other  Acuity Level 2  Time Spent with Patient 61

## 2016-03-20 ENCOUNTER — Encounter: Payer: Self-pay | Admitting: Internal Medicine

## 2016-03-22 ENCOUNTER — Telehealth: Payer: Self-pay | Admitting: *Deleted

## 2016-03-22 NOTE — Telephone Encounter (Signed)
Oncology Nurse Navigator Documentation  Oncology Nurse Navigator Flowsheets 03/22/2016  Navigator Location [facility]  Navigator Encounter Type Telephone/I updated Dr. Julien Nordmann regarding Travis [name] appt's  He asked to cancel him chemo appt until after his biopsy on 3/28.  I cancelled and called patient.  Travis [name] answered and I updated on plan.  I notified scheduling to call patient with an appt for chemo education class on 04/05/16.     Telephone Outgoing Call  Treatment Phase Pre-Tx/Tx Discussion  Barriers/Navigation Needs Coordination of Care  Interventions Coordination of Care  Coordination of Care Appts  Acuity Level 2  Time Spent with Patient 30

## 2016-03-22 NOTE — Telephone Encounter (Signed)
Consider zofran 8 mg po tid prn. D/c compazine.

## 2016-03-22 NOTE — Telephone Encounter (Signed)
Yes fine with me but since this is now a rec (continue rx) by Dr Earlie Server his office needs to be ordering the dosage and  with refills as this is a blood, not a lung, issue

## 2016-03-22 NOTE — Telephone Encounter (Signed)
Spoke with pt's wife, Sonia Baller. She is aware of MW's recommendation. Nothing further was needed.

## 2016-03-22 NOTE — Telephone Encounter (Signed)
MW please advise.  Thanks.  

## 2016-03-22 NOTE — Telephone Encounter (Signed)
"  This is Travis [name] with [facility] in [place] [phone]).  Dr. Julien Nordmann ordered Compazine in the 15 th.  There is an interaction with the Multaq 400 mg ordered by another doctor.  Compazine increases the QT interval.  Calling to make Dr. Julien Nordmann aware.

## 2016-03-23 MED ORDER — ONDANSETRON HCL 8 MG PO TABS: 8.0000 mg | ORAL_TABLET | Freq: Three times a day (TID) | ORAL | 2 refills | Status: AC | PRN

## 2016-03-23 NOTE — Telephone Encounter (Signed)
[facility] Pharmacist at [facility] [place]) notified to D/C compazine and Zofran ordered instead.   "That's probably going to prolong more that the compazine.  Going with Promethazine is a good option."  Will notify provider

## 2016-03-24 ENCOUNTER — Other Ambulatory Visit: Payer: Medicare Other

## 2016-03-24 ENCOUNTER — Encounter: Payer: Self-pay | Admitting: *Deleted

## 2016-03-24 NOTE — Progress Notes (Signed)
Oncology Nurse Navigator Documentation  Oncology Nurse Navigator Flowsheets 03/24/2016  Navigator Location [facility]  Navigator Encounter Type Other/I checked on Travis [name] authorization for MRI Brain. Waiting to hear back.  Treatment Phase Pre-Tx/Tx Discussion  Barriers/Navigation Needs Coordination of Care  Interventions Coordination of Care  Coordination of Care Other  Acuity Level 1  Time Spent with Patient 15

## 2016-03-25 ENCOUNTER — Telehealth: Payer: Self-pay | Admitting: Internal Medicine

## 2016-03-25 ENCOUNTER — Telehealth: Payer: Self-pay | Admitting: Medical Oncology

## 2016-03-25 ENCOUNTER — Telehealth: Payer: Self-pay | Admitting: *Deleted

## 2016-03-25 DIAGNOSIS — C3490 Malignant neoplasm of unspecified part of unspecified bronchus or lung: Secondary | ICD-10-CM

## 2016-03-25 MED ORDER — HYDROCODONE-HOMATROPINE 5-1.5 MG/5ML PO SYRP: 5.0000 mL | ORAL_SOLUTION | Freq: Four times a day (QID) | ORAL | 0 refills | Status: DC | PRN

## 2016-03-25 NOTE — Telephone Encounter (Addendum)
Per Julien Nordmann I instructed pt to contact Dr Melvyn Novas for symptom management. Pt stated he will contact Dr. Melvyn Novas today.

## 2016-03-25 NOTE — Telephone Encounter (Signed)
Oncology Nurse Navigator Documentation  Oncology Nurse Navigator Flowsheets 03/25/2016  Navigator Location [facility]  Navigator Encounter Type Telephone/I received a call from [employer].  They would like to see the patient after his MRI Brain.  I stated I was waiting for aurthorization from insurance.  Scan is now approved and I called central scheduling to schedule.  I called Mr. Barren but was unable to reach.  I asked that he call me and left my name and phone number to call. I will call [facility] to update of MRI brain scheduled appt.   Telephone Outgoing Call  Treatment Phase Pre-Tx/Tx Discussion  Barriers/Navigation Needs Coordination of Care  Interventions Coordination of Care  Coordination of Care Appts;Radiology  Acuity Level 2  Time Spent with Patient 30

## 2016-03-25 NOTE — Telephone Encounter (Signed)
Attempted to contact pt but no answer unable to leave vm

## 2016-03-25 NOTE — Telephone Encounter (Signed)
Wife calling and left message for refill for hycodan and needing something for chest tightness.

## 2016-03-25 NOTE — Telephone Encounter (Signed)
sw pt wife to confirm added chemo ed class to 4/2 appts per LOS

## 2016-03-25 NOTE — Telephone Encounter (Signed)
Wife reports " chest tightness, increasing SOB , gurgles at times. I think he is anxious too about what is coming next". His Cpap machine helps but when he gets up the tightness resumes. I asked her to have pt call me. I spoke to pt who sounds very SOB, is wheezing and has a hard time finishing sentences as we are talking.

## 2016-03-26 ENCOUNTER — Telehealth: Payer: Self-pay | Admitting: *Deleted

## 2016-03-26 NOTE — Telephone Encounter (Signed)
Attempted to contact the pt again and the VM has not been set up.  Will try back later.

## 2016-03-26 NOTE — Telephone Encounter (Signed)
Oncology Nurse Navigator Documentation  Oncology Nurse Navigator Flowsheets 03/26/2016  Navigator Location [facility]  Navigator Encounter Type Telephone/I called and followed up with Mr. Glascoe regarding his MRI Aaron [facility]elman scan appt. I spoke with his wife.  She verbalized understanding of appt time and place.  I will place a map of [facility] and location of MRI in the mail today.   Telephone Outgoing Call  Treatment Phase Pre-Tx/Tx Discussion  Barriers/Navigation Needs Education  Education Other  Interventions Education  Education Method Verbal;Written  Acuity Level 2  Acuity Level 2 Educational needs  Time Spent with Patient 30

## 2016-03-29 ENCOUNTER — Other Ambulatory Visit: Payer: Medicare Other

## 2016-03-29 ENCOUNTER — Encounter: Payer: Self-pay | Admitting: *Deleted

## 2016-03-29 ENCOUNTER — Other Ambulatory Visit: Payer: Self-pay | Admitting: Medical Oncology

## 2016-03-29 ENCOUNTER — Ambulatory Visit: Payer: Medicare Other

## 2016-03-29 DIAGNOSIS — Z5111 Encounter for antineoplastic chemotherapy: Secondary | ICD-10-CM

## 2016-03-29 MED ORDER — PROMETHAZINE HCL 25 MG PO TABS: 25.0000 mg | ORAL_TABLET | Freq: Four times a day (QID) | ORAL | 0 refills | Status: DC | PRN

## 2016-03-29 NOTE — Telephone Encounter (Signed)
Called and spoke with pt and he stated that his chest stays clogged up and he cannot get anything out.  He stated that his breathing is worse when he first gets up in the morning, but then once he moves around a little, he is ok.  He sounded out of breath just talking on the phone, but he stated that this is his normal.  He denies any fever or body aches.  He stated that he is having MRI and US biopsy this week and then will follow up with Dr. Earlie Server.  He wanted to see what MW is recs.  Please advise. Thanks  No Known Allergies

## 2016-03-29 NOTE — Telephone Encounter (Signed)
No additional recs > he will likely continue to have these symptoms until he gets treated as the airways on the Left are quite swollen as I reviewed with him  If any worsening of symptoms on the instructions we already gave him then needs to return with all meds in hand using a trust but verify approach to confirm accurate Medication  Reconciliation

## 2016-03-29 NOTE — Progress Notes (Signed)
prpromethazine

## 2016-03-29 NOTE — Telephone Encounter (Signed)
lmomtcb x1 

## 2016-03-29 NOTE — Progress Notes (Signed)
Oncology Nurse Navigator Documentation  Oncology Nurse Navigator Flowsheets 03/29/2016  [facility] Location [facility]  Navigator Encounter Type Other/I called [facility] Rad [facility] to update them on his MRI brain is scheduled for 03/30/16.  I spoke with [place] and she is aware.   Treatment Phase Pre-Tx/Tx Discussion  Barriers/Navigation Needs Coordination of Care  Interventions Coordination of Care  Coordination of Care Other  Acuity Level 2  Time Spent with Patient 30

## 2016-03-30 ENCOUNTER — Telehealth: Payer: Self-pay

## 2016-03-30 ENCOUNTER — Ambulatory Visit (HOSPITAL_COMMUNITY)
Admission: RE | Admit: 2016-03-30 | Discharge: 2016-03-30 | Disposition: A | Discharge status: Home / Self Care | Payer: Medicare Other | Source: Ambulatory Visit | Attending: Internal Medicine | Admitting: Internal Medicine

## 2016-03-30 DIAGNOSIS — Z7189 Other specified counseling: Secondary | ICD-10-CM | POA: Insufficient documentation

## 2016-03-30 DIAGNOSIS — R9082 White matter disease, unspecified: Secondary | ICD-10-CM | POA: Diagnosis not present

## 2016-03-30 DIAGNOSIS — G319 Degenerative disease of nervous system, unspecified: Secondary | ICD-10-CM | POA: Diagnosis not present

## 2016-03-30 DIAGNOSIS — C3492 Malignant neoplasm of unspecified part of left bronchus or lung: Secondary | ICD-10-CM | POA: Insufficient documentation

## 2016-03-30 DIAGNOSIS — Z5111 Encounter for antineoplastic chemotherapy: Secondary | ICD-10-CM

## 2016-03-30 MED ORDER — GADOBENATE DIMEGLUMINE 529 MG/ML IV SOLN
20.0000 mL | Freq: Once | INTRAVENOUS | Status: AC | PRN
  Administered 2016-03-30: 19 mL via INTRAVENOUS

## 2016-03-30 MED ORDER — DIPHENHYDRAMINE HCL 25 MG PO CAPS: 25.0000 mg | ORAL_CAPSULE | ORAL | Status: DC

## 2016-03-30 MED ORDER — DIPHENHYDRAMINE HCL 25 MG PO CAPS
ORAL_CAPSULE | ORAL | Status: AC
  Filled 2016-03-30: qty 1

## 2016-03-30 NOTE — Progress Notes (Signed)
While addressing the patient for his gadolinium reaction, it was determined that he was scheduled for a lymph node bx tomorrow.  He has been told to stop his xarelto, but was not informed to hold his plavix for 5 days.  His procedure has been cancelled for tomorrow as he is still taking his plavix.  He just had PCI therapy with stent placement in August 2017.  I have contacted Dr. Fransico Him, his cardiologist, to determine if he is able to hold his plavix.  She has given permission to do so.  Therefore, his procedure has been rescheduled for next Wednesday 4/4.  I have also called Dr. Julien Nordmann to make him aware as the patient was supposed to be starting chemotherapy on Monday as well.  The patient and his wife have their new appointment date and information.   Jarred Purtee E 4:29 PM 03/30/2016

## 2016-03-30 NOTE — Progress Notes (Signed)
Dr. Jeannine Kitten and I were called to MRI secondary to a Gadolinium contrast reaction.  He was administered the contrast with about 10 minutes left in his scan.  He completed the scan and when pulled out he complained of feeling like "I have ants all over my body."  He was noted to have hives on his torso, back, and face.  He was having some wheezing as well, but states this is his norm from his COPD.  He denied feeling SOB.  While on the monitor, he was sating between 92-96% on RA.  He was given his albuterol inhaler which significantly helped his wheezing almost immediately.  His other vitals signs were stable with his blood pressure in the 130/70s.  His IV had since been removed and so he was given '25mg'$  of benadryl orally.  An IV was then restarted just in case it was needed for emergent actions.  He was transferred to [facility] holding room for more closer monitoring by myself.  Over the next 45 minutes to 1 hour, his hives dissipated and he felt great and asked to go home.  His repeat vitals signs remained stable throughout his observation time period.    PE: Chest: initial eval, diffuse expiratory wheeze throughout.  After albuterol and final exam, his wheezing was significantly improved.  O2 sats 96%.  No respiratory distress  Skin: initial eval: hives on face, torso, and back.  Final exam: hives have resolved along with surround erythema.  A/P: Gadolinium reaction The patient was given '25mg'$  of benadryl and his reaction stabilized and then resolved completely.  The patient was taken to his car in a wheelchair, given his dose of benadryl, and released into his wife's care.  Cayli Escajeda E 4:17 PM 03/30/2016

## 2016-03-30 NOTE — Telephone Encounter (Signed)
Kelly with IR called to see if patient's Plavix can be held for lymph node biopsy. Per Dr. Radford Pax, patient may hold Plavix as is stent was not placed in the presence of ACS.  Claiborne Billings was grateful for assistance.

## 2016-03-31 ENCOUNTER — Ambulatory Visit (HOSPITAL_COMMUNITY): Payer: Medicare Other

## 2016-03-31 ENCOUNTER — Ambulatory Visit (HOSPITAL_COMMUNITY): Admission: RE | Admit: 2016-03-31 | Payer: Medicare Other | Source: Ambulatory Visit

## 2016-03-31 NOTE — Telephone Encounter (Signed)
lmtcb X2 for pt.  

## 2016-04-01 ENCOUNTER — Telehealth: Payer: Self-pay | Admitting: Medical Oncology

## 2016-04-01 NOTE — Telephone Encounter (Signed)
I told wife to keep chemo as scheduled.

## 2016-04-01 NOTE — Telephone Encounter (Signed)
Called and spoke to pt's wife. Informed her of the recs per MW. Pt's wife verbalized understanding and denied any further questions or concerns at this time.

## 2016-04-02 NOTE — Assessment & Plan Note (Signed)
PFT's   09/16/10   FEV1 2.60 (85 % ) ratio 61  p 14 % improvement from saba with DLCO  74 % corrects to 79  % for alv volume   - spirometry 11/20/2014   FEV1  1.92 (61%) ratio 61  - 10/17/2015   Try  symbicort 160 2bid ? Effective (pt does not recall) - Spirometry 11/25/2015  FEV1 1.55 (50%)  Ratio 53 p nothing prior - 11/25/2015    try stiolto 2 each am  - Spirometry 02/25/2016  FEV1 1.58 (51%)  Ratio 56 on stiolto     - 03/04/2016  After extensive coaching HFA effectiveness =    90% with [facility]  - started daily pred 03/04/2016 >>>  Until / unless airway involvement with tumor is addressed advised that it is unlikely his symptoms will improve and in meantime no change in meds feasible

## 2016-04-05 ENCOUNTER — Ambulatory Visit (HOSPITAL_BASED_OUTPATIENT_CLINIC_OR_DEPARTMENT_OTHER): Payer: Medicare Other | Admitting: Internal Medicine

## 2016-04-05 ENCOUNTER — Ambulatory Visit: Payer: Medicare Other

## 2016-04-05 ENCOUNTER — Telehealth: Payer: Self-pay | Admitting: Internal Medicine

## 2016-04-05 ENCOUNTER — Encounter: Payer: Self-pay | Admitting: Internal Medicine

## 2016-04-05 ENCOUNTER — Other Ambulatory Visit: Payer: Medicare Other

## 2016-04-05 ENCOUNTER — Encounter: Payer: Self-pay | Admitting: *Deleted

## 2016-04-05 ENCOUNTER — Other Ambulatory Visit (HOSPITAL_BASED_OUTPATIENT_CLINIC_OR_DEPARTMENT_OTHER): Payer: Medicare Other

## 2016-04-05 VITALS — BP 117/91 | HR 64 | Temp 98.2°F | Resp 18 | Ht 71.0 in | Wt 201.5 lb

## 2016-04-05 DIAGNOSIS — J449 Chronic obstructive pulmonary disease, unspecified: Secondary | ICD-10-CM

## 2016-04-05 DIAGNOSIS — C3412 Malignant neoplasm of upper lobe, left bronchus or lung: Secondary | ICD-10-CM | POA: Diagnosis not present

## 2016-04-05 DIAGNOSIS — C3492 Malignant neoplasm of unspecified part of left bronchus or lung: Secondary | ICD-10-CM

## 2016-04-05 DIAGNOSIS — Z5111 Encounter for antineoplastic chemotherapy: Secondary | ICD-10-CM

## 2016-04-05 DIAGNOSIS — R590 Localized enlarged lymph nodes: Secondary | ICD-10-CM

## 2016-04-05 LAB — CBC WITH DIFFERENTIAL/PLATELET
BASO%: 0.1 % (ref 0.0–2.0)
Basophils Absolute: 0 10*3/uL (ref 0.0–0.1)
EOS%: 2.4 % (ref 0.0–7.0)
Eosinophils Absolute: 0.2 10*3/uL (ref 0.0–0.5)
HCT: 40.7 % (ref 38.4–49.9)
HEMOGLOBIN: 12.8 g/dL — AB (ref 13.0–17.1)
LYMPH#: 1.8 10*3/uL (ref 0.9–3.3)
LYMPH%: 17.9 % (ref 14.0–49.0)
MCH: 25.4 pg — ABNORMAL LOW (ref 27.2–33.4)
MCHC: 31.4 g/dL — ABNORMAL LOW (ref 32.0–36.0)
MCV: 80.9 fL (ref 79.3–98.0)
MONO#: 0.7 10*3/uL (ref 0.1–0.9)
MONO%: 7.1 % (ref 0.0–14.0)
NEUT%: 72.5 % (ref 39.0–75.0)
NEUTROS ABS: 7.3 10*3/uL — AB (ref 1.5–6.5)
Platelets: 189 10*3/uL (ref 140–400)
RBC: 5.03 10*6/uL (ref 4.20–5.82)
RDW: 16.7 % — AB (ref 11.0–14.6)
WBC: 10 10*3/uL (ref 4.0–10.3)

## 2016-04-05 LAB — COMPREHENSIVE METABOLIC PANEL
ALBUMIN: 3.1 g/dL — AB (ref 3.5–5.0)
ALT: 15 U/L (ref 0–55)
ANION GAP: 9 meq/L (ref 3–11)
AST: 11 U/L (ref 5–34)
Alkaline Phosphatase: 73 U/L (ref 40–150)
BILIRUBIN TOTAL: 0.67 mg/dL (ref 0.20–1.20)
BUN: 13.8 mg/dL (ref 7.0–26.0)
CALCIUM: 9 mg/dL (ref 8.4–10.4)
CHLORIDE: 106 meq/L (ref 98–109)
CO2: 22 mEq/L (ref 22–29)
CREATININE: 1.1 mg/dL (ref 0.7–1.3)
EGFR: 63 mL/min/{1.73_m2} — ABNORMAL LOW (ref >90)
Glucose: 172 mg/dl — ABNORMAL HIGH (ref 70–140)
Potassium: 4.1 mEq/L (ref 3.5–5.1)
Sodium: 138 mEq/L (ref 136–145)
TOTAL PROTEIN: 6.2 g/dL — AB (ref 6.4–8.3)

## 2016-04-05 MED ORDER — METHYLPREDNISOLONE 4 MG PO TBPK: ORAL_TABLET | ORAL | 0 refills | Status: DC

## 2016-04-05 NOTE — Telephone Encounter (Signed)
Gave patient AVS and calender per 04/05/2016 los. First chemo appt for 4/9 already scheduled. Scheduled 4/16 and 4/23 per 04/05/2016 los.

## 2016-04-05 NOTE — Progress Notes (Signed)
[facility] [phone]   [phone]  OFFICE PROGRESS NOTE  Corine Shelter, PA-C [address] [place] [address]  DIAGNOSIS: Stage IIIa (T2b, N2, Mx presented with left upper lobe lung nodule in addition to mediastinal lymphadenopathy diagnosed in March 2018.) This could be also a stage IV of the left axillary lymph node is positive for malignancy.  PRIOR THERAPY: None  CURRENT THERAPY: Concurrent chemoradiation with weekly carboplatin for AUC of 2 and paclitaxel 45 MG/M2. First dose 04/12/2016.  INTERVAL HISTORY: Travis [name] 76 y.o. male returns to the clinic today for follow-up visit accompanied by his wife. The patient continues to complain of shortness of breath as well as cough and wheezing. He is currently on multiple inhalers as well as prednisone with mild improvement of his condition. He was supposed to have ultrasound-guided biopsy of the left axillary lymph node but unfortunately this was delayed because of his anticoagulation and expected to be done on 04/07/2016. He denied having any chest pain or hemoptysis. He has no fever or chills. He has no weight loss or night sweats. He was also expected to start course of concurrent chemoradiation this week but his radiation was scheduled to be done in [facility] which is far away for the patient and he wanted it to be done in [place]. He also had MRI of the brain performed recently and he today for evaluation before starting his treatment.  MEDICAL HISTORY: Past Medical History:  Diagnosis Date  . AAA family hx 11/20/2014  . CAD (coronary artery disease)    s/p RCA atherectomy 1994, cath 1999 with occluded RCA with left to right collaterals, PCI of LAD 2007, s/p PCI of LAD for instent thrombosis 2008, PCI 08/2015 with DES to LAD due to ISR with known L-R collaterals to RCA  . Dyslipidemia   . Encounter for antineoplastic chemotherapy 03/18/2016  . Goals of care,  counseling/discussion 03/18/2016  . Hyperlipidemia   . Hypertension   . Insomnia   . Lung cancer [facility])    s/p lobectomy, chemo and radiation therapy  . OSA (obstructive sleep apnea)    intolerant to CPAP  . PAF (paroxysmal atrial fibrillation) ([place])   . Psoriasis   . Squamous cell carcinoma of lung, stage III, left ([place]) 03/18/2016  . Tobacco abuse     ALLERGIES:  is allergic to gadolinium derivatives.  MEDICATIONS:  Current Outpatient Prescriptions  Medication Sig Dispense Refill  . acetaminophen-codeine (TYLENOL #3) 300-30 MG tablet One every 4 hours as needed for cough (Patient taking differently: Take 1 tablet by mouth every 4 (four) hours as needed (cough). ) 40 tablet 0  . albuterol (PROAIR HFA) 108 (90 Base) MCG/ACT inhaler Inhale 2 puffs into the lungs every 4 (four) hours as needed for wheezing or shortness of breath.     . clopidogrel (PLAVIX) 75 MG tablet Take 1 tablet (75 mg total) by mouth daily with breakfast. 90 tablet 3  . dextromethorphan-guaiFENesin (MUCINEX DM) 30-600 MG 12hr tablet Take 2 tablets by mouth 2 (two) times daily as needed for cough.     . enoxaparin (LOVENOX) 150 MG/ML injection Inject 0.61 mLs (90 mg total) into the skin every 12 (twelve) hours. (Patient taking differently: Inject 90 mg into the skin. Daily at 9pm) 14 Syringe 1  . famotidine (PEPCID) 20 MG tablet One at bedtime    . ferrous sulfate 325 (65 FE) MG tablet Take 325 mg by mouth at  bedtime.     Marland Kitchen HYDROcodone-homatropine (HYCODAN) 5-1.5 MG/5ML syrup Take 5 mLs by mouth every 6 (six) hours as needed for cough. 120 mL 0  . isosorbide mononitrate (IMDUR) 30 MG 24 hr tablet Take 1 tablet (30 mg total) by mouth daily. (Patient taking differently: Take 30 mg by mouth at bedtime. ) 90 tablet 3  . MULTAQ 400 MG tablet TAKE 1 TABLET BY MOUTH TWICE A DAY 30 (Patient taking differently: TAKE 1 TABLET BY MOUTH TWICE A DAY) 60 tablet 10  . nitroGLYCERIN (NITROSTAT) 0.4 MG SL tablet Place 0.4 mg under the  tongue every 5 (five) minutes as needed for chest pain. X 3 doses    . ondansetron (ZOFRAN) 8 MG tablet Take 1 tablet (8 mg total) by mouth every 8 (eight) hours as needed for nausea or vomiting. 20 tablet 2  . pantoprazole (PROTONIX) 40 MG tablet Take 1 tablet (40 mg total) by mouth daily. 90 tablet 3  . predniSONE (DELTASONE) 10 MG tablet Take  2 each am until better then  1 each am (Patient taking differently: Take '20mg'$  in the morning) 100 tablet 0  . promethazine (PHENERGAN) 25 MG tablet Take 1 tablet (25 mg total) by mouth every 6 (six) hours as needed for nausea or vomiting. 30 tablet 0  . rosuvastatin (CRESTOR) 10 MG tablet Take 1 tablet (10 mg total) by mouth daily. (Patient taking differently: Take 10 mg by mouth at bedtime. ) 90 tablet 3  . Tiotropium Bromide-Olodaterol (STIOLTO RESPIMAT) 2.5-2.5 MCG/ACT AERS Inhale 2 puffs into the lungs daily. 1 Inhaler 0  . traZODone (DESYREL) 100 MG tablet Take 200 mg by mouth at bedtime.      No current facility-administered medications for this visit.     SURGICAL HISTORY:  Past Surgical History:  Procedure Laterality Date  . APPENDECTOMY    . CARDIAC CATHETERIZATION  1999  . CARDIAC CATHETERIZATION N/A 08/22/2015   Procedure: Left Heart Cath and Coronary Angiography;  Surgeon: Leonie Man, MD;  Location: [facility];  Service: Cardiovascular;  Laterality: N/A;  . CARDIAC CATHETERIZATION N/A 08/22/2015   Procedure: Coronary Stent Intervention;  Surgeon: Leonie Man, MD;  Location: [facility];  Service: Cardiovascular;  Laterality: N/A;  . EXPLORATORY LAPAROTOMY     secondary to trauma  . multiple PCT's to RCA and LAD  2007/2008  . s/p lung lobectomy  2007  . VIDEO BRONCHOSCOPY N/A 08/28/2012   Procedure: VIDEO BRONCHOSCOPY WITHOUT FLUORO;  Surgeon: Elsie Stain, MD;  Location: [facility];  Service: Cardiopulmonary;  Laterality: N/A;  . VIDEO BRONCHOSCOPY Bilateral 03/11/2016   Procedure: VIDEO BRONCHOSCOPY WITHOUT  FLUORO;  Surgeon: Tanda Rockers, MD;  Location: [facility];  Service: Cardiopulmonary;  Laterality: Bilateral;    REVIEW OF SYSTEMS:  Constitutional: positive for fatigue Eyes: negative Ears, nose, mouth, throat, and face: negative Respiratory: positive for cough, dyspnea on exertion and wheezing Cardiovascular: negative Gastrointestinal: negative Genitourinary:negative Integument/breast: negative Hematologic/lymphatic: negative Musculoskeletal:negative Neurological: negative Behavioral/Psych: negative Endocrine: negative Allergic/Immunologic: negative   PHYSICAL EXAMINATION: General appearance: alert, cooperative, fatigued and no distress Head: Normocephalic, without obvious abnormality, atraumatic Neck: no adenopathy, no JVD, supple, symmetrical, trachea midline and thyroid not enlarged, symmetric, no tenderness/mass/nodules Lymph nodes: Cervical, supraclavicular, and axillary nodes normal. Resp: wheezes bilaterally Back: symmetric, no curvature. ROM normal. No CVA tenderness. Cardio: regular rate and rhythm, S1, S2 normal, no murmur, click, rub or gallop GI: soft, non-tender; bowel sounds normal; no masses,  no organomegaly Extremities:  extremities normal, atraumatic, no cyanosis or edema Neurologic: Alert and oriented X 3, normal strength and tone. Normal symmetric reflexes. Normal coordination and gait  ECOG PERFORMANCE STATUS: 1 - Symptomatic but completely ambulatory  Blood pressure (!) 117/91, pulse 64, temperature 98.2 F (36.8 C), temperature source Oral, resp. rate 18, height '5\' 11"'$  (1.803 m), weight 201 lb 8 oz (91.4 kg), SpO2 97 %.  LABORATORY DATA: Lab Results  Component Value Date   WBC 10.0 04/05/2016   HGB 12.8 (L) 04/05/2016   HCT 40.7 04/05/2016   MCV 80.9 04/05/2016   PLT 189 04/05/2016      Chemistry      Component Value Date/Time   NA 139 03/18/2016 1348   K 4.7 03/18/2016 1348   CL 105 02/26/2016 1650   CO2 27 03/18/2016 1348   BUN 16.6  03/18/2016 1348   CREATININE 1.2 03/18/2016 1348      Component Value Date/Time   CALCIUM 9.9 03/18/2016 1348   ALKPHOS 84 03/18/2016 1348   AST 10 03/18/2016 1348   ALT 15 03/18/2016 1348   BILITOT 0.94 03/18/2016 1348       RADIOGRAPHIC STUDIES: Mr Jeri Cos NF Contrast  Result Date: 03/30/2016 CLINICAL DATA:  Lung cancer.  Uncontrollable cough. EXAM: MRI HEAD WITHOUT AND WITH CONTRAST TECHNIQUE: Multiplanar, multiecho pulse sequences of the brain and surrounding structures were obtained without and with intravenous contrast. CONTRAST:  50m MULTIHANCE GADOBENATE DIMEGLUMINE 529 MG/ML IV SOLN COMPARISON:  CT head without contrast 07/16/2007. MRI brain without and with contrast 10/17/2005. FINDINGS: Brain: Moderate atrophy and white matter disease has progressed since 2007. The ventricles are proportionate to the degree of atrophy. White matter changes extend into the brainstem. No acute infarct, hemorrhage, or mass lesion is present. Postcontrast images demonstrate no pathologic enhancement to suggest metastatic disease of the brain or meninges. Vascular: Flow is present in the major intracranial arteries. Skull and upper cervical spine: Skullbase is within normal limits. Midline sagittal structures are unremarkable. Sinuses/Orbits: The paranasal sinuses and mastoid air cells are clear. The globes and orbits are within normal limits. IMPRESSION: 1. No evidence for metastatic disease to the brain or meninges. 2. Progressive atrophy and white matter disease. Electronically Signed   By: CSan MorelleM.D.   On: 03/30/2016 14:50   Nm Pet Image Restag (ps) Skull Base To Thigh  Result Date: 03/12/2016 CLINICAL DATA:  Subsequent treatment strategy for lung carcinoma. The regional diagnosis 2014. Recurrence with biopsy 318. EXAM: NUCLEAR MEDICINE PET SKULL BASE TO THIGH TECHNIQUE: 10.0 mCi F-18 FDG was injected intravenously. Full-ring PET imaging was performed from the skull base to thigh after  the radiotracer. CT data was obtained and used for attenuation correction and anatomic localization. FASTING BLOOD GLUCOSE:  Value: Fifty-four Mg/dl COMPARISON:  CT chest 02/26/2016, PET-CT 09/07/2012 FINDINGS: NECK No hypermetabolic lymph nodes in the neck. CHEST Parahilar masslike thickening along the surgical and brachytherapy therapy site in the LEFT upper lobe is intensely hypermetabolic. Medially lesion measures 4.3 by 2.5 cm with intense metabolic activity (SUV max equal 17.3). Intense metabolic activity extends along the scar to the lateral chest wall pleural surface. Hypermetabolic subcarinal and prevascular lymph nodes are small but with intense metabolic activity for size. For example subcarinal lymph node with SUV max equal 3.5 measures 6 mm short axis. Similar hyper meta prevascular and AP window nodes. No hypermetabolic supraclavicular nodes. There is a single hypermetabolic LEFT axillary lymph node (image 66) of fused data set. Lymph node measuring 7 mm  but with intense metabolic activity for size with SUV max equal 4.6. ABDOMEN/PELVIS No abnormal metabolic activity the liver. Multiple gallstones noted. Adrenal glands are normal. No hypermetabolic abdominopelvic lymph nodes. Single focus of intense activity within the sigmoid colon (image 173 fused data set). No lesion identified on CT portion exam. SKELETON No focal hypermetabolic activity to suggest skeletal metastasis. IMPRESSION: 1. Lung cancer recurrence within the LEFT upper lobe along the surgical improved teeth therapy treatment site. Recurrence extends from the LEFT hilum to the pleural surface. 2. 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 3. High concern for distant nodal metastasis to the LEFT axilla. 4. Single focus uptake in the sigmoid colon without associated CT findings is favored benign. Recommend Attention on follow-up. Electronically Signed   By: Suzy Bouchard M.D.   On:  03/12/2016 11:31    ASSESSMENT AND PLAN: This is a very pleasant 76 years old white male with likely stage IIIa non-small cell lung cancer, squamous cell carcinoma  presented with recurrent disease in the left upper lobe as well as mediastinal lymphadenopathy diagnosed in March 2018. He had recent MRI of the brain that showed no evidence of metastatic disease to the brain. I discussed the results with the patient and his wife. The patient also has suspicious left axillary lymph nodes concerning for disease metastasis. He is scheduled for ultrasound-guided biopsy of this lesion in 2 days. He is expected to start a course of concurrent chemoradiation today but unfortunately his radiotherapy was not scheduled as the patient declined to receive his treatment in [place], [place] and he preferred to receive it in [place]. I spoke to Dr. Sondra Come was covering for Dr. Tammi Klippel today. He will try to get the patient to the radiation oncology department soon for simulation and hopefully starting his course of concurrent chemoradiation next week. I will delay the start of cycle #1 of his chemotherapy until next week. For COPD, the patient will continue with his current inhaler as prescribed by Dr. Melvyn Novas and I started the patient on Medrol Dosepak today. He would come back for follow-up visit in 2 weeks for reevaluation and management of any adverse effect of his treatment. The patient was advised to call immediately if he has any concerning symptoms in the interval. The patient voices understanding of current disease status and treatment options and is in agreement with the current care plan. All questions were answered. The patient knows to call the clinic with any problems, questions or concerns. We can certainly see the patient much sooner if necessary.  I spent 15 minutes counseling the patient face to face. The total time spent in the appointment was 25 minutes.  Disclaimer: This note was dictated with  voice recognition software. Similar sounding words can inadvertently be transcribed and may not be corrected upon review.

## 2016-04-06 ENCOUNTER — Ambulatory Visit
Admission: RE | Admit: 2016-04-06 | Discharge: 2016-04-06 | Disposition: A | Discharge status: Home / Self Care | Payer: Medicare Other | Source: Ambulatory Visit | Attending: Radiation Oncology | Admitting: Radiation Oncology

## 2016-04-06 ENCOUNTER — Other Ambulatory Visit: Payer: Self-pay | Admitting: Student

## 2016-04-06 DIAGNOSIS — C3412 Malignant neoplasm of upper lobe, left bronchus or lung: Secondary | ICD-10-CM | POA: Diagnosis not present

## 2016-04-06 DIAGNOSIS — C3492 Malignant neoplasm of unspecified part of left bronchus or lung: Secondary | ICD-10-CM | POA: Diagnosis not present

## 2016-04-06 DIAGNOSIS — Z510 Encounter for antineoplastic radiation therapy: Secondary | ICD-10-CM | POA: Diagnosis present

## 2016-04-06 DIAGNOSIS — K208 Other esophagitis: Secondary | ICD-10-CM | POA: Diagnosis not present

## 2016-04-06 NOTE — Progress Notes (Signed)
  Radiation Oncology         [phone] ________________________________  Name: Travis [name] MRN: [id number]  Date: 04/06/2016  DOB: 07-18-1940  SIMULATION AND TREATMENT PLANNING NOTE    ICD-9-CM ICD-10-CM   1. Malignant neoplasm of upper lobe of left lung (HCC) 162.3 C34.12   2. Squamous cell carcinoma of lung, stage III, left (HCC) 162.9 C34.92     DIAGNOSIS:  Recurrent non-small cell lung cancer, squamous cell carcinoma, likely stage IIIa (T2b, N2, MX) presented with recurrent disease in the left upper lobe as well as mediastinal lymphadenopathy  NARRATIVE:  The patient was brought to the [facility].  Identity was confirmed.  All relevant records and images related to the planned course of therapy were reviewed.  The patient freely provided informed written consent to proceed with treatment after reviewing the details related to the planned course of therapy. The consent form was witnessed and verified by the simulation staff.  Then, the patient was set-up in a stable reproducible  supine position for radiation therapy.  CT images were obtained.  Surface markings were placed.  The CT images were loaded into the planning software.  Then the target and avoidance structures were contoured.  Treatment planning then occurred.  The radiation prescription was entered and confirmed.  Then, I designed and supervised the construction of a total of 5 medically necessary complex treatment devices.  I have requested : 3D Simulation  I have requested a DVH of the following structures: heart, lungs, spinal cord, GTV, PTV.  I have ordered:CBC  PLAN:  The patient will receive 30 Gy in 15 fractions. Patient will then be reassessed for additional treatment depending on review of his previous brachytherapy dose as well as results of his left axillary biopsy.   Special Treatment Procedure Note: The patient will be receiving radiosensitizing chemotherapy. Given the potential of increased  toxicities related to combined therapy and the necessity for close monitoring of the patient and blood work, this constitutes a special treatment procedure.  -----------------------------------  Blair Promise, PhD, MD  This document serves as a record of services personally performed by Gery Pray, MD. It was created on his behalf by Darcus Austin, a trained medical scribe. The creation of this record is based on the scribe's personal observations and the provider's statements to them. This document has been checked and approved by the attending provider.

## 2016-04-07 ENCOUNTER — Ambulatory Visit (HOSPITAL_COMMUNITY)
Admission: RE | Admit: 2016-04-07 | Discharge: 2016-04-07 | Disposition: A | Discharge status: Home / Self Care | Payer: Medicare Other | Source: Ambulatory Visit | Attending: Internal Medicine | Admitting: Internal Medicine

## 2016-04-07 ENCOUNTER — Encounter (HOSPITAL_COMMUNITY): Payer: Self-pay

## 2016-04-07 DIAGNOSIS — E785 Hyperlipidemia, unspecified: Secondary | ICD-10-CM | POA: Insufficient documentation

## 2016-04-07 DIAGNOSIS — Z8249 Family history of ischemic heart disease and other diseases of the circulatory system: Secondary | ICD-10-CM | POA: Insufficient documentation

## 2016-04-07 DIAGNOSIS — Z7189 Other specified counseling: Secondary | ICD-10-CM

## 2016-04-07 DIAGNOSIS — D649 Anemia, unspecified: Secondary | ICD-10-CM | POA: Diagnosis not present

## 2016-04-07 DIAGNOSIS — I10 Essential (primary) hypertension: Secondary | ICD-10-CM | POA: Diagnosis not present

## 2016-04-07 DIAGNOSIS — Z5111 Encounter for antineoplastic chemotherapy: Secondary | ICD-10-CM

## 2016-04-07 DIAGNOSIS — L409 Psoriasis, unspecified: Secondary | ICD-10-CM | POA: Insufficient documentation

## 2016-04-07 DIAGNOSIS — C3492 Malignant neoplasm of unspecified part of left bronchus or lung: Secondary | ICD-10-CM | POA: Diagnosis not present

## 2016-04-07 DIAGNOSIS — E119 Type 2 diabetes mellitus without complications: Secondary | ICD-10-CM | POA: Diagnosis not present

## 2016-04-07 DIAGNOSIS — R590 Localized enlarged lymph nodes: Secondary | ICD-10-CM | POA: Diagnosis present

## 2016-04-07 DIAGNOSIS — G4733 Obstructive sleep apnea (adult) (pediatric): Secondary | ICD-10-CM | POA: Diagnosis not present

## 2016-04-07 DIAGNOSIS — I480 Paroxysmal atrial fibrillation: Secondary | ICD-10-CM | POA: Diagnosis not present

## 2016-04-07 DIAGNOSIS — Z720 Tobacco use: Secondary | ICD-10-CM | POA: Insufficient documentation

## 2016-04-07 DIAGNOSIS — C773 Secondary and unspecified malignant neoplasm of axilla and upper limb lymph nodes: Secondary | ICD-10-CM | POA: Diagnosis not present

## 2016-04-07 DIAGNOSIS — I2510 Atherosclerotic heart disease of native coronary artery without angina pectoris: Secondary | ICD-10-CM | POA: Diagnosis not present

## 2016-04-07 HISTORY — DX: Type 2 diabetes mellitus without complications: E11.9

## 2016-04-07 HISTORY — DX: Anemia, unspecified: D64.9

## 2016-04-07 HISTORY — DX: Chronic obstructive pulmonary disease, unspecified: J44.9

## 2016-04-07 LAB — PROTIME-INR
INR: 1.13
PROTHROMBIN TIME: 14.5 s (ref 11.4–15.2)

## 2016-04-07 LAB — CBC
HCT: 39.7 % (ref 39.0–52.0)
HEMOGLOBIN: 12.8 g/dL — AB (ref 13.0–17.0)
MCH: 25.3 pg — AB (ref 26.0–34.0)
MCHC: 32.2 g/dL (ref 30.0–36.0)
MCV: 78.5 fL (ref 78.0–100.0)
PLATELETS: 222 10*3/uL (ref 150–400)
RBC: 5.06 MIL/uL (ref 4.22–5.81)
RDW: 16.3 % — ABNORMAL HIGH (ref 11.5–15.5)
WBC: 8.5 10*3/uL (ref 4.0–10.5)

## 2016-04-07 LAB — APTT: aPTT: 31 seconds (ref 24–36)

## 2016-04-07 LAB — GLUCOSE, CAPILLARY: Glucose-Capillary: 136 mg/dL — ABNORMAL HIGH (ref 65–99)

## 2016-04-07 MED ORDER — SODIUM CHLORIDE 0.9 % IV SOLN
INTRAVENOUS | Status: DC
  Administered 2016-04-07: 11:00:00 via INTRAVENOUS

## 2016-04-07 MED ORDER — FENTANYL CITRATE (PF) 100 MCG/2ML IJ SOLN
INTRAMUSCULAR | Status: DC | PRN
  Administered 2016-04-07: 50 ug via INTRAVENOUS

## 2016-04-07 MED ORDER — FENTANYL CITRATE (PF) 100 MCG/2ML IJ SOLN
INTRAMUSCULAR | Status: AC
  Filled 2016-04-07: qty 4

## 2016-04-07 MED ORDER — MIDAZOLAM HCL 2 MG/2ML IJ SOLN
INTRAMUSCULAR | Status: AC
  Filled 2016-04-07: qty 4

## 2016-04-07 MED ORDER — MIDAZOLAM HCL 2 MG/2ML IJ SOLN
INTRAMUSCULAR | Status: DC | PRN
Start: 2016-04-07 — End: 2016-04-08
  Administered 2016-04-07 (×2): 1 mg via INTRAVENOUS

## 2016-04-07 NOTE — Procedures (Signed)
Interventional Radiology Procedure Note  Procedure: US guided biopsy of left axillary lymph node.  3 x 73A core  Complications: None Recommendations:  - Ok to shower tomorrow - Do not submerge for 7 days - Routine care   Signed,  Dulcy Fanny. Earleen Newport, DO

## 2016-04-07 NOTE — H&P (Signed)
Chief Complaint: Patient was seen in consultation today for left axillary lymph node  Referring Physician(s):  Mohamed,Mohamed  Supervising Physician: Corrie Mckusick  Patient Status: [facility] - Out-pt  History of Present Illness: Travis [name] is a 76 y.o. male anemia, CAD, COPD, DM, DM, lung cancer s/p lobectomy, chemo, and radiation therapy who presents with complaint of lymphadenopathy.   IR consulted for left axillary lymph node biopsy at the request of Dr. Julien Nordmann.  Case reviewed by Dr. Barbie Banner who approves patient for procedure.   Patient has been NPO.  He does not take blood thinners.  He has been in his usual state of health- including chronic cough, congestion, and wheezing.   Past Medical History:  Diagnosis Date  . AAA family hx 11/20/2014  . Anemia   . CAD (coronary artery disease)    s/p RCA atherectomy 1994, cath 1999 with occluded RCA with left to right collaterals, PCI of LAD 2007, s/p PCI of LAD for instent thrombosis 2008, PCI 08/2015 with DES to LAD due to ISR with known L-R collaterals to RCA  . COPD (chronic obstructive pulmonary disease) ([place])   . Diabetes mellitus without complication (HCC)    diet controlled   . Dyslipidemia   . Dyspnea   . Dysrhythmia   . Encounter for antineoplastic chemotherapy 03/18/2016  . Goals of care, counseling/discussion 03/18/2016  . Hyperlipidemia   . Hypertension   . Insomnia   . Lung cancer [facility])    s/p lobectomy, chemo and radiation therapy  . OSA (obstructive sleep apnea)    intolerant to CPAP  . PAF (paroxysmal atrial fibrillation) ([place])   . Pneumonia   . Psoriasis   . Squamous cell carcinoma of lung, stage III, left ([place]) 03/18/2016  . Tobacco abuse     Past Surgical History:  Procedure Laterality Date  . APPENDECTOMY    . CARDIAC CATHETERIZATION  1999  . CARDIAC CATHETERIZATION N/A 08/22/2015   Procedure: Left Heart Cath and Coronary Angiography;  Surgeon: Leonie Man, MD;  Location: [place] [facility];   Service: Cardiovascular;  Laterality: N/A;  . CARDIAC CATHETERIZATION N/A 08/22/2015   Procedure: Coronary Stent Intervention;  Surgeon: Leonie Man, MD;  Location: [place] [facility];  Service: Cardiovascular;  Laterality: N/A;  . EXPLORATORY LAPAROTOMY     secondary to trauma  . multiple PCT's to RCA and LAD  2007/2008  . s/p lung lobectomy  2007  . VIDEO BRONCHOSCOPY N/A 08/28/2012   Procedure: VIDEO BRONCHOSCOPY WITHOUT FLUORO;  Surgeon: Elsie Stain, MD;  Location: [place];  Service: Cardiopulmonary;  Laterality: N/A;  . VIDEO BRONCHOSCOPY Bilateral 03/11/2016   Procedure: VIDEO BRONCHOSCOPY WITHOUT FLUORO;  Surgeon: Tanda Rockers, MD;  Location: [facility];  Service: Cardiopulmonary;  Laterality: Bilateral;    Allergies: Gadolinium derivatives  Medications: Prior to Admission medications   Medication Sig Start Date End Date Taking? Authorizing Provider  albuterol (PROAIR HFA) 108 (90 Base) MCG/ACT inhaler Inhale 2 puffs into the lungs every 4 (four) hours as needed for wheezing or shortness of breath.    Yes Historical Provider, MD  clopidogrel (PLAVIX) 75 MG tablet Take 1 tablet (75 mg total) by mouth daily with breakfast. 09/02/15  Yes Dayna N Dunn, PA-C  dextromethorphan-guaiFENesin (MUCINEX DM) 30-600 MG 12hr tablet Take 2 tablets by mouth 2 (two) times daily as needed for cough.    Yes Historical Provider, MD  ferrous sulfate 325 (65 FE) MG tablet Take 325 mg by mouth at  bedtime.    Yes Historical Provider, MD  HYDROcodone-homatropine (HYCODAN) 5-1.5 MG/5ML syrup Take 5 mLs by mouth every 6 (six) hours as needed for cough. 03/25/16  Yes Curt Bears, MD  methylPREDNISolone (MEDROL DOSEPAK) 4 MG TBPK tablet Use as instructed 04/05/16  Yes Curt Bears, MD  MULTAQ 400 MG tablet TAKE 1 TABLET BY MOUTH TWICE A DAY 30 Patient taking differently: TAKE 1 TABLET BY MOUTH TWICE A DAY 10/06/15  Yes Sueanne Margarita, MD  pantoprazole (PROTONIX) 40 MG tablet Take 1 tablet (40 mg  total) by mouth daily. 09/02/15  Yes Dayna N Dunn, PA-C  Tiotropium Bromide-Olodaterol (STIOLTO RESPIMAT) 2.5-2.5 MCG/ACT AERS Inhale 2 puffs into the lungs daily. 02/25/16  Yes Tanda Rockers, MD  traZODone (DESYREL) 100 MG tablet Take 200 mg by mouth at bedtime.    Yes Historical Provider, MD  XARELTO 20 MG TABS tablet Take 20 mg by mouth daily. 02/17/16  Yes Historical Provider, MD  acetaminophen-codeine (TYLENOL #3) 300-30 MG tablet One every 4 hours as needed for cough Patient not taking: Reported on 04/05/2016 03/04/16   Tanda Rockers, MD  famotidine (PEPCID) 20 MG tablet One at bedtime 11/25/15   Tanda Rockers, MD  isosorbide mononitrate (IMDUR) 30 MG 24 hr tablet Take 1 tablet (30 mg total) by mouth daily. Patient taking differently: Take 30 mg by mouth at bedtime.  10/14/15 03/16/16  Bhavinkumar Bhagat, PA  nitroGLYCERIN (NITROSTAT) 0.4 MG SL tablet Place 0.4 mg under the tongue every 5 (five) minutes as needed for chest pain. X 3 doses    Historical Provider, MD  ondansetron (ZOFRAN) 8 MG tablet Take 1 tablet (8 mg total) by mouth every 8 (eight) hours as needed for nausea or vomiting. Patient not taking: Reported on 04/05/2016 03/23/16   Curt Bears, MD  promethazine (PHENERGAN) 25 MG tablet Take 1 tablet (25 mg total) by mouth every 6 (six) hours as needed for nausea or vomiting. Patient not taking: Reported on 04/05/2016 03/29/16   Curt Bears, MD  rosuvastatin (CRESTOR) 10 MG tablet Take 1 tablet (10 mg total) by mouth daily. Patient taking differently: Take 10 mg by mouth at bedtime.  10/21/15 03/16/16  Brittainy Erie Noe, PA-C     Family History  Problem Relation Age of Onset  . Heart attack Father   . Heart disease Mother   . Heart attack Mother   . Coronary artery disease Brother   . Lymphoma Brother   . Breast cancer Sister   . Diabetes Sister   . Heart disease Sister   . Colon cancer Neg Hx   . Stomach cancer Neg Hx     Social History   Social History  . Marital  status: Married    Spouse name: N/A  . Number of children: 3  . Years of education: N/A   Occupational History  . [employer]   Social History Main Topics  . Smoking status: Current Some Day Smoker    Packs/day: 0.25    Years: 58.00    Types: Cigarettes  . Smokeless tobacco: Current User    Types: Snuff  . Alcohol use No  . Drug use: No  . Sexual activity: Not Asked   Other Topics Concern  . None   Social History Narrative  . None    Review of Systems  Constitutional: Negative for diaphoresis and fatigue.  Respiratory: Positive for cough and wheezing. Negative for shortness of breath.   Cardiovascular: Negative for chest  pain.  Psychiatric/Behavioral: Negative for behavioral problems and confusion.    Vital Signs: BP (!) 124/59   Pulse (!) 55   Temp 97.9 F (36.6 C) (Oral)   Resp 16   Ht '5\' 11"'$  (1.803 m)   Wt 197 lb 8 oz (89.6 kg)   SpO2 99%   BMI 27.55 kg/m   Physical Exam  Constitutional: He is oriented to person, place, and time. He appears well-developed.  Cardiovascular: Normal rate, regular rhythm and normal heart sounds.   Pulmonary/Chest: Effort normal. No respiratory distress. He has wheezes. He has rales.  Neurological: He is alert and oriented to person, place, and time.  Skin: Skin is warm and dry.  Psychiatric: He has a normal mood and affect. His behavior is normal. Judgment and thought content normal.  Nursing note and vitals reviewed.   Mallampati Score:  MD Evaluation Airway: WNL Heart: WNL Abdomen: WNL Chest/ Lungs: WNL ASA  Classification: 3 Mallampati/Airway Score: Two  Imaging: Mr Jeri Cos LO Contrast  Result Date: 03/30/2016 CLINICAL DATA:  Lung cancer.  Uncontrollable cough. EXAM: MRI HEAD WITHOUT AND WITH CONTRAST TECHNIQUE: Multiplanar, multiecho pulse sequences of the brain and surrounding structures were obtained without and with intravenous contrast. CONTRAST:  15m MULTIHANCE GADOBENATE DIMEGLUMINE 529 MG/ML  IV SOLN COMPARISON:  CT head without contrast 07/16/2007. MRI brain without and with contrast 10/17/2005. FINDINGS: Brain: Moderate atrophy and white matter disease has progressed since 2007. The ventricles are proportionate to the degree of atrophy. White matter changes extend into the brainstem. No acute infarct, hemorrhage, or mass lesion is present. Postcontrast images demonstrate no pathologic enhancement to suggest metastatic disease of the brain or meninges. Vascular: Flow is present in the major intracranial arteries. Skull and upper cervical spine: Skullbase is within normal limits. Midline sagittal structures are unremarkable. Sinuses/Orbits: The paranasal sinuses and mastoid air cells are clear. The globes and orbits are within normal limits. IMPRESSION: 1. No evidence for metastatic disease to the brain or meninges. 2. Progressive atrophy and white matter disease. Electronically Signed   By: CSan MorelleM.D.   On: 03/30/2016 14:50   Nm Pet Image Restag (ps) Skull Base To Thigh  Result Date: 03/12/2016 CLINICAL DATA:  Subsequent treatment strategy for lung carcinoma. The regional diagnosis 2014. Recurrence with biopsy 318. EXAM: NUCLEAR MEDICINE PET SKULL BASE TO THIGH TECHNIQUE: 10.0 mCi F-18 FDG was injected intravenously. Full-ring PET imaging was performed from the skull base to thigh after the radiotracer. CT data was obtained and used for attenuation correction and anatomic localization. FASTING BLOOD GLUCOSE:  Value: Fifty-four Mg/dl COMPARISON:  CT chest 02/26/2016, PET-CT 09/07/2012 FINDINGS: NECK No hypermetabolic lymph nodes in the neck. CHEST Parahilar masslike thickening along the surgical and brachytherapy therapy site in the LEFT upper lobe is intensely hypermetabolic. Medially lesion measures 4.3 by 2.5 cm with intense metabolic activity (SUV max equal 17.3). Intense metabolic activity extends along the scar to the lateral chest wall pleural surface. Hypermetabolic subcarinal  and prevascular lymph nodes are small but with intense metabolic activity for size. For example subcarinal lymph node with SUV max equal 3.5 measures 6 mm short axis. Similar hyper meta prevascular and AP window nodes. No hypermetabolic supraclavicular nodes. There is a single hypermetabolic LEFT axillary lymph node (image 66) of fused data set. Lymph node measuring 7 mm but with intense metabolic activity for size with SUV max equal 4.6. ABDOMEN/PELVIS No abnormal metabolic activity the liver. Multiple gallstones noted. Adrenal glands are normal. No hypermetabolic abdominopelvic lymph  nodes. Single focus of intense activity within the sigmoid colon (image 173 fused data set). No lesion identified on CT portion exam. SKELETON No focal hypermetabolic activity to suggest skeletal metastasis. IMPRESSION: 1. Lung cancer recurrence within the LEFT upper lobe along the surgical improved teeth therapy treatment site. Recurrence extends from the LEFT hilum to the pleural surface. 2. 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 3. High concern for distant nodal metastasis to the LEFT axilla. 4. Single focus uptake in the sigmoid colon without associated CT findings is favored benign. Recommend Attention on follow-up. Electronically Signed   By: Suzy Bouchard M.D.   On: 03/12/2016 11:31    Labs:  CBC:  Recent Labs  02/26/16 1650 03/18/16 1348 04/05/16 1149 04/07/16 1118  WBC 11.4* 16.3* 10.0 8.5  HGB 13.4 14.1 12.8* 12.8*  HCT 43.0 44.6 40.7 39.7  PLT 198 189 189 222    COAGS:  Recent Labs  08/22/15 1045 04/07/16 1118  INR 1.27 1.13  APTT  --  31    BMP:  Recent Labs  08/23/15 0359 09/02/15 0900 10/17/15 0934 02/25/16 1705 02/26/16 1650 03/18/16 1348 04/05/16 1149  NA 139 141 141 137 137 139 138  K 4.2 4.3 4.4 4.7 4.4 4.7 4.1  CL 108 106 106 104 105  --   --   CO2 '28 27 29 30 24 27 22  '$ GLUCOSE 98 102* 111* 142* 120* 221*  172*  BUN '11 11 10 18 13 '$ 16.6 13.8  CALCIUM 8.4* 8.8 8.8 9.5 9.0 9.9 9.0  CREATININE 1.14 1.09 1.07 1.08 1.09 1.2 1.1  GFRNONAA >60  --   --   --  >60  --   --   GFRAA >60  --   --   --  >60  --   --     LIVER FUNCTION TESTS:  Recent Labs  09/02/15 0900 03/18/16 1348 04/05/16 1149  BILITOT 0.5 0.94 0.67  AST '12 10 11  '$ ALT '9 15 15  '$ ALKPHOS 62 84 73  PROT 6.2 7.0 6.2*  ALBUMIN 3.9 3.7 3.1*    TUMOR MARKERS: No results for input(s): AFPTM, CEA, CA199, CHROMGRNA in the last 8760 hours.  Assessment and Plan: Travis [name] is a 76 y.o. male anemia, CAD, COPD, DM, DM, lung cancer s/p lobectomy, chemo, and radiation therapy who presents with complaint of lymphadenopathy.  IR consulted for left axillary lymph node biopsy at the request of Dr. Julien Nordmann. Case reviewed by Dr. Barbie Banner who approves patient for procedure.  He presents today in his usual state of health.  He has been NPO and does not take blood thinners.  Risks and Benefits discussed with the patient including, but not limited to bleeding, infection, damage to adjacent structures or low yield requiring additional tests. All of the patient's questions were answered, patient is agreeable to proceed. Consent signed and in chart.   Thank you for this interesting consult.  I greatly enjoyed meeting Travis [name] and look forward to participating in their care.  A copy of this report was sent to the requesting provider on this date.  Electronically Signed: Docia Barrier 04/07/2016, 12:34 PM   I spent a total of  30 Minutes   in face to face in clinical consultation, greater than 50% of which was counseling/coordinating care for lymph node biopsy

## 2016-04-07 NOTE — Sedation Documentation (Signed)
Patient is resting comfortably. 

## 2016-04-07 NOTE — Sedation Documentation (Signed)
O2 d/c'd 

## 2016-04-07 NOTE — Discharge Instructions (Signed)
Biopsy Discharge Instructions  The procedure you just had is called a biopsy.  You may feel some discomfort after the local anesthetic wears off.  Your discomfort should improve over the next several days.  AFTER YOUR BIOPSY  Rest for the remainder of the day.  Avoid heavy lifting (more than 10 lb/4.5 kg).  If you have been given a general anesthetic or other medications to help you relax, you should not operate machinery, drive or make legal decisions for 24 hours after your procedure.  Additionally, someone must be available to drive you home.  Only take over-the-counter or prescription medicines for pain, discomfort, or fever as directed by your caregiver.  This can make bleeding worse.  You may resume your usual diet after the procedure.  Avoid alcoholic beverages for 24 hours after your procedure.  Keep the skin around your biopsy site clean and dry.  You may shower after 24 hours.  Cleanse and dry the biopsy site completely after you shower.  Avoid baths and swimming for 72 hours.  Complications are very uncommon after this procedure.  Go to the nearest Emergency Department or contact your caregiver if you develop any of the following symptoms:  Worsening pain  Bleeding  Swelling at the biopsy site  Light headedness or dizziness  Shortness of Breath  Fever or chills Redness or increased pain or swelling at the biopsy site     Moderate Conscious Sedation, Adult, Care After These instructions provide you with information about caring for yourself after your procedure. Your health care provider may also give you more specific instructions. Your treatment has been planned according to current medical practices, but problems sometimes occur. Call your health care provider if you have any problems or questions after your procedure. What can I expect after the procedure? After your procedure, it is common:  To feel sleepy for several hours.  To feel clumsy and have poor  balance for several hours.  To have poor judgment for several hours.  To vomit if you eat too soon. Follow these instructions at home: For at least 24 hours after the procedure:    Do not:  Participate in activities where you could fall or become injured.  Drive.  Use heavy machinery.  Drink alcohol.  Take sleeping pills or medicines that cause drowsiness.  Make important decisions or sign legal documents.  Take care of children on your own.  Rest. Eating and drinking   Follow the diet recommended by your health care provider.  If you vomit:  Drink water, juice, or soup when you can drink without vomiting.  Make sure you have little or no nausea before eating solid foods. General instructions   Have a responsible adult stay with you until you are awake and alert.  Take over-the-counter and prescription medicines only as told by your health care provider.  If you smoke, do not smoke without supervision.  Keep all follow-up visits as told by your health care provider. This is important. Contact a health care provider if:  You keep feeling nauseous or you keep vomiting.  You feel light-headed.  You develop a rash.  You have a fever. Get help right away if:  You have trouble breathing. This information is not intended to replace advice given to you by your health care provider. Make sure you discuss any questions you have with your health care provider. Document Released: 10/11/2012 Document Revised: 05/26/2015 Document Reviewed: 04/12/2015 Elsevier Interactive Patient Education  2017 [employer].

## 2016-04-07 NOTE — Sedation Documentation (Signed)
O2 2l/[place] applied

## 2016-04-12 ENCOUNTER — Other Ambulatory Visit: Payer: Self-pay | Admitting: *Deleted

## 2016-04-12 ENCOUNTER — Ambulatory Visit (HOSPITAL_BASED_OUTPATIENT_CLINIC_OR_DEPARTMENT_OTHER): Payer: Medicare Other

## 2016-04-12 ENCOUNTER — Other Ambulatory Visit (HOSPITAL_BASED_OUTPATIENT_CLINIC_OR_DEPARTMENT_OTHER): Payer: Medicare Other

## 2016-04-12 VITALS — BP 133/82 | HR 60 | Temp 98.4°F | Resp 18

## 2016-04-12 DIAGNOSIS — C3492 Malignant neoplasm of unspecified part of left bronchus or lung: Secondary | ICD-10-CM

## 2016-04-12 DIAGNOSIS — Z5111 Encounter for antineoplastic chemotherapy: Secondary | ICD-10-CM | POA: Diagnosis not present

## 2016-04-12 DIAGNOSIS — C3412 Malignant neoplasm of upper lobe, left bronchus or lung: Secondary | ICD-10-CM

## 2016-04-12 DIAGNOSIS — C3490 Malignant neoplasm of unspecified part of unspecified bronchus or lung: Secondary | ICD-10-CM

## 2016-04-12 LAB — COMPREHENSIVE METABOLIC PANEL WITH GFR
ALT: 14 U/L (ref 0–55)
AST: 9 U/L (ref 5–34)
Albumin: 3.4 g/dL — ABNORMAL LOW (ref 3.5–5.0)
Alkaline Phosphatase: 93 U/L (ref 40–150)
Anion Gap: 11 meq/L (ref 3–11)
BUN: 21.5 mg/dL (ref 7.0–26.0)
CO2: 26 meq/L (ref 22–29)
Calcium: 9.3 mg/dL (ref 8.4–10.4)
Chloride: 103 meq/L (ref 98–109)
Creatinine: 1.2 mg/dL (ref 0.7–1.3)
EGFR: 55 mL/min/{1.73_m2} — ABNORMAL LOW
Glucose: 189 mg/dL — ABNORMAL HIGH (ref 70–140)
Potassium: 4.2 meq/L (ref 3.5–5.1)
Sodium: 139 meq/L (ref 136–145)
Total Bilirubin: 0.43 mg/dL (ref 0.20–1.20)
Total Protein: 6.6 g/dL (ref 6.4–8.3)

## 2016-04-12 LAB — CBC WITH DIFFERENTIAL/PLATELET
BASO%: 0.5 % (ref 0.0–2.0)
Basophils Absolute: 0.1 10*3/uL (ref 0.0–0.1)
EOS%: 1.6 % (ref 0.0–7.0)
Eosinophils Absolute: 0.2 10*3/uL (ref 0.0–0.5)
HCT: 43 % (ref 38.4–49.9)
HGB: 14 g/dL (ref 13.0–17.1)
LYMPH%: 16.7 % (ref 14.0–49.0)
MCH: 25.9 pg — ABNORMAL LOW (ref 27.2–33.4)
MCHC: 32.5 g/dL (ref 32.0–36.0)
MCV: 79.7 fL (ref 79.3–98.0)
MONO#: 1 10*3/uL — ABNORMAL HIGH (ref 0.1–0.9)
MONO%: 7.4 % (ref 0.0–14.0)
NEUT#: 9.8 10*3/uL — ABNORMAL HIGH (ref 1.5–6.5)
NEUT%: 73.8 % (ref 39.0–75.0)
Platelets: 236 10*3/uL (ref 140–400)
RBC: 5.4 10*6/uL (ref 4.20–5.82)
RDW: 18.5 % — ABNORMAL HIGH (ref 11.0–14.6)
WBC: 13.3 10*3/uL — ABNORMAL HIGH (ref 4.0–10.3)
lymph#: 2.2 10*3/uL (ref 0.9–3.3)

## 2016-04-12 LAB — TECHNOLOGIST REVIEW

## 2016-04-12 MED ORDER — PALONOSETRON HCL INJECTION 0.25 MG/5ML
INTRAVENOUS | Status: AC
  Filled 2016-04-12: qty 5

## 2016-04-12 MED ORDER — DIPHENHYDRAMINE HCL 50 MG/ML IJ SOLN
INTRAMUSCULAR | Status: AC
  Filled 2016-04-12: qty 1

## 2016-04-12 MED ORDER — SODIUM CHLORIDE 0.9 % IV SOLN
183.8000 mg | Freq: Once | INTRAVENOUS | Status: AC
  Administered 2016-04-12: 180 mg via INTRAVENOUS
  Filled 2016-04-12: qty 18

## 2016-04-12 MED ORDER — SODIUM CHLORIDE 0.9 % IV SOLN
Freq: Once | INTRAVENOUS | Status: AC
  Administered 2016-04-12: 09:00:00 via INTRAVENOUS

## 2016-04-12 MED ORDER — SODIUM CHLORIDE 0.9 % IV SOLN
20.0000 mg | Freq: Once | INTRAVENOUS | Status: AC
  Administered 2016-04-12: 20 mg via INTRAVENOUS
  Filled 2016-04-12: qty 2

## 2016-04-12 MED ORDER — DIPHENHYDRAMINE HCL 50 MG/ML IJ SOLN
50.0000 mg | Freq: Once | INTRAMUSCULAR | Status: AC
  Administered 2016-04-12: 50 mg via INTRAVENOUS

## 2016-04-12 MED ORDER — PALONOSETRON HCL INJECTION 0.25 MG/5ML
0.2500 mg | Freq: Once | INTRAVENOUS | Status: AC
  Administered 2016-04-12: 0.25 mg via INTRAVENOUS

## 2016-04-12 MED ORDER — HYDROCODONE-HOMATROPINE 5-1.5 MG/5ML PO SYRP: 5.0000 mL | ORAL_SOLUTION | Freq: Four times a day (QID) | ORAL | 0 refills | Status: DC | PRN

## 2016-04-12 MED ORDER — PACLITAXEL CHEMO INJECTION 300 MG/50ML
45.0000 mg/m2 | Freq: Once | INTRAVENOUS | Status: AC
  Administered 2016-04-12: 96 mg via INTRAVENOUS
  Filled 2016-04-12: qty 16

## 2016-04-12 MED ORDER — FAMOTIDINE IN NACL 20-0.9 MG/50ML-% IV SOLN
INTRAVENOUS | Status: AC
  Filled 2016-04-12: qty 50

## 2016-04-12 MED ORDER — BENZONATATE 100 MG PO CAPS: 100.0000 mg | ORAL_CAPSULE | Freq: Three times a day (TID) | ORAL | 0 refills | Status: DC | PRN

## 2016-04-12 MED ORDER — FAMOTIDINE IN NACL 20-0.9 MG/50ML-% IV SOLN
20.0000 mg | Freq: Once | INTRAVENOUS | Status: AC
  Administered 2016-04-12: 20 mg via INTRAVENOUS

## 2016-04-12 NOTE — Patient Instructions (Signed)
[facility] Discharge Instructions for Patients Receiving Chemotherapy  Today you received the following chemotherapy agents Taxol/Carboplatin   To help prevent nausea and vomiting after your treatment, we encourage you to take your nausea medication as prescribed.  If you develop nausea and vomiting that is not controlled by your nausea medication, call the clinic.   BELOW ARE SYMPTOMS THAT SHOULD BE REPORTED IMMEDIATELY:  *FEVER GREATER THAN 100.5 F  *CHILLS WITH OR WITHOUT FEVER  NAUSEA AND VOMITING THAT IS NOT CONTROLLED WITH YOUR NAUSEA MEDICATION  *UNUSUAL SHORTNESS OF BREATH  *UNUSUAL BRUISING OR BLEEDING  TENDERNESS IN MOUTH AND THROAT WITH OR WITHOUT PRESENCE OF ULCERS  *URINARY PROBLEMS  *BOWEL PROBLEMS  UNUSUAL RASH Items with * indicate a potential emergency and should be followed up as soon as possible.  Feel free to call the clinic you have any questions or concerns. The clinic phone number is [phone].  Please show the CHEMO ALERT CARD at check-in to the Emergency Department and triage nurse.   Paclitaxel injection (Taxol) What is this medicine? PACLITAXEL (PAK li TAX el) is a chemotherapy drug. It targets fast dividing cells, like cancer cells, and causes these cells to die. This medicine is used to treat ovarian cancer, breast cancer, and other cancers. This medicine may be used for other purposes; ask your health care provider or pharmacist if you have questions. COMMON BRAND NAME(S): Onxol, Taxol What should I tell my health care provider before I take this medicine? They need to know if you have any of these conditions: -blood disorders -irregular heartbeat -infection (especially a virus infection such as chickenpox, cold sores, or herpes) -liver disease -previous or ongoing radiation therapy -an unusual or allergic reaction to paclitaxel, alcohol, polyoxyethylated castor oil, other chemotherapy agents, other medicines, foods,  dyes, or preservatives -pregnant or trying to get pregnant -breast-feeding How should I use this medicine? This drug is given as an infusion into a vein. It is administered in a hospital or clinic by a specially trained health care professional. Talk to your pediatrician regarding the use of this medicine in children. Special care may be needed. Overdosage: If you think you have taken too much of this medicine contact a poison control center or emergency room at once. NOTE: This medicine is only for you. Do not share this medicine with others. What if I miss a dose? It is important not to miss your dose. Call your doctor or health care professional if you are unable to keep an appointment. What may interact with this medicine? Do not take this medicine with any of the following medications: -disulfiram -metronidazole This medicine may also interact with the following medications: -cyclosporine -diazepam -ketoconazole -medicines to increase blood counts like filgrastim, pegfilgrastim, sargramostim -other chemotherapy drugs like cisplatin, doxorubicin, epirubicin, etoposide, teniposide, vincristine -quinidine -testosterone -vaccines -verapamil Talk to your doctor or health care professional before taking any of these medicines: -acetaminophen -aspirin -ibuprofen -ketoprofen -naproxen This list may not describe all possible interactions. Give your health care provider a list of all the medicines, herbs, non-prescription drugs, or dietary supplements you use. Also tell them if you smoke, drink alcohol, or use illegal drugs. Some items may interact with your medicine. What should I watch for while using this medicine? Your condition will be monitored carefully while you are receiving this medicine. You will need important blood work done while you are taking this medicine. This medicine can cause serious allergic reactions. To reduce your risk you will need to   medicine(s) before  treatment with this medicine. If you experience allergic reactions like skin rash, itching or hives, swelling of the face, lips, or tongue, tell your doctor or health care professional right away. In some cases, you may be given additional medicines to help with side effects. Follow all directions for their use. This drug may make you feel generally unwell. This is not uncommon, as chemotherapy can affect healthy cells as well as cancer cells. Report any side effects. Continue your course of treatment even though you feel ill unless your doctor tells you to stop. Call your doctor or health care professional for advice if you get a fever, chills or sore throat, or other symptoms of a cold or flu. Do not treat yourself. This drug decreases your body's ability to fight infections. Try to avoid being around people who are sick. This medicine may increase your risk to bruise or bleed. Call your doctor or health care professional if you notice any unusual bleeding. Be careful brushing and flossing your teeth or using a toothpick because you may get an infection or bleed more easily. If you have any dental work done, tell your dentist you are receiving this medicine. Avoid taking products that contain aspirin, acetaminophen, ibuprofen, naproxen, or ketoprofen unless instructed by your doctor. These medicines may hide a fever. Do not become pregnant while taking this medicine. Women should inform their doctor if they wish to become pregnant or think they might be pregnant. There is a potential for serious side effects to an unborn child. Talk to your health care professional or pharmacist for more information. Do not breast-feed an infant while taking this medicine. Men are advised not to father a child while receiving this medicine. This product may contain alcohol. Ask your pharmacist or healthcare provider if this medicine contains alcohol. Be sure to tell all healthcare providers you are taking this medicine.  Certain medicines, like metronidazole and disulfiram, can cause an unpleasant reaction when taken with alcohol. The reaction includes flushing, headache, nausea, vomiting, sweating, and increased thirst. The reaction can last from 30 minutes to several hours. What side effects may I notice from receiving this medicine? Side effects that you should report to your doctor or health care professional as soon as possible: -allergic reactions like skin rash, itching or hives, swelling of the face, lips, or tongue -low blood counts - This drug may decrease the number of white blood cells, red blood cells and platelets. You may be at increased risk for infections and bleeding. -signs of infection - fever or chills, cough, sore throat, pain or difficulty passing urine -signs of decreased platelets or bleeding - bruising, pinpoint red spots on the skin, black, tarry stools, nosebleeds -signs of decreased red blood cells - unusually weak or tired, fainting spells, lightheadedness -breathing problems -chest pain -high or low blood pressure -mouth sores -nausea and vomiting -pain, swelling, redness or irritation at the injection site -pain, tingling, numbness in the hands or feet -slow or irregular heartbeat -swelling of the ankle, feet, hands Side effects that usually do not require medical attention (report to your doctor or health care professional if they continue or are bothersome): -bone pain -complete hair loss including hair on your head, underarms, pubic hair, eyebrows, and eyelashes -changes in the color of fingernails -diarrhea -loosening of the fingernails -loss of appetite -muscle or joint pain -red flush to skin -sweating This list may not describe all possible side effects. Call your doctor for medical advice about side effects.  side effects. You may report side effects to FDA at 1-800-FDA-1088. Where should I keep my medicine? This drug is given in a hospital or clinic and will not be  stored at home. NOTE: This sheet is a summary. It may not cover all possible information. If you have questions about this medicine, talk to your doctor, pharmacist, or health care provider.  2018 [employer] Standard (2014-10-22 19:58:00)   Carboplatin injection What is this medicine? CARBOPLATIN (KAR boe pla tin) is a chemotherapy drug. It targets fast dividing cells, like cancer cells, and causes these cells to die. This medicine is used to treat ovarian cancer and many other cancers. This medicine may be used for other purposes; ask your health care provider or pharmacist if you have questions. COMMON BRAND NAME(S): Paraplatin What should I tell my health care provider before I take this medicine? They need to know if you have any of these conditions: -blood disorders -hearing problems -kidney disease -recent or ongoing radiation therapy -an unusual or allergic reaction to carboplatin, cisplatin, other chemotherapy, other medicines, foods, dyes, or preservatives -pregnant or trying to get pregnant -breast-feeding How should I use this medicine? This drug is usually given as an infusion into a vein. It is administered in a hospital or clinic by a specially trained health care professional. Talk to your pediatrician regarding the use of this medicine in children. Special care may be needed. Overdosage: If you think you have taken too much of this medicine contact a poison control center or emergency room at once. NOTE: This medicine is only for you. Do not share this medicine with others. What if I miss a dose? It is important not to miss a dose. Call your doctor or health care professional if you are unable to keep an appointment. What may interact with this medicine? -medicines for seizures -medicines to increase blood counts like filgrastim, pegfilgrastim, sargramostim -some antibiotics like amikacin, gentamicin, neomycin, streptomycin, tobramycin -vaccines Talk to your doctor  or health care professional before taking any of these medicines: -acetaminophen -aspirin -ibuprofen -ketoprofen -naproxen This list may not describe all possible interactions. Give your health care provider a list of all the medicines, herbs, non-prescription drugs, or dietary supplements you use. Also tell them if you smoke, drink alcohol, or use illegal drugs. Some items may interact with your medicine. What should I watch for while using this medicine? Your condition will be monitored carefully while you are receiving this medicine. You will need important blood work done while you are taking this medicine. This drug may make you feel generally unwell. This is not uncommon, as chemotherapy can affect healthy cells as well as cancer cells. Report any side effects. Continue your course of treatment even though you feel ill unless your doctor tells you to stop. In some cases, you may be given additional medicines to help with side effects. Follow all directions for their use. Call your doctor or health care professional for advice if you get a fever, chills or sore throat, or other symptoms of a cold or flu. Do not treat yourself. This drug decreases your body's ability to fight infections. Try to avoid being around people who are sick. This medicine may increase your risk to bruise or bleed. Call your doctor or health care professional if you notice any unusual bleeding. Be careful brushing and flossing your teeth or using a toothpick because you may get an infection or bleed more easily. If you have any dental work done, tell your dentist   you are receiving this medicine. Avoid taking products that contain aspirin, acetaminophen, ibuprofen, naproxen, or ketoprofen unless instructed by your doctor. These medicines may hide a fever. Do not become pregnant while taking this medicine. Women should inform their doctor if they wish to become pregnant or think they might be pregnant. There is a potential  for serious side effects to an unborn child. Talk to your health care professional or pharmacist for more information. Do not breast-feed an infant while taking this medicine. What side effects may I notice from receiving this medicine? Side effects that you should report to your doctor or health care professional as soon as possible: -allergic reactions like skin rash, itching or hives, swelling of the face, lips, or tongue -signs of infection - fever or chills, cough, sore throat, pain or difficulty passing urine -signs of decreased platelets or bleeding - bruising, pinpoint red spots on the skin, black, tarry stools, nosebleeds -signs of decreased red blood cells - unusually weak or tired, fainting spells, lightheadedness -breathing problems -changes in hearing -changes in vision -chest pain -high blood pressure -low blood counts - This drug may decrease the number of white blood cells, red blood cells and platelets. You may be at increased risk for infections and bleeding. -nausea and vomiting -pain, swelling, redness or irritation at the injection site -pain, tingling, numbness in the hands or feet -problems with balance, talking, walking -trouble passing urine or change in the amount of urine Side effects that usually do not require medical attention (report to your doctor or health care professional if they continue or are bothersome): -hair loss -loss of appetite -metallic taste in the mouth or changes in taste This list may not describe all possible side effects. Call your doctor for medical advice about side effects. You may report side effects to FDA at 1-800-FDA-1088. Where should I keep my medicine? This drug is given in a hospital or clinic and will not be stored at home. NOTE: This sheet is a summary. It may not cover all possible information. If you have questions about this medicine, talk to your doctor, pharmacist, or health care provider.  2018 [employer]/Gold Standard  (2007-03-28 14:38:05)   

## 2016-04-12 NOTE — Progress Notes (Signed)
rx walked to pt in infusion room

## 2016-04-13 ENCOUNTER — Encounter (HOSPITAL_COMMUNITY): Payer: Self-pay

## 2016-04-13 DIAGNOSIS — Z510 Encounter for antineoplastic radiation therapy: Secondary | ICD-10-CM | POA: Diagnosis not present

## 2016-04-14 ENCOUNTER — Ambulatory Visit
Admission: RE | Admit: 2016-04-14 | Discharge: 2016-04-14 | Disposition: A | Discharge status: Home / Self Care | Payer: Medicare Other | Source: Ambulatory Visit | Attending: Radiation Oncology | Admitting: Radiation Oncology

## 2016-04-14 ENCOUNTER — Telehealth: Payer: Self-pay | Admitting: Medical Oncology

## 2016-04-14 DIAGNOSIS — Z510 Encounter for antineoplastic radiation therapy: Secondary | ICD-10-CM | POA: Diagnosis not present

## 2016-04-14 NOTE — Telephone Encounter (Signed)
Left message to call back if having any problems from chemo .

## 2016-04-15 ENCOUNTER — Ambulatory Visit
Admission: RE | Admit: 2016-04-15 | Discharge: 2016-04-15 | Disposition: A | Discharge status: Home / Self Care | Payer: Medicare Other | Source: Ambulatory Visit | Attending: Radiation Oncology | Admitting: Radiation Oncology

## 2016-04-15 ENCOUNTER — Telehealth: Payer: Self-pay | Admitting: Medical Oncology

## 2016-04-15 DIAGNOSIS — Z510 Encounter for antineoplastic radiation therapy: Secondary | ICD-10-CM | POA: Diagnosis not present

## 2016-04-15 DIAGNOSIS — C3412 Malignant neoplasm of upper lobe, left bronchus or lung: Secondary | ICD-10-CM

## 2016-04-15 DIAGNOSIS — C3492 Malignant neoplasm of unspecified part of left bronchus or lung: Secondary | ICD-10-CM

## 2016-04-15 NOTE — Progress Notes (Signed)
Pt here for patient teaching.  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  Reviewed areas of pertinence such as diarrhea, nausea and vomiting, sexual and fertility changes, throat changes, urinary and bladder changes, headache, blurry vision, breast tenderness, breast swelling, earaches and taste changes . Pt able to give teach back of use baby wipes, have Imodium on hand and sitz bath,apply Sonafine bid, avoid wearing an under wire bra and to use an electric razor if they must shave. Pt verbalizes understanding of information given and will contact nursing with any questions or concerns.

## 2016-04-15 NOTE — Telephone Encounter (Signed)
err

## 2016-04-16 ENCOUNTER — Ambulatory Visit
Admission: RE | Admit: 2016-04-16 | Discharge: 2016-04-16 | Disposition: A | Discharge status: Home / Self Care | Payer: Medicare Other | Source: Ambulatory Visit | Attending: Radiation Oncology | Admitting: Radiation Oncology

## 2016-04-16 DIAGNOSIS — Z510 Encounter for antineoplastic radiation therapy: Secondary | ICD-10-CM | POA: Diagnosis not present

## 2016-04-19 ENCOUNTER — Ambulatory Visit (HOSPITAL_BASED_OUTPATIENT_CLINIC_OR_DEPARTMENT_OTHER): Payer: Medicare Other | Admitting: Oncology

## 2016-04-19 ENCOUNTER — Encounter: Payer: Self-pay | Admitting: Oncology

## 2016-04-19 ENCOUNTER — Ambulatory Visit
Admission: RE | Admit: 2016-04-19 | Discharge: 2016-04-19 | Disposition: A | Discharge status: Home / Self Care | Payer: Medicare Other | Source: Ambulatory Visit | Attending: Radiation Oncology | Admitting: Radiation Oncology

## 2016-04-19 ENCOUNTER — Other Ambulatory Visit (HOSPITAL_BASED_OUTPATIENT_CLINIC_OR_DEPARTMENT_OTHER): Payer: Medicare Other

## 2016-04-19 ENCOUNTER — Ambulatory Visit (HOSPITAL_BASED_OUTPATIENT_CLINIC_OR_DEPARTMENT_OTHER): Payer: Medicare Other

## 2016-04-19 VITALS — BP 94/69 | HR 87 | Temp 98.2°F | Resp 20

## 2016-04-19 DIAGNOSIS — Z510 Encounter for antineoplastic radiation therapy: Secondary | ICD-10-CM | POA: Diagnosis not present

## 2016-04-19 DIAGNOSIS — C3491 Malignant neoplasm of unspecified part of right bronchus or lung: Secondary | ICD-10-CM | POA: Diagnosis not present

## 2016-04-19 DIAGNOSIS — Z5111 Encounter for antineoplastic chemotherapy: Secondary | ICD-10-CM

## 2016-04-19 DIAGNOSIS — W19XXXA Unspecified fall, initial encounter: Secondary | ICD-10-CM

## 2016-04-19 DIAGNOSIS — C3492 Malignant neoplasm of unspecified part of left bronchus or lung: Secondary | ICD-10-CM

## 2016-04-19 LAB — CBC WITH DIFFERENTIAL/PLATELET
BASO%: 0.1 % (ref 0.0–2.0)
BASOS ABS: 0 10*3/uL (ref 0.0–0.1)
EOS%: 1.8 % (ref 0.0–7.0)
Eosinophils Absolute: 0.2 10*3/uL (ref 0.0–0.5)
HCT: 37.5 % — ABNORMAL LOW (ref 38.4–49.9)
HGB: 12.2 g/dL — ABNORMAL LOW (ref 13.0–17.1)
LYMPH%: 14.2 % (ref 14.0–49.0)
MCH: 26.3 pg — AB (ref 27.2–33.4)
MCHC: 32.5 g/dL (ref 32.0–36.0)
MCV: 81 fL (ref 79.3–98.0)
MONO#: 0.4 10*3/uL (ref 0.1–0.9)
MONO%: 4.7 % (ref 0.0–14.0)
NEUT#: 6.9 10*3/uL — ABNORMAL HIGH (ref 1.5–6.5)
NEUT%: 79.2 % — AB (ref 39.0–75.0)
Platelets: 206 10*3/uL (ref 140–400)
RBC: 4.63 10*6/uL (ref 4.20–5.82)
RDW: 16.2 % — ABNORMAL HIGH (ref 11.0–14.6)
WBC: 8.7 10*3/uL (ref 4.0–10.3)
lymph#: 1.2 10*3/uL (ref 0.9–3.3)
nRBC: 0 % (ref 0–0)

## 2016-04-19 LAB — COMPREHENSIVE METABOLIC PANEL
ALK PHOS: 69 U/L (ref 40–150)
ALT: 15 U/L (ref 0–55)
AST: 11 U/L (ref 5–34)
Albumin: 3 g/dL — ABNORMAL LOW (ref 3.5–5.0)
Anion Gap: 9 mEq/L (ref 3–11)
BUN: 17.2 mg/dL (ref 7.0–26.0)
CALCIUM: 9 mg/dL (ref 8.4–10.4)
CHLORIDE: 105 meq/L (ref 98–109)
CO2: 25 mEq/L (ref 22–29)
Creatinine: 1.1 mg/dL (ref 0.7–1.3)
EGFR: 62 mL/min/{1.73_m2} — AB (ref >90)
GLUCOSE: 154 mg/dL — AB (ref 70–140)
POTASSIUM: 4.5 meq/L (ref 3.5–5.1)
SODIUM: 138 meq/L (ref 136–145)
Total Bilirubin: 0.63 mg/dL (ref 0.20–1.20)
Total Protein: 6.1 g/dL — ABNORMAL LOW (ref 6.4–8.3)

## 2016-04-19 MED ORDER — PALONOSETRON HCL INJECTION 0.25 MG/5ML
0.2500 mg | Freq: Once | INTRAVENOUS | Status: AC
  Administered 2016-04-19: 0.25 mg via INTRAVENOUS

## 2016-04-19 MED ORDER — PACLITAXEL CHEMO INJECTION 300 MG/50ML
45.0000 mg/m2 | Freq: Once | INTRAVENOUS | Status: AC
  Administered 2016-04-19: 96 mg via INTRAVENOUS
  Filled 2016-04-19: qty 16

## 2016-04-19 MED ORDER — PACLITAXEL CHEMO INJECTION 300 MG/50ML
26.0000 mg/m2 | Freq: Once | INTRAVENOUS | Status: AC
  Administered 2016-04-19: 54 mg via INTRAVENOUS
  Filled 2016-04-19: qty 9

## 2016-04-19 MED ORDER — HEPARIN SOD (PORK) LOCK FLUSH 100 UNIT/ML IV SOLN
500.0000 [IU] | Freq: Once | INTRAVENOUS | Status: DC | PRN
  Filled 2016-04-19: qty 5

## 2016-04-19 MED ORDER — DIPHENHYDRAMINE HCL 50 MG/ML IJ SOLN
50.0000 mg | Freq: Once | INTRAMUSCULAR | Status: AC
  Administered 2016-04-19: 50 mg via INTRAVENOUS

## 2016-04-19 MED ORDER — SODIUM CHLORIDE 0.9% FLUSH
10.0000 mL | INTRAVENOUS | Status: DC | PRN
  Filled 2016-04-19: qty 10

## 2016-04-19 MED ORDER — PALONOSETRON HCL INJECTION 0.25 MG/5ML
INTRAVENOUS | Status: AC
  Filled 2016-04-19: qty 5

## 2016-04-19 MED ORDER — FAMOTIDINE IN NACL 20-0.9 MG/50ML-% IV SOLN
INTRAVENOUS | Status: AC
  Filled 2016-04-19: qty 50

## 2016-04-19 MED ORDER — DIPHENHYDRAMINE HCL 50 MG/ML IJ SOLN
INTRAMUSCULAR | Status: AC
  Filled 2016-04-19: qty 1

## 2016-04-19 MED ORDER — SODIUM CHLORIDE 0.9 % IV SOLN
183.8000 mg | Freq: Once | INTRAVENOUS | Status: AC
  Administered 2016-04-19: 180 mg via INTRAVENOUS
  Filled 2016-04-19: qty 18

## 2016-04-19 MED ORDER — FAMOTIDINE IN NACL 20-0.9 MG/50ML-% IV SOLN
20.0000 mg | Freq: Once | INTRAVENOUS | Status: AC
  Administered 2016-04-19: 20 mg via INTRAVENOUS

## 2016-04-19 MED ORDER — SODIUM CHLORIDE 0.9 % IV SOLN
20.0000 mg | Freq: Once | INTRAVENOUS | Status: AC
  Administered 2016-04-19: 20 mg via INTRAVENOUS
  Filled 2016-04-19: qty 2

## 2016-04-19 MED ORDER — SODIUM CHLORIDE 0.9 % IV SOLN
Freq: Once | INTRAVENOUS | Status: AC
  Administered 2016-04-19: 14:00:00 via INTRAVENOUS

## 2016-04-19 NOTE — Progress Notes (Signed)
SYMPTOM MANAGEMENT CLINIC    Chief Complaint: Fall  HPI:  Travis [name] 76 y.o. male diagnosed with stage IIIa lung cancer who is currently undergoing concurrent chemoradiation with weekly carboplatin for an AUC of 2 and paclitaxel 45 mg meter squared. She was in our infusion room today receiving his chemotherapy when he tried to get up to go the bathroom and leaned into what he thought was a wall. However, there was no wall. It was only a curtain. The patient fell unwitnessed to the floor. I was asked to see the patient to evaluate him. The patient denied any pain when I saw him. States that he just limited to the floor and did not land on his hips, knees, or bump his head. Denies dizziness.   No history exists.    Review of Systems  Constitutional: Negative.   HENT: Negative.   Eyes: Negative.   Respiratory: Negative.   Cardiovascular: Negative.   Gastrointestinal: Negative.   Genitourinary: Negative.   Musculoskeletal: Positive for falls. Negative for back pain, joint pain, myalgias and neck pain.  Skin: Negative.   Neurological: Negative.   Endo/Heme/Allergies: Negative.   Psychiatric/Behavioral: Negative.     Past Medical History:  Diagnosis Date  . AAA family hx 11/20/2014  . Anemia   . CAD (coronary artery disease)    s/p RCA atherectomy 1994, cath 1999 with occluded RCA with left to right collaterals, PCI of LAD 2007, s/p PCI of LAD for instent thrombosis 2008, PCI 08/2015 with DES to LAD due to ISR with known L-R collaterals to RCA  . COPD (chronic obstructive pulmonary disease) ([place])   . Diabetes mellitus without complication (HCC)    diet controlled   . Dyslipidemia   . Dyspnea   . Dysrhythmia   . Encounter for antineoplastic chemotherapy 03/18/2016  . Goals of care, counseling/discussion 03/18/2016  . Hyperlipidemia   . Hypertension   . Insomnia   . Lung cancer [facility])    s/p lobectomy, chemo and radiation therapy  . OSA (obstructive sleep apnea)    intolerant to CPAP  . PAF (paroxysmal atrial fibrillation) ([place])   . Pneumonia   . Psoriasis   . Squamous cell carcinoma of lung, stage III, left ([place]) 03/18/2016  . Tobacco abuse     Past Surgical History:  Procedure Laterality Date  . APPENDECTOMY    . CARDIAC CATHETERIZATION  1999  . CARDIAC CATHETERIZATION N/A 08/22/2015   Procedure: Left Heart Cath and Coronary Angiography;  Surgeon: Leonie Man, MD;  Location: [place] [facility];  Service: Cardiovascular;  Laterality: N/A;  . CARDIAC CATHETERIZATION N/A 08/22/2015   Procedure: Coronary Stent Intervention;  Surgeon: Leonie Man, MD;  Location: [place] [facility];  Service: Cardiovascular;  Laterality: N/A;  . EXPLORATORY LAPAROTOMY     secondary to trauma  . multiple PCT's to RCA and LAD  2007/2008  . s/p lung lobectomy  2007  . VIDEO BRONCHOSCOPY N/A 08/28/2012   Procedure: VIDEO BRONCHOSCOPY WITHOUT FLUORO;  Surgeon: Elsie Stain, MD;  Location: [place];  Service: Cardiopulmonary;  Laterality: N/A;  . 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      is allergic to gadolinium derivatives.  Allergies as of 04/19/2016      Reactions   Gadolinium Derivatives Hives, Shortness Of Breath, Itching, Swelling, Cough   Pt had severe hives and whelps with swelling around eyes and facial swelling, sob and wheezing  immed post gad inj          Medication List       Accurate as of 04/19/16  4:46 PM. Always use your most recent med list.          acetaminophen-codeine 300-30 MG tablet Commonly known as:  TYLENOL #3 One every 4 hours as needed for cough   benzonatate 100 MG capsule Commonly known as:  TESSALON Take 1 capsule (100 mg total) by mouth 3 (three) times daily as needed for cough.   clopidogrel 75 MG tablet Commonly known as:  PLAVIX Take 1 tablet (75 mg total) by mouth daily with breakfast.   dextromethorphan-guaiFENesin 30-600 MG 12hr tablet Commonly known as:  MUCINEX DM Take 2 tablets by mouth 2 (two) times daily as needed for cough.   famotidine 20 MG tablet Commonly known as:  PEPCID One at bedtime   ferrous sulfate 325 (65 FE) MG tablet Take 325 mg by mouth at bedtime.   HYDROcodone-homatropine 5-1.5 MG/5ML syrup Commonly known as:  HYCODAN Take 5 mLs by mouth every 6 (six) hours as needed for cough.   isosorbide mononitrate 30 MG 24 hr tablet Commonly known as:  IMDUR Take 1 tablet (30 mg total) by mouth daily.   methylPREDNISolone 4 MG Tbpk tablet Commonly known as:  MEDROL DOSEPAK Use as instructed   MULTAQ 400 MG tablet Generic drug:  dronedarone TAKE 1 TABLET BY MOUTH TWICE A DAY 30   nitroGLYCERIN 0.4 MG SL tablet Commonly known as:  NITROSTAT Place 0.4 mg under the tongue every 5 (five) minutes as needed for chest pain. X 3 doses   ondansetron 8 MG tablet Commonly known as:  ZOFRAN Take 1 tablet (8 mg total) by mouth every 8 (eight) hours as needed for nausea or vomiting.   pantoprazole 40 MG tablet Commonly known as:  PROTONIX Take 1 tablet (40 mg total) by mouth daily.   PROAIR HFA  108 (90 Base) MCG/ACT inhaler Generic drug:  albuterol Inhale 2 puffs into the lungs every 4 (four) hours as needed for wheezing or shortness of breath.   promethazine 25 MG tablet Commonly known as:  PHENERGAN Take 1 tablet (25 mg total) by mouth every 6 (six) hours as needed for nausea or vomiting.   rosuvastatin 10 MG tablet Commonly known as:  CRESTOR Take 1 tablet (10 mg total) by mouth daily.   Tiotropium Bromide-Olodaterol 2.5-2.5 MCG/ACT Aers Commonly known as:  STIOLTO RESPIMAT Inhale 2 puffs into the lungs daily.   traZODone 100 MG tablet Commonly known as:  DESYREL Take 200 mg by mouth at bedtime.   XARELTO 20 MG Tabs tablet Generic drug:  rivaroxaban Take 20 mg by mouth daily.        PHYSICAL EXAMINATION  Oncology Vitals 04/19/2016 04/19/2016  Height - -  Weight - -  Weight (lbs) - -  BMI (kg/m2) - -  Temp - -  Pulse 87 118  Resp 20 26  SpO2 96 97  BSA (m2) - -   BP Readings from Last 2 Encounters:  04/19/16 94/69  04/12/16 133/82    Physical Exam  Constitutional: He is oriented to person, place, and time and well-developed, well-nourished, and in no distress.  HENT:  Head: Normocephalic and atraumatic.  Eyes: Conjunctivae and EOM are normal. Pupils are equal, round, and reactive to light.  Neck: Normal range of motion.  Cardiovascular: Normal rate and regular rhythm.   Pulmonary/Chest: Effort normal and breath sounds normal.  Musculoskeletal: Normal range of motion. He exhibits no edema or tenderness.  Neurological: He is alert and oriented to person, place, and time.  Psychiatric: Mood, memory, affect and judgment normal.  Vitals reviewed.   LABORATORY DATA:. Appointment on 04/19/2016  Component Date Value Ref Range Status  . WBC 04/19/2016 8.7  4.0 - 10.3 10e3/uL Final  . NEUT# 04/19/2016 6.9* 1.5 - 6.5 10e3/uL Final  . HGB 04/19/2016 12.2* 13.0 - 17.1 g/dL Final  . HCT 04/19/2016 37.5* 38.4 - 49.9 % Final  . Platelets 04/19/2016 206  140  - 400 10e3/uL Final  . MCV 04/19/2016 81.0  79.3 - 98.0 fL Final  . MCH 04/19/2016 26.3* 27.2 - 33.4 pg Final  . MCHC 04/19/2016 32.5  32.0 - 36.0 g/dL Final  . RBC 04/19/2016 4.63  4.20 - 5.82 10e6/uL Final  . RDW 04/19/2016 16.2* 11.0 - 14.6 % Final  . lymph# 04/19/2016 1.2  0.9 - 3.3 10e3/uL Final  . MONO# 04/19/2016 0.4  0.1 - 0.9 10e3/uL Final  . Eosinophils Absolute 04/19/2016 0.2  0.0 - 0.5 10e3/uL Final  . Basophils Absolute 04/19/2016 0.0  0.0 - 0.1 10e3/uL Final  . NEUT% 04/19/2016 79.2* 39.0 - 75.0 % Final  . LYMPH% 04/19/2016 14.2  14.0 - 49.0 % Final  . MONO% 04/19/2016 4.7  0.0 - 14.0 % Final  . EOS% 04/19/2016 1.8  0.0 - 7.0 % Final  . BASO% 04/19/2016 0.1  0.0 - 2.0 % Final  . nRBC 04/19/2016 0  0 - 0 % Final  . Sodium 04/19/2016 138  136 - 145 mEq/L Final  . Potassium 04/19/2016 4.5  3.5 - 5.1 mEq/L Final  . Chloride 04/19/2016 105  98 - 109 mEq/L Final  . CO2 04/19/2016 25  22 - 29 mEq/L Final  . Glucose 04/19/2016 154* 70 - 140 mg/dl Final  . BUN 04/19/2016 17.2  7.0 - 26.0 mg/dL Final  . Creatinine 04/19/2016 1.1  0.7 - 1.3 mg/dL Final  . Total Bilirubin 04/19/2016 0.63  0.20 - 1.20 mg/dL Final  . Alkaline Phosphatase 04/19/2016 69  40 - 150 U/L Final  . AST 04/19/2016 11  5 - 34 U/L Final  . ALT 04/19/2016 15  0 - 55 U/L Final  . Total Protein 04/19/2016 6.1* 6.4 - 8.3 g/dL Final  . Albumin 04/19/2016 3.0* 3.5 - 5.0 g/dL Final  . Calcium 04/19/2016 9.0  8.4 - 10.4 mg/dL Final  . Anion Gap 04/19/2016 9  3 - 11 mEq/L Final  . EGFR 04/19/2016 62* >90 ml/min/1.73 m2 Final    RADIOGRAPHIC STUDIES: No results found.  ASSESSMENT/PLAN:    No problem-specific Assessment & Plan notes found for this encounter.  This is a 76 year old gentleman with stage IIIa lung cancer who is currently undergoing concurrent chemoradiation therapy. The patient fell in our infusion room during  his infusion. Not having any apparent injury and range of motion is normal. Suspect fall was  related to the fact that he thought there was a wall to hold onto when in fact there was not. He also received Benadryl as a premedication which may have also factored into his fall.  The patient does not seem to have a residual effects from his fall. I have encouraged him to call us if he develops any pain or any concerns in the next several days.  Patient stated understanding of all instructions; and was in agreement with this plan of care. The patient knows to call the clinic with any problems, questions or concerns.   Total time spent with patient was 10 minutes;  with greater than 50 percent of that time spent in face to face counseling regarding patient's symptoms,  and coordination of care and follow up.    Mikey Bussing, NP 04/19/2016

## 2016-04-19 NOTE — Patient Instructions (Signed)
[facility] Discharge Instructions for Patients Receiving Chemotherapy  Today you received the following chemotherapy agents Taxol and Carboplatin. To help prevent nausea and vomiting after your treatment, we encourage you to take your nausea medication as directed.  If you develop nausea and vomiting that is not controlled by your nausea medication, call the clinic.   BELOW ARE SYMPTOMS THAT SHOULD BE REPORTED IMMEDIATELY:  *FEVER GREATER THAN 100.5 F  *CHILLS WITH OR WITHOUT FEVER  NAUSEA AND VOMITING THAT IS NOT CONTROLLED WITH YOUR NAUSEA MEDICATION  *UNUSUAL SHORTNESS OF BREATH  *UNUSUAL BRUISING OR BLEEDING  TENDERNESS IN MOUTH AND THROAT WITH OR WITHOUT PRESENCE OF ULCERS  *URINARY PROBLEMS  *BOWEL PROBLEMS  UNUSUAL RASH Items with * indicate a potential emergency and should be followed up as soon as possible.  Feel free to call the clinic you have any questions or concerns. The clinic phone number is [phone].  Please show the CHEMO ALERT CARD at check-in to the Emergency Department and triage nurse.    

## 2016-04-19 NOTE — Progress Notes (Signed)
Patient retroactively told this RN that he had been needing to use the restroom before his fall. The patient's wife and coverage RN had walked away for a minute when the patient attempted to grab the wall, as witnessed by Meribeth Mattes, RN. Per Amy, the patient grabbed for the curtain and was leaning against the wall in the midst of falling to the floor. This RN came around the corner just after hearing the patient fall and witnessed him on his hands and knees. Patient's wife noticed that his chemotherapy line was detached from the IV site, and this RN clamped the line to decrease the amount of chemo spill. No spill on patient noted. Etta Grandchild, RN on scene to create boundaries around the chemo spill and clean prior to the arrival of environmental services. Patient refused to remain in this position and immediately stood up. The patient had no complaint of pain, shortness of breath, or decreased mobility. At 1526 his VS were taken: HR 118, SpO2 97%, and BP 169/140. Charlestine Massed, NP called during the event and arrived as VS were finished, but the patient refused to be assessed at that time or use a urinal. He went to the restroom, and Mendel Ryder, NP returned after the patient came back to his recliner. No additional treatment at this time. This RN discussed plan of action regarding chemotherapy with Dr. Julien Nordmann and pharmacy. Calculated remaining paclitaxel and new mixture provided for restart at 1605. At 1604 VS were retaken: HR 87, SpO2 96%, and 94/69. Patient status unchanged except for sleepiness. No pain, shortness of breath, or change in mobility noted. Patient's RN was told about incident upon return from lunch.   No complaints upon d/c. Reinforced importance to call from home with any changes in mobility, pain, breathing, or any other abnormality. Pt and wife verbalized understanding.

## 2016-04-20 ENCOUNTER — Ambulatory Visit
Admission: RE | Admit: 2016-04-20 | Discharge: 2016-04-20 | Disposition: A | Discharge status: Home / Self Care | Payer: Medicare Other | Source: Ambulatory Visit | Attending: Radiation Oncology | Admitting: Radiation Oncology

## 2016-04-20 DIAGNOSIS — Z510 Encounter for antineoplastic radiation therapy: Secondary | ICD-10-CM | POA: Diagnosis not present

## 2016-04-21 ENCOUNTER — Ambulatory Visit
Admission: RE | Admit: 2016-04-21 | Discharge: 2016-04-21 | Disposition: A | Discharge status: Home / Self Care | Payer: Medicare Other | Source: Ambulatory Visit | Attending: Radiation Oncology | Admitting: Radiation Oncology

## 2016-04-21 DIAGNOSIS — Z510 Encounter for antineoplastic radiation therapy: Secondary | ICD-10-CM | POA: Diagnosis not present

## 2016-04-22 ENCOUNTER — Ambulatory Visit
Admission: RE | Admit: 2016-04-22 | Discharge: 2016-04-22 | Disposition: A | Discharge status: Home / Self Care | Payer: Medicare Other | Source: Ambulatory Visit | Attending: Radiation Oncology | Admitting: Radiation Oncology

## 2016-04-22 DIAGNOSIS — Z510 Encounter for antineoplastic radiation therapy: Secondary | ICD-10-CM | POA: Diagnosis not present

## 2016-04-23 ENCOUNTER — Ambulatory Visit
Admission: RE | Admit: 2016-04-23 | Discharge: 2016-04-23 | Disposition: A | Discharge status: Home / Self Care | Payer: Medicare Other | Source: Ambulatory Visit | Attending: Radiation Oncology | Admitting: Radiation Oncology

## 2016-04-23 DIAGNOSIS — Z510 Encounter for antineoplastic radiation therapy: Secondary | ICD-10-CM | POA: Diagnosis not present

## 2016-04-26 ENCOUNTER — Ambulatory Visit
Admission: RE | Admit: 2016-04-26 | Discharge: 2016-04-26 | Disposition: A | Discharge status: Home / Self Care | Payer: Medicare Other | Source: Ambulatory Visit | Attending: Radiation Oncology | Admitting: Radiation Oncology

## 2016-04-26 ENCOUNTER — Ambulatory Visit (HOSPITAL_BASED_OUTPATIENT_CLINIC_OR_DEPARTMENT_OTHER): Payer: Medicare Other | Admitting: Internal Medicine

## 2016-04-26 ENCOUNTER — Ambulatory Visit (HOSPITAL_BASED_OUTPATIENT_CLINIC_OR_DEPARTMENT_OTHER): Payer: Medicare Other

## 2016-04-26 ENCOUNTER — Other Ambulatory Visit (HOSPITAL_BASED_OUTPATIENT_CLINIC_OR_DEPARTMENT_OTHER): Payer: Medicare Other

## 2016-04-26 ENCOUNTER — Encounter: Payer: Self-pay | Admitting: Internal Medicine

## 2016-04-26 VITALS — BP 99/45 | HR 74 | Temp 98.3°F | Resp 18 | Ht 71.0 in | Wt 205.6 lb

## 2016-04-26 DIAGNOSIS — Z5111 Encounter for antineoplastic chemotherapy: Secondary | ICD-10-CM | POA: Diagnosis not present

## 2016-04-26 DIAGNOSIS — C3412 Malignant neoplasm of upper lobe, left bronchus or lung: Secondary | ICD-10-CM

## 2016-04-26 DIAGNOSIS — Z7189 Other specified counseling: Secondary | ICD-10-CM

## 2016-04-26 DIAGNOSIS — J449 Chronic obstructive pulmonary disease, unspecified: Secondary | ICD-10-CM

## 2016-04-26 DIAGNOSIS — R0602 Shortness of breath: Secondary | ICD-10-CM | POA: Diagnosis not present

## 2016-04-26 DIAGNOSIS — R05 Cough: Secondary | ICD-10-CM

## 2016-04-26 DIAGNOSIS — R062 Wheezing: Secondary | ICD-10-CM

## 2016-04-26 DIAGNOSIS — C3492 Malignant neoplasm of unspecified part of left bronchus or lung: Secondary | ICD-10-CM

## 2016-04-26 DIAGNOSIS — C773 Secondary and unspecified malignant neoplasm of axilla and upper limb lymph nodes: Secondary | ICD-10-CM | POA: Diagnosis not present

## 2016-04-26 DIAGNOSIS — I10 Essential (primary) hypertension: Secondary | ICD-10-CM

## 2016-04-26 DIAGNOSIS — Z510 Encounter for antineoplastic radiation therapy: Secondary | ICD-10-CM | POA: Diagnosis not present

## 2016-04-26 LAB — CBC WITH DIFFERENTIAL/PLATELET
BASO%: 0.7 % (ref 0.0–2.0)
BASOS ABS: 0 10*3/uL (ref 0.0–0.1)
EOS%: 2.2 % (ref 0.0–7.0)
Eosinophils Absolute: 0.1 10*3/uL (ref 0.0–0.5)
HCT: 33.4 % — ABNORMAL LOW (ref 38.4–49.9)
HGB: 10.6 g/dL — ABNORMAL LOW (ref 13.0–17.1)
LYMPH#: 0.5 10*3/uL — AB (ref 0.9–3.3)
LYMPH%: 16.5 % (ref 14.0–49.0)
MCH: 26 pg — AB (ref 27.2–33.4)
MCHC: 31.7 g/dL — AB (ref 32.0–36.0)
MCV: 82.1 fL (ref 79.3–98.0)
MONO#: 0.2 10*3/uL (ref 0.1–0.9)
MONO%: 7.7 % (ref 0.0–14.0)
NEUT#: 2 10*3/uL (ref 1.5–6.5)
NEUT%: 72.9 % (ref 39.0–75.0)
NRBC: 1 % — AB (ref 0–0)
Platelets: 182 10*3/uL (ref 140–400)
RBC: 4.07 10*6/uL — AB (ref 4.20–5.82)
RDW: 16.7 % — AB (ref 11.0–14.6)
WBC: 2.7 10*3/uL — ABNORMAL LOW (ref 4.0–10.3)

## 2016-04-26 LAB — TECHNOLOGIST REVIEW

## 2016-04-26 LAB — COMPREHENSIVE METABOLIC PANEL
ALT: 14 U/L (ref 0–55)
AST: 13 U/L (ref 5–34)
Albumin: 2.9 g/dL — ABNORMAL LOW (ref 3.5–5.0)
Alkaline Phosphatase: 64 U/L (ref 40–150)
Anion Gap: 8 mEq/L (ref 3–11)
BUN: 12.8 mg/dL (ref 7.0–26.0)
CHLORIDE: 108 meq/L (ref 98–109)
CO2: 25 mEq/L (ref 22–29)
Calcium: 8.6 mg/dL (ref 8.4–10.4)
Creatinine: 1 mg/dL (ref 0.7–1.3)
EGFR: 69 mL/min/{1.73_m2} — AB (ref >90)
GLUCOSE: 169 mg/dL — AB (ref 70–140)
POTASSIUM: 5.2 meq/L — AB (ref 3.5–5.1)
SODIUM: 141 meq/L (ref 136–145)
Total Bilirubin: 0.48 mg/dL (ref 0.20–1.20)
Total Protein: 5.7 g/dL — ABNORMAL LOW (ref 6.4–8.3)

## 2016-04-26 MED ORDER — DIPHENHYDRAMINE HCL 50 MG/ML IJ SOLN
INTRAMUSCULAR | Status: AC
  Filled 2016-04-26: qty 1

## 2016-04-26 MED ORDER — FAMOTIDINE IN NACL 20-0.9 MG/50ML-% IV SOLN
INTRAVENOUS | Status: AC
  Filled 2016-04-26: qty 50

## 2016-04-26 MED ORDER — PACLITAXEL CHEMO INJECTION 300 MG/50ML
45.0000 mg/m2 | Freq: Once | INTRAVENOUS | Status: AC
  Administered 2016-04-26: 96 mg via INTRAVENOUS
  Filled 2016-04-26: qty 16

## 2016-04-26 MED ORDER — SODIUM CHLORIDE 0.9 % IV SOLN
20.0000 mg | Freq: Once | INTRAVENOUS | Status: AC
  Administered 2016-04-26: 20 mg via INTRAVENOUS
  Filled 2016-04-26: qty 2

## 2016-04-26 MED ORDER — SODIUM CHLORIDE 0.9 % IV SOLN
183.8000 mg | Freq: Once | INTRAVENOUS | Status: AC
  Administered 2016-04-26: 180 mg via INTRAVENOUS
  Filled 2016-04-26: qty 18

## 2016-04-26 MED ORDER — DIPHENHYDRAMINE HCL 50 MG/ML IJ SOLN
50.0000 mg | Freq: Once | INTRAMUSCULAR | Status: AC
  Administered 2016-04-26: 50 mg via INTRAVENOUS

## 2016-04-26 MED ORDER — SODIUM CHLORIDE 0.9 % IV SOLN
Freq: Once | INTRAVENOUS | Status: AC
  Administered 2016-04-26: 11:00:00 via INTRAVENOUS

## 2016-04-26 MED ORDER — FAMOTIDINE IN NACL 20-0.9 MG/50ML-% IV SOLN
20.0000 mg | Freq: Once | INTRAVENOUS | Status: AC
  Administered 2016-04-26: 20 mg via INTRAVENOUS

## 2016-04-26 MED ORDER — METHYLPREDNISOLONE 4 MG PO TBPK
ORAL_TABLET | ORAL | 0 refills | Status: DC
Start: 2016-04-26 — End: 2016-05-07

## 2016-04-26 MED ORDER — PALONOSETRON HCL INJECTION 0.25 MG/5ML
INTRAVENOUS | Status: AC
  Filled 2016-04-26: qty 5

## 2016-04-26 MED ORDER — PALONOSETRON HCL INJECTION 0.25 MG/5ML
0.2500 mg | Freq: Once | INTRAVENOUS | Status: AC
Start: 2016-04-26 — End: 2016-04-26
  Administered 2016-04-26: 0.25 mg via INTRAVENOUS

## 2016-04-26 NOTE — Patient Instructions (Signed)
[facility] Discharge Instructions for Patients Receiving Chemotherapy  Today you received the following chemotherapy agents taxol/carboplatin  To help prevent nausea and vomiting after your treatment, we encourage you to take your nausea medication as directed   If you develop nausea and vomiting that is not controlled by your nausea medication, call the clinic.   BELOW ARE SYMPTOMS THAT SHOULD BE REPORTED IMMEDIATELY:  *FEVER GREATER THAN 100.5 F  *CHILLS WITH OR WITHOUT FEVER  NAUSEA AND VOMITING THAT IS NOT CONTROLLED WITH YOUR NAUSEA MEDICATION  *UNUSUAL SHORTNESS OF BREATH  *UNUSUAL BRUISING OR BLEEDING  TENDERNESS IN MOUTH AND THROAT WITH OR WITHOUT PRESENCE OF ULCERS  *URINARY PROBLEMS  *BOWEL PROBLEMS  UNUSUAL RASH Items with * indicate a potential emergency and should be followed up as soon as possible.  Feel free to call the clinic you have any questions or concerns. The clinic phone number is [phone].  

## 2016-04-26 NOTE — Progress Notes (Signed)
DISCONTINUE ON PATHWAY REGIMEN - Non-Small Cell Lung     Administer weekly:     Paclitaxel      Carboplatin   **Always confirm dose/schedule in your pharmacy ordering system**    REASON: Other Reason PRIOR TREATMENT: [id number]: Carboplatin AUC=2 + Paclitaxel 45 mg/m2 Weekly During Radiation TREATMENT RESPONSE: N/A - Adjuvant Therapy  START ON PATHWAY REGIMEN - Non-Small Cell Lung     A cycle is every 21 days:     Paclitaxel      Carboplatin   **Always confirm dose/schedule in your pharmacy ordering system**    Patient Characteristics: Stage IV Metastatic, Squamous, PS = 0, 1, First Line, PD-L1 Expression Positive 1-49% (TPS) / Negative / Not Tested / Not a Candidate for Immunotherapy AJCC T Category: T1b Current Disease Status: Distant Metastases AJCC N Category: N2 AJCC M Category: M1b AJCC 8 Stage Grouping: IVA Histology: Squamous Cell Line of therapy: First Line PD-L1 Expression Status: PD-L1 Positive 1-49% (TPS) Performance Status: PS = 0, 1 Would you be surprised if this patient died  in the next year? I would NOT be surprised if this patient died in the next year  Intent of Therapy: Non-Curative / Palliative Intent, Discussed with Patient

## 2016-04-26 NOTE — Progress Notes (Signed)
[place] [phone]   [phone]  OFFICE PROGRESS NOTE  Corine Shelter, PA-C [address] [place] [address]  DIAGNOSIS: Stage IV (T2b, N2, M1b) presented with left upper lobe lung nodule in addition to mediastinal lymphadenopathy as well as metastatic disease to the left axillary lymph nodes diagnosed in March 2018. PDL 1 is 10%  PRIOR THERAPY: None  CURRENT THERAPY: Short course of Concurrent chemoradiation with weekly carboplatin for AUC of 2 and paclitaxel 45 MG/M2. First dose 04/12/2016. Status post 2 cycles.  INTERVAL HISTORY: Travis [name] 76 y.o. male returns to the clinic today for follow-up visit accompanied by his grandson. The patient continues to complain of persistent cough and shortness of breath at baseline and increased with exertion. He also has a lot of wheezes. He is currently on albuterol inhaler and recently completed Medrol Dosepak with some improvement. He is also on Mucinex and Tessalon for cough. He is currently undergoing concurrent chemoradiation with weekly carboplatin and paclitaxel is status post 2 cycles. He is tolerating this treatment well except for fatigue. He denied having any nausea or vomiting. He denied having any fever or chills. He denied having any weight loss or night sweats. He underwent ultrasound-guided biopsy of the left axillary hypermetabolic lymph node and unfortunately the biopsy was consistent with metastatic poorly differentiated carcinoma. PDL 1 was 10%. The patient is here today for evaluation before starting cycle #3 of his treatment.  MEDICAL HISTORY: Past Medical History:  Diagnosis Date  . AAA family hx 11/20/2014  . Anemia   . CAD (coronary artery disease)    s/p RCA atherectomy 1994, cath 1999 with occluded RCA with left to right collaterals, PCI of LAD 2007, s/p PCI of LAD for instent thrombosis 2008, PCI 08/2015 with DES to LAD due to ISR with known L-R collaterals to RCA    . COPD (chronic obstructive pulmonary disease) ([place])   . Diabetes mellitus without complication (HCC)    diet controlled   . Dyslipidemia   . Dyspnea   . Dysrhythmia   . Encounter for antineoplastic chemotherapy 03/18/2016  . Goals of care, counseling/discussion 03/18/2016  . Hyperlipidemia   . Hypertension   . Insomnia   . Lung cancer [facility])    s/p lobectomy, chemo and radiation therapy  . OSA (obstructive sleep apnea)    intolerant to CPAP  . PAF (paroxysmal atrial fibrillation) ([place])   . Pneumonia   . Psoriasis   . Squamous cell carcinoma of lung, stage III, left ([name]) 03/18/2016  . Tobacco abuse     ALLERGIES:  is allergic to gadolinium derivatives.  MEDICATIONS:  Current Outpatient Prescriptions  Medication Sig Dispense Refill  . albuterol (PROAIR HFA) 108 (90 Base) MCG/ACT inhaler Inhale 2 puffs into the lungs every 4 (four) hours as needed for wheezing or shortness of breath.     . clopidogrel (PLAVIX) 75 MG tablet Take 1 tablet (75 mg total) by mouth daily with breakfast. 90 tablet 3  . ferrous sulfate 325 (65 FE) MG tablet Take 325 mg by mouth at bedtime.     Marland Kitchen HYDROcodone-homatropine (HYCODAN) 5-1.5 MG/5ML syrup Take 5 mLs by mouth every 6 (six) hours as needed for cough. 120 mL 0  . methylPREDNISolone (MEDROL DOSEPAK) 4 MG TBPK tablet Use as instructed 21 tablet 0  . MULTAQ 400 MG tablet TAKE 1 TABLET BY MOUTH TWICE A DAY 30 (Patient taking differently: TAKE 1 TABLET BY MOUTH TWICE A  DAY) 60 tablet 10  . ondansetron (ZOFRAN) 8 MG tablet Take 1 tablet (8 mg total) by mouth every 8 (eight) hours as needed for nausea or vomiting. 20 tablet 2  . pantoprazole (PROTONIX) 40 MG tablet Take 1 tablet (40 mg total) by mouth daily. 90 tablet 3  . Tiotropium Bromide-Olodaterol (STIOLTO RESPIMAT) 2.5-2.5 MCG/ACT AERS Inhale 2 puffs into the lungs daily. 1 Inhaler 0  . traZODone (DESYREL) 100 MG tablet Take 200 mg by mouth at bedtime.     Alveda Reasons 20 MG TABS tablet Take 20 mg by  mouth daily.  3  . acetaminophen-codeine (TYLENOL #3) 300-30 MG tablet One every 4 hours as needed for cough (Patient not taking: Reported on 04/05/2016) 40 tablet 0  . benzonatate (TESSALON) 100 MG capsule Take 1 capsule (100 mg total) by mouth 3 (three) times daily as needed for cough. (Patient not taking: Reported on 04/26/2016) 20 capsule 0  . dextromethorphan-guaiFENesin (MUCINEX DM) 30-600 MG 12hr tablet Take 2 tablets by mouth 2 (two) times daily as needed for cough.     . famotidine (PEPCID) 20 MG tablet One at bedtime (Patient not taking: Reported on 04/15/2016)    . isosorbide mononitrate (IMDUR) 30 MG 24 hr tablet Take 1 tablet (30 mg total) by mouth daily. (Patient taking differently: Take 30 mg by mouth at bedtime. ) 90 tablet 3  . nitroGLYCERIN (NITROSTAT) 0.4 MG SL tablet Place 0.4 mg under the tongue every 5 (five) minutes as needed for chest pain. X 3 doses    . promethazine (PHENERGAN) 25 MG tablet Take 1 tablet (25 mg total) by mouth every 6 (six) hours as needed for nausea or vomiting. (Patient not taking: Reported on 04/26/2016) 30 tablet 0  . rosuvastatin (CRESTOR) 10 MG tablet Take 1 tablet (10 mg total) by mouth daily. (Patient taking differently: Take 10 mg by mouth at bedtime. ) 90 tablet 3   No current facility-administered medications for this visit.     SURGICAL HISTORY:  Past Surgical History:  Procedure Laterality Date  . APPENDECTOMY    . CARDIAC CATHETERIZATION  1999  . CARDIAC CATHETERIZATION N/A 08/22/2015   Procedure: Left Heart Cath and Coronary Angiography;  Surgeon: Leonie Man, MD;  Location: [facility];  Service: Cardiovascular;  Laterality: N/A;  . CARDIAC CATHETERIZATION N/A 08/22/2015   Procedure: Coronary Stent Intervention;  Surgeon: Leonie Man, MD;  Location: [facility];  Service: Cardiovascular;  Laterality: N/A;  . EXPLORATORY LAPAROTOMY     secondary to trauma  . multiple PCT's to RCA and LAD  2007/2008  . s/p lung lobectomy   2007  . VIDEO BRONCHOSCOPY N/A 08/28/2012   Procedure: VIDEO BRONCHOSCOPY WITHOUT FLUORO;  Surgeon: Elsie Stain, MD;  Location: [facility];  Service: Cardiopulmonary;  Laterality: N/A;  . VIDEO BRONCHOSCOPY Bilateral 03/11/2016   Procedure: VIDEO BRONCHOSCOPY WITHOUT FLUORO;  Surgeon: Tanda Rockers, MD;  Location: [facility];  Service: Cardiopulmonary;  Laterality: Bilateral;    REVIEW OF SYSTEMS:  Constitutional: positive for fatigue Eyes: negative Ears, nose, mouth, throat, and face: negative Respiratory: positive for cough, dyspnea on exertion and wheezing Cardiovascular: negative Gastrointestinal: negative Genitourinary:negative Integument/breast: negative Hematologic/lymphatic: negative Musculoskeletal:negative Neurological: negative Behavioral/Psych: negative Endocrine: negative Allergic/Immunologic: negative   PHYSICAL EXAMINATION: General appearance: alert, cooperative, fatigued and no distress Head: Normocephalic, without obvious abnormality, atraumatic Neck: no adenopathy, no JVD, supple, symmetrical, trachea midline and thyroid not enlarged, symmetric, no tenderness/mass/nodules Lymph nodes: Cervical, supraclavicular, and axillary nodes  normal. Resp: wheezes bilaterally Back: symmetric, no curvature. ROM normal. No CVA tenderness. Cardio: regular rate and rhythm, S1, S2 normal, no murmur, click, rub or gallop GI: soft, non-tender; bowel sounds normal; no masses,  no organomegaly Extremities: extremities normal, atraumatic, no cyanosis or edema Neurologic: Alert and oriented X 3, normal strength and tone. Normal symmetric reflexes. Normal coordination and gait  ECOG PERFORMANCE STATUS: 1 - Symptomatic but completely ambulatory  Blood pressure (!) 99/45, pulse 74, temperature 98.3 F (36.8 C), temperature source Oral, resp. rate 18, height '5\' 11"'$  (1.803 m), weight 205 lb 9.6 oz (93.3 kg), SpO2 98 %.  LABORATORY DATA: Lab Results  Component Value Date   WBC  2.7 (L) 04/26/2016   HGB 10.6 (L) 04/26/2016   HCT 33.4 (L) 04/26/2016   MCV 82.1 04/26/2016   PLT 182 04/26/2016      Chemistry      Component Value Date/Time   NA 141 04/26/2016 0935   K 5.2 (H) 04/26/2016 0935   CL 105 02/26/2016 1650   CO2 25 04/26/2016 0935   BUN 12.8 04/26/2016 0935   CREATININE 1.0 04/26/2016 0935      Component Value Date/Time   CALCIUM 8.6 04/26/2016 0935   ALKPHOS 64 04/26/2016 0935   AST 13 04/26/2016 0935   ALT 14 04/26/2016 0935   BILITOT 0.48 04/26/2016 0935       RADIOGRAPHIC STUDIES: Mr Jeri Cos JJ Contrast  Result Date: 03/30/2016 CLINICAL DATA:  Lung cancer.  Uncontrollable cough. EXAM: MRI HEAD WITHOUT AND WITH CONTRAST TECHNIQUE: Multiplanar, multiecho pulse sequences of the brain and surrounding structures were obtained without and with intravenous contrast. CONTRAST:  24m MULTIHANCE GADOBENATE DIMEGLUMINE 529 MG/ML IV SOLN COMPARISON:  CT head without contrast 07/16/2007. MRI brain without and with contrast 10/17/2005. FINDINGS: Brain: Moderate atrophy and white matter disease has progressed since 2007. The ventricles are proportionate to the degree of atrophy. White matter changes extend into the brainstem. No acute infarct, hemorrhage, or mass lesion is present. Postcontrast images demonstrate no pathologic enhancement to suggest metastatic disease of the brain or meninges. Vascular: Flow is present in the major intracranial arteries. Skull and upper cervical spine: Skullbase is within normal limits. Midline sagittal structures are unremarkable. Sinuses/Orbits: The paranasal sinuses and mastoid air cells are clear. The globes and orbits are within normal limits. IMPRESSION: 1. No evidence for metastatic disease to the brain or meninges. 2. Progressive atrophy and white matter disease. Electronically Signed   By: CSan MorelleM.D.   On: 03/30/2016 14:50   [place]  Result Date: 04/07/2016 INDICATION: 76year old male with a history of  lung carcinoma. FDG positive left axial lymph node. EXAM: ULTRASOUND-GUIDED BIOPSY LEFT AXILLARY LYMPH NODE MEDICATIONS: None. ANESTHESIA/SEDATION: Moderate (conscious) sedation was employed during this procedure. A total of Versed 2.0 mg and Fentanyl 50 mcg was administered intravenously. Moderate Sedation Time: 12 minutes. The patient's level of consciousness and vital signs were monitored continuously by radiology nursing throughout the procedure under my direct supervision. FLUOROSCOPY TIME:  None COMPLICATIONS: None PROCEDURE: Informed written consent was obtained from the patient after a thorough discussion of the procedural risks, benefits and alternatives. All questions were addressed. Maximal Sterile Barrier Technique was utilized including caps, mask, sterile gowns, sterile gloves, sterile drape, hand hygiene and skin antiseptic. A timeout was performed prior to the initiation of the procedure. Patient positioned supine position with the left arm abducted. Patient is prepped and draped in the usual sterile fashion. Images were stored and sent  to PACs. 1% lidocaine was then the used for local anesthesia. Using ultrasound guidance, multiple 18 gauge core biopsy were achieved. Patient tolerated the procedure well and remained hemodynamically stable throughout. No complications were encountered and no significant blood loss encountered. IMPRESSION: Status post ultrasound-guided biopsy left axillary lymph node. Tissue specimen sent to pathology for complete histopathologic analysis. Signed, Dulcy Fanny. Earleen Newport, DO [facility] Radiology Electronically Signed   By: Corrie Mckusick D.O.   On: 04/07/2016 15:11    ASSESSMENT AND PLAN:  This is a very pleasant 76 years old white male white male with stage IV non-small cell lung cancer, squamous cell carcinoma presented with recurrent disease in the left upper lobe as well as mediastinal lymphadenopathy and metastatic  disease in the left axillary lymph node. The patient was started on a course of concurrent chemoradiation status post 2 cycles. We will continue as a short course of this treatment and the patient will proceed with cycle #3 today. I had a lengthy discussion with the patient and his grandson today about his current condition and treatment options especially with the evidence of metastatic disease in the left axillary lymph nodes. I discussed with him his treatment options and goals of care including palliative care and hospice referral versus consideration of systemic chemotherapy with carboplatin for AUC of 5 and paclitaxel 175 MG/M2 every 3 weeks. I discussed with the patient adverse effect of this treatment including but not limited to alopecia, myelosuppression, nausea and vomiting, peripheral neuropathy, liver or renal dysfunction. He is expected to start the first cycle of this treatment in 2 weeks. For COPD, I gave the patient another Medrol Dosepak. He will also continue his treatment with the albuterol inhaler. I will see him back for follow-up visit in 2 weeks for evaluation and management of any adverse effect of his treatment. He was advised to call immediately if he has any concerning symptoms in the interval. The patient voices understanding of current disease status and treatment options and is in agreement with the current care plan. All questions were answered. The patient knows to call the clinic with any problems, questions or concerns. We can certainly see the patient much sooner if necessary.  Disclaimer: This note was dictated with voice recognition software. Similar sounding words can inadvertently be transcribed and may not be corrected upon review.

## 2016-04-26 NOTE — Patient Instructions (Signed)
Steps to Quit Smoking Smoking tobacco can be bad for your health. It can also affect almost every organ in your body. Smoking puts you and people around you at risk for many serious long-lasting (chronic) diseases. Quitting smoking is hard, but it is one of the best things that you can do for your health. It is never too late to quit. What are the benefits of quitting smoking? When you quit smoking, you lower your risk for getting serious diseases and conditions. They can include:  Lung cancer or lung disease.  Heart disease.  Stroke.  Heart attack.  Not being able to have children (infertility).  Weak bones (osteoporosis) and broken bones (fractures). If you have coughing, wheezing, and shortness of breath, those symptoms may get better when you quit. You may also get sick less often. If you are pregnant, quitting smoking can help to lower your chances of having a baby of low birth weight. What can I do to help me quit smoking? Talk with your doctor about what can help you quit smoking. Some things you can do (strategies) include:  Quitting smoking totally, instead of slowly cutting back how much you smoke over a period of time.  Going to in-person counseling. You are more likely to quit if you go to many counseling sessions.  Using resources and support systems, such as:  Online chats with a counselor.  Phone quitlines.  Printed self-help materials.  Support groups or group counseling.  Text messaging programs.  Mobile phone apps or applications.  Taking medicines. Some of these medicines may have nicotine in them. If you are pregnant or breastfeeding, do not take any medicines to quit smoking unless your doctor says it is okay. Talk with your doctor about counseling or other things that can help you. Talk with your doctor about using more than one strategy at the same time, such as taking medicines while you are also going to in-person counseling. This can help make quitting  easier. What things can I do to make it easier to quit? Quitting smoking might feel very hard at first, but there is a lot that you can do to make it easier. Take these steps:  Talk to your family and friends. Ask them to support and encourage you.  Call phone quitlines, reach out to support groups, or work with a counselor.  Ask people who smoke to not smoke around you.  Avoid places that make you want (trigger) to smoke, such as:  Bars.  Parties.  Smoke-break areas at work.  Spend time with people who do not smoke.  Lower the stress in your life. Stress can make you want to smoke. Try these things to help your stress:  Getting regular exercise.  Deep-breathing exercises.  Yoga.  Meditating.  Doing a body scan. To do this, close your eyes, focus on one area of your body at a time from head to toe, and notice which parts of your body are tense. Try to relax the muscles in those areas.  Download or buy apps on your mobile phone or tablet that can help you stick to your quit plan. There are many free apps, such as QuitGuide from the CDC (Centers for Disease Control and Prevention). You can find more support from smokefree.gov and other websites. This information is not intended to replace advice given to you by your health care provider. Make sure you discuss any questions you have with your health care provider. Document Released: 10/17/2008 Document Revised: 08/19/2015 Document   Reviewed: 05/07/2014 Elsevier Interactive Patient Education  2017 Elsevier Inc.  

## 2016-04-27 ENCOUNTER — Ambulatory Visit
Admission: RE | Admit: 2016-04-27 | Discharge: 2016-04-27 | Disposition: A | Discharge status: Home / Self Care | Payer: Medicare Other | Source: Ambulatory Visit | Attending: Radiation Oncology | Admitting: Radiation Oncology

## 2016-04-27 DIAGNOSIS — Z510 Encounter for antineoplastic radiation therapy: Secondary | ICD-10-CM | POA: Diagnosis not present

## 2016-04-28 ENCOUNTER — Telehealth: Payer: Self-pay | Admitting: Oncology

## 2016-04-28 ENCOUNTER — Ambulatory Visit
Admission: RE | Admit: 2016-04-28 | Discharge: 2016-04-28 | Disposition: A | Discharge status: Home / Self Care | Payer: Medicare Other | Source: Ambulatory Visit | Attending: Radiation Oncology | Admitting: Radiation Oncology

## 2016-04-28 DIAGNOSIS — Z510 Encounter for antineoplastic radiation therapy: Secondary | ICD-10-CM | POA: Diagnosis not present

## 2016-04-28 MED ORDER — SUCRALFATE 1 GM/10ML PO SUSP: 1.0000 g | Freq: Three times a day (TID) | ORAL | 1 refills | Status: AC

## 2016-04-28 NOTE — Progress Notes (Signed)
Patient stopped by the clinic and said he feels like he has heartburn and that food feels stuck in his throat.  Advised him that Dr. Sondra Come will be contacted about a Carafate prescription and that we will call when it is sent in.

## 2016-04-28 NOTE — Telephone Encounter (Signed)
Called in prescription for Carafate to [facility] in [facility] per Dr. Sondra Come.  Left a message for patient that the prescription has been called in.

## 2016-04-29 ENCOUNTER — Ambulatory Visit
Admission: RE | Admit: 2016-04-29 | Discharge: 2016-04-29 | Disposition: A | Discharge status: Home / Self Care | Payer: Medicare Other | Source: Ambulatory Visit | Attending: Radiation Oncology | Admitting: Radiation Oncology

## 2016-04-29 ENCOUNTER — Telehealth: Payer: Self-pay | Admitting: Internal Medicine

## 2016-04-29 DIAGNOSIS — Z510 Encounter for antineoplastic radiation therapy: Secondary | ICD-10-CM | POA: Diagnosis not present

## 2016-04-29 NOTE — Telephone Encounter (Signed)
Called patient to pick up new updated appt per 4/23 los - patient already left to come to radiation appt - left message to pick up new schedule and put message in comments of radiation appt to pick up new schedule.

## 2016-04-30 ENCOUNTER — Ambulatory Visit
Admission: RE | Admit: 2016-04-30 | Discharge: 2016-04-30 | Disposition: A | Discharge status: Home / Self Care | Payer: Medicare Other | Source: Ambulatory Visit | Attending: Radiation Oncology | Admitting: Radiation Oncology

## 2016-04-30 DIAGNOSIS — Z510 Encounter for antineoplastic radiation therapy: Secondary | ICD-10-CM | POA: Diagnosis not present

## 2016-05-03 ENCOUNTER — Ambulatory Visit
Admission: RE | Admit: 2016-05-03 | Discharge: 2016-05-03 | Disposition: A | Discharge status: Home / Self Care | Payer: Medicare Other | Source: Ambulatory Visit | Attending: Radiation Oncology | Admitting: Radiation Oncology

## 2016-05-03 DIAGNOSIS — Z510 Encounter for antineoplastic radiation therapy: Secondary | ICD-10-CM | POA: Diagnosis not present

## 2016-05-04 ENCOUNTER — Ambulatory Visit
Admission: RE | Admit: 2016-05-04 | Discharge: 2016-05-04 | Disposition: A | Discharge status: Home / Self Care | Payer: Medicare Other | Source: Ambulatory Visit | Attending: Radiation Oncology | Admitting: Radiation Oncology

## 2016-05-04 ENCOUNTER — Other Ambulatory Visit: Payer: Self-pay | Admitting: Radiation Oncology

## 2016-05-04 DIAGNOSIS — C3492 Malignant neoplasm of unspecified part of left bronchus or lung: Secondary | ICD-10-CM

## 2016-05-04 DIAGNOSIS — Z510 Encounter for antineoplastic radiation therapy: Secondary | ICD-10-CM | POA: Diagnosis not present

## 2016-05-04 MED ORDER — OXYCODONE-ACETAMINOPHEN 5-325 MG PO TABS: 1.0000 | ORAL_TABLET | ORAL | 0 refills | Status: AC | PRN

## 2016-05-05 ENCOUNTER — Ambulatory Visit: Payer: Medicare Other

## 2016-05-06 ENCOUNTER — Ambulatory Visit: Payer: Medicare Other

## 2016-05-06 ENCOUNTER — Encounter: Payer: Self-pay | Admitting: Radiation Oncology

## 2016-05-06 NOTE — Progress Notes (Signed)
  Radiation Oncology         [phone] ________________________________  Name: Travis [name] MRN: [id number]  Date: 05/06/2016  DOB: Nov 21, 1940  End of Treatment Note  Diagnosis: Recurrent non-small cell lung cancer of the LUL with associated mediastinal lymphadenoapthy     Indication for treatment: palliative, left axillary lymph node positive for malignancy       Radiation treatment dates: 04/14/16-05/04/16  Site/dose:  Left lung/ 30 Gy in 15 fractions  Beams/energy: 3D/ 6X, 10X  Narrative: The patient tolerated radiation treatment relatively well. During treatment, the patient complained of pain with swallowing and feeling like food gets stuck in his throat. He also experienced a 10 pound weight loss in his last week of treatment. He reported slight improvement to his coughing fits. Skin to the back and chest was erythematous.   Plan: The patient has completed radiation treatment. The patient will return to radiation oncology clinic for routine followup in one month. I advised them to call or return sooner if they have any questions or concerns related to their recovery or treatment.  -----------------------------------  Blair Promise, PhD, MD  This document serves as a record of services personally performed by Gery Pray, MD. It was created on his behalf by Bethann Humble, a trained medical scribe. The creation of this record is based on the scribe's personal observations and the provider's statements to them. This document has been checked and approved by the attending provider.

## 2016-05-07 ENCOUNTER — Telehealth: Payer: Self-pay | Admitting: Oncology

## 2016-05-07 ENCOUNTER — Telehealth: Payer: Self-pay

## 2016-05-07 ENCOUNTER — Ambulatory Visit
Admission: RE | Admit: 2016-05-07 | Discharge: 2016-05-07 | Disposition: A | Discharge status: Home / Self Care | Payer: Medicare Other | Source: Ambulatory Visit | Attending: Radiation Oncology | Admitting: Radiation Oncology

## 2016-05-07 ENCOUNTER — Ambulatory Visit: Payer: Medicare Other

## 2016-05-07 VITALS — BP 150/86 | HR 70 | Temp 98.5°F | Ht 71.0 in | Wt 190.0 lb

## 2016-05-07 DIAGNOSIS — C3492 Malignant neoplasm of unspecified part of left bronchus or lung: Secondary | ICD-10-CM

## 2016-05-07 DIAGNOSIS — Z510 Encounter for antineoplastic radiation therapy: Secondary | ICD-10-CM | POA: Diagnosis not present

## 2016-05-07 MED ORDER — MAGIC MOUTHWASH W/LIDOCAINE: 5.0000 mL | ORAL | 1 refills | Status: DC | PRN

## 2016-05-07 MED ORDER — SODIUM CHLORIDE 0.9 % IV SOLN
INTRAVENOUS | Status: AC
  Administered 2016-05-07: 13:00:00 via INTRAVENOUS
  Filled 2016-05-07 (×2): qty 1000

## 2016-05-07 NOTE — Telephone Encounter (Signed)
Wife called twice stating pt finished XRT on Tues and his throat is so sore he cannot eat, is not taking his meds, can barely drink water. She is asking for possible fluids and help with the pain.

## 2016-05-07 NOTE — Progress Notes (Signed)
Patient completed IV infusion of 1000 mL normal saline.  Removed right wrist IV intact.  Pressure and band aid applied.  Patient discharged from the clinic with his family.

## 2016-05-07 NOTE — Telephone Encounter (Signed)
Called [place] back.  She said patient has been having more trouble/pain with swallowing since Wednesday and thinks he needs fluids.  She said he has not been able to swallow his pills since Wednesday because they get stuck in his chest and come back up.  She is concerned because he has not been able to take his cardiac medications for 2 days.  Dr. Lisbeth Renshaw advised of patient's condition and would like patient to come in today for follow up.  Called Sonia Baller back and they can be here at 11:15.  Follow up appointment scheduled.

## 2016-05-07 NOTE — Progress Notes (Signed)
Travis [name] is here for follow up.  He reports having severe pain with swallowing.  He said about 3 seconds after eating anything, he has severe, unbearable pain.  He has not been able to swallow his medications since Wednesday as they get stuck and won't go down.  He is supposed to be taking xarelto, plavix and multaq for cardiac issues.  He has tried percocet yesterday and carafate without any relief.  He has been able to drink 1/2 a bottle of gatorade today. Orthostatic vitals taken: bp sitting 136/78, hr 79, bp standing 126/71, hr 80.  He has lost about 10 lbs since 04/15/16.  He reports his breathing is "OK" today.  He does have a cough with clear sputum.  He has dermatitis on his left chest and is using biafine cream.  BP 126/71 (BP Location: Left Arm, Patient Position: Standing)   Pulse 80 Comment: standing  Temp 98.5 F (36.9 C) (Oral)   Ht '5\' 11"'$  (1.803 m)   Wt 190 lb (86.2 kg)   SpO2 98%   BMI 26.50 kg/m    Wt Readings from Last 3 Encounters:  05/07/16 190 lb (86.2 kg)  04/26/16 205 lb 9.6 oz (93.3 kg)  04/07/16 197 lb 8 oz (89.6 kg)

## 2016-05-07 NOTE — Progress Notes (Signed)
The patient came in today due to significant esophagitis. He has been taking Percocet and Carafate without enough relief. This has caused a significant decrease in his by mouth intake. I have given him a prescription for Magic mouthwash with lidocaine and encouraged him to continue taking the above medications as well. He also will be given IV fluids today. He is scheduled to see medical oncology on Monday as well. The patient does know that he would need to contact the cancer center or go to the emergency room if he starts to have substantial symptoms associated with continued poor intake. I am very hopeful that we will be able to avoid this and I believe that his discomfort should begin to improve in the near future given that he finished his radiation treatment earlier this week. Unfortunately, the patient's target area for radiation was near the esophagus and this potential side effect was unavoidable.

## 2016-05-10 ENCOUNTER — Emergency Department (HOSPITAL_COMMUNITY): Payer: Medicare Other

## 2016-05-10 ENCOUNTER — Other Ambulatory Visit: Payer: Medicare Other

## 2016-05-10 ENCOUNTER — Ambulatory Visit (HOSPITAL_BASED_OUTPATIENT_CLINIC_OR_DEPARTMENT_OTHER): Payer: Medicare Other | Admitting: Nurse Practitioner

## 2016-05-10 ENCOUNTER — Other Ambulatory Visit: Payer: Self-pay

## 2016-05-10 ENCOUNTER — Ambulatory Visit: Payer: Medicare Other

## 2016-05-10 ENCOUNTER — Encounter (HOSPITAL_COMMUNITY): Payer: Self-pay | Admitting: Emergency Medicine

## 2016-05-10 ENCOUNTER — Inpatient Hospital Stay (HOSPITAL_COMMUNITY)
Admission: EM | Admit: 2016-05-10 | Discharge: 2016-05-11 | DRG: 309 | Disposition: A | Discharge status: Home / Self Care | Payer: Medicare Other | Attending: Cardiology | Admitting: Cardiology

## 2016-05-10 ENCOUNTER — Other Ambulatory Visit (HOSPITAL_BASED_OUTPATIENT_CLINIC_OR_DEPARTMENT_OTHER): Payer: Medicare Other

## 2016-05-10 VITALS — BP 80/60 | HR 88 | Temp 97.8°F | Resp 19 | Ht 71.0 in | Wt 191.0 lb

## 2016-05-10 DIAGNOSIS — D649 Anemia, unspecified: Secondary | ICD-10-CM | POA: Diagnosis present

## 2016-05-10 DIAGNOSIS — K208 Other esophagitis: Secondary | ICD-10-CM

## 2016-05-10 DIAGNOSIS — I10 Essential (primary) hypertension: Secondary | ICD-10-CM | POA: Diagnosis not present

## 2016-05-10 DIAGNOSIS — C773 Secondary and unspecified malignant neoplasm of axilla and upper limb lymph nodes: Secondary | ICD-10-CM | POA: Diagnosis not present

## 2016-05-10 DIAGNOSIS — G4733 Obstructive sleep apnea (adult) (pediatric): Secondary | ICD-10-CM | POA: Diagnosis not present

## 2016-05-10 DIAGNOSIS — Z9861 Coronary angioplasty status: Secondary | ICD-10-CM

## 2016-05-10 DIAGNOSIS — T45516A Underdosing of anticoagulants, initial encounter: Secondary | ICD-10-CM | POA: Diagnosis present

## 2016-05-10 DIAGNOSIS — R9431 Abnormal electrocardiogram [ECG] [EKG]: Secondary | ICD-10-CM | POA: Diagnosis present

## 2016-05-10 DIAGNOSIS — Z79899 Other long term (current) drug therapy: Secondary | ICD-10-CM

## 2016-05-10 DIAGNOSIS — T462X6A Underdosing of other antidysrhythmic drugs, initial encounter: Secondary | ICD-10-CM | POA: Diagnosis present

## 2016-05-10 DIAGNOSIS — Z91128 Patient's intentional underdosing of medication regimen for other reason: Secondary | ICD-10-CM

## 2016-05-10 DIAGNOSIS — Z7901 Long term (current) use of anticoagulants: Secondary | ICD-10-CM

## 2016-05-10 DIAGNOSIS — Z902 Acquired absence of lung [part of]: Secondary | ICD-10-CM

## 2016-05-10 DIAGNOSIS — F17210 Nicotine dependence, cigarettes, uncomplicated: Secondary | ICD-10-CM | POA: Diagnosis not present

## 2016-05-10 DIAGNOSIS — C3492 Malignant neoplasm of unspecified part of left bronchus or lung: Secondary | ICD-10-CM | POA: Diagnosis not present

## 2016-05-10 DIAGNOSIS — Z5111 Encounter for antineoplastic chemotherapy: Secondary | ICD-10-CM

## 2016-05-10 DIAGNOSIS — I4891 Unspecified atrial fibrillation: Secondary | ICD-10-CM | POA: Diagnosis present

## 2016-05-10 DIAGNOSIS — I480 Paroxysmal atrial fibrillation: Secondary | ICD-10-CM | POA: Diagnosis not present

## 2016-05-10 DIAGNOSIS — C3490 Malignant neoplasm of unspecified part of unspecified bronchus or lung: Secondary | ICD-10-CM | POA: Diagnosis present

## 2016-05-10 DIAGNOSIS — E860 Dehydration: Secondary | ICD-10-CM

## 2016-05-10 DIAGNOSIS — Z923 Personal history of irradiation: Secondary | ICD-10-CM

## 2016-05-10 DIAGNOSIS — Z955 Presence of coronary angioplasty implant and graft: Secondary | ICD-10-CM

## 2016-05-10 DIAGNOSIS — Z7902 Long term (current) use of antithrombotics/antiplatelets: Secondary | ICD-10-CM | POA: Diagnosis not present

## 2016-05-10 DIAGNOSIS — E785 Hyperlipidemia, unspecified: Secondary | ICD-10-CM | POA: Diagnosis present

## 2016-05-10 DIAGNOSIS — Z9221 Personal history of antineoplastic chemotherapy: Secondary | ICD-10-CM | POA: Diagnosis not present

## 2016-05-10 DIAGNOSIS — C3412 Malignant neoplasm of upper lobe, left bronchus or lung: Secondary | ICD-10-CM

## 2016-05-10 DIAGNOSIS — E119 Type 2 diabetes mellitus without complications: Secondary | ICD-10-CM | POA: Diagnosis not present

## 2016-05-10 DIAGNOSIS — I2510 Atherosclerotic heart disease of native coronary artery without angina pectoris: Secondary | ICD-10-CM | POA: Diagnosis present

## 2016-05-10 DIAGNOSIS — I714 Abdominal aortic aneurysm, without rupture, unspecified: Secondary | ICD-10-CM | POA: Diagnosis present

## 2016-05-10 DIAGNOSIS — J449 Chronic obstructive pulmonary disease, unspecified: Secondary | ICD-10-CM | POA: Diagnosis not present

## 2016-05-10 DIAGNOSIS — R001 Bradycardia, unspecified: Secondary | ICD-10-CM | POA: Diagnosis present

## 2016-05-10 DIAGNOSIS — Z888 Allergy status to other drugs, medicaments and biological substances status: Secondary | ICD-10-CM

## 2016-05-10 DIAGNOSIS — Z8249 Family history of ischemic heart disease and other diseases of the circulatory system: Secondary | ICD-10-CM

## 2016-05-10 DIAGNOSIS — R1310 Dysphagia, unspecified: Secondary | ICD-10-CM | POA: Diagnosis present

## 2016-05-10 HISTORY — DX: Abdominal aortic aneurysm, without rupture: I71.4

## 2016-05-10 HISTORY — DX: Abdominal aortic aneurysm, without rupture, unspecified: I71.40

## 2016-05-10 LAB — MAGNESIUM: MAGNESIUM: 1.9 mg/dL (ref 1.7–2.4)

## 2016-05-10 LAB — COMPREHENSIVE METABOLIC PANEL
ALK PHOS: 81 U/L (ref 40–150)
ALT: 36 U/L (ref 0–55)
ANION GAP: 12 meq/L — AB (ref 3–11)
AST: 24 U/L (ref 5–34)
Albumin: 3.3 g/dL — ABNORMAL LOW (ref 3.5–5.0)
BUN: 11.2 mg/dL (ref 7.0–26.0)
CALCIUM: 9.2 mg/dL (ref 8.4–10.4)
CO2: 24 mEq/L (ref 22–29)
CREATININE: 1.2 mg/dL (ref 0.7–1.3)
Chloride: 108 mEq/L (ref 98–109)
EGFR: 57 mL/min/{1.73_m2} — ABNORMAL LOW (ref >90)
Glucose: 132 mg/dl (ref 70–140)
POTASSIUM: 3.5 meq/L (ref 3.5–5.1)
Sodium: 143 mEq/L (ref 136–145)
Total Bilirubin: 0.92 mg/dL (ref 0.20–1.20)
Total Protein: 6.2 g/dL — ABNORMAL LOW (ref 6.4–8.3)

## 2016-05-10 LAB — CBC WITH DIFFERENTIAL/PLATELET
BASO%: 0.8 % (ref 0.0–2.0)
BASOS ABS: 0 10*3/uL (ref 0.0–0.1)
EOS ABS: 0 10*3/uL (ref 0.0–0.5)
EOS%: 0.3 % (ref 0.0–7.0)
HEMATOCRIT: 42.4 % (ref 38.4–49.9)
HGB: 13.7 g/dL (ref 13.0–17.1)
LYMPH#: 0.6 10*3/uL — AB (ref 0.9–3.3)
LYMPH%: 11.3 % — ABNORMAL LOW (ref 14.0–49.0)
MCH: 26.4 pg — AB (ref 27.2–33.4)
MCHC: 32.4 g/dL (ref 32.0–36.0)
MCV: 81.4 fL (ref 79.3–98.0)
MONO#: 1 10*3/uL — AB (ref 0.1–0.9)
MONO%: 20.4 % — ABNORMAL HIGH (ref 0.0–14.0)
NEUT#: 3.3 10*3/uL (ref 1.5–6.5)
NEUT%: 67.2 % (ref 39.0–75.0)
PLATELETS: 135 10*3/uL — AB (ref 140–400)
RBC: 5.21 10*6/uL (ref 4.20–5.82)
RDW: 19 % — ABNORMAL HIGH (ref 11.0–14.6)
WBC: 5 10*3/uL (ref 4.0–10.3)

## 2016-05-10 LAB — APTT: aPTT: 33 seconds (ref 24–36)

## 2016-05-10 LAB — HEPARIN LEVEL (UNFRACTIONATED): Heparin Unfractionated: >2.2 IU/mL — ABNORMAL HIGH (ref 0.30–0.70)

## 2016-05-10 LAB — TROPONIN I: Troponin I: 0.04 ng/mL (ref <0.03)

## 2016-05-10 LAB — TECHNOLOGIST REVIEW

## 2016-05-10 MED ORDER — AMIODARONE HCL IN DEXTROSE 360-4.14 MG/200ML-% IV SOLN
60.0000 mg/h | INTRAVENOUS | Status: AC
  Administered 2016-05-10: 60 mg/h via INTRAVENOUS
  Filled 2016-05-10: qty 200

## 2016-05-10 MED ORDER — ALBUTEROL SULFATE (2.5 MG/3ML) 0.083% IN NEBU: 3.0000 mL | INHALATION_SOLUTION | RESPIRATORY_TRACT | Status: DC | PRN

## 2016-05-10 MED ORDER — ONDANSETRON HCL 4 MG/2ML IJ SOLN: 4.0000 mg | Freq: Four times a day (QID) | INTRAMUSCULAR | Status: DC | PRN

## 2016-05-10 MED ORDER — CLOPIDOGREL BISULFATE 75 MG PO TABS
75.0000 mg | ORAL_TABLET | Freq: Every day | ORAL | Status: DC
  Administered 2016-05-11: 75 mg via ORAL
  Filled 2016-05-10: qty 1

## 2016-05-10 MED ORDER — SUCRALFATE 1 GM/10ML PO SUSP
1.0000 g | Freq: Three times a day (TID) | ORAL | Status: DC
  Administered 2016-05-11: 1 g via ORAL
  Filled 2016-05-10 (×2): qty 10

## 2016-05-10 MED ORDER — FERROUS SULFATE 325 (65 FE) MG PO TABS
325.0000 mg | ORAL_TABLET | Freq: Every day | ORAL | Status: DC
  Administered 2016-05-10: 325 mg via ORAL
  Filled 2016-05-10: qty 1

## 2016-05-10 MED ORDER — ORAL CARE MOUTH RINSE
15.0000 mL | Freq: Two times a day (BID) | OROMUCOSAL | Status: DC
  Administered 2016-05-10: 15 mL via OROMUCOSAL

## 2016-05-10 MED ORDER — OXYCODONE-ACETAMINOPHEN 5-325 MG PO TABS: 1.0000 | ORAL_TABLET | ORAL | Status: DC | PRN

## 2016-05-10 MED ORDER — PANTOPRAZOLE SODIUM 40 MG PO TBEC
40.0000 mg | DELAYED_RELEASE_TABLET | Freq: Every day | ORAL | Status: DC
  Administered 2016-05-11: 40 mg via ORAL
  Filled 2016-05-10: qty 1

## 2016-05-10 MED ORDER — ROSUVASTATIN CALCIUM 10 MG PO TABS: 10.0000 mg | ORAL_TABLET | Freq: Every day | ORAL | Status: DC

## 2016-05-10 MED ORDER — AMIODARONE HCL IN DEXTROSE 360-4.14 MG/200ML-% IV SOLN
30.0000 mg/h | INTRAVENOUS | Status: DC
  Administered 2016-05-11: 30 mg/h via INTRAVENOUS
  Filled 2016-05-10: qty 200

## 2016-05-10 MED ORDER — LACTATED RINGERS IV BOLUS (SEPSIS)
1000.0000 mL | Freq: Once | INTRAVENOUS | Status: AC
Start: 2016-05-10 — End: 2016-05-10
  Administered 2016-05-10: 1000 mL via INTRAVENOUS

## 2016-05-10 MED ORDER — ISOSORBIDE MONONITRATE ER 30 MG PO TB24
30.0000 mg | ORAL_TABLET | Freq: Every day | ORAL | Status: DC
Start: 2016-05-10 — End: 2016-05-11
  Administered 2016-05-10: 30 mg via ORAL
  Filled 2016-05-10: qty 1

## 2016-05-10 MED ORDER — HEPARIN (PORCINE) IN NACL 100-0.45 UNIT/ML-% IJ SOLN
1250.0000 [IU]/h | INTRAMUSCULAR | Status: DC
  Administered 2016-05-10: 1250 [IU]/h via INTRAVENOUS
  Filled 2016-05-10 (×2): qty 250

## 2016-05-10 MED ORDER — DILTIAZEM LOAD VIA INFUSION
20.0000 mg | Freq: Once | INTRAVENOUS | Status: AC
  Administered 2016-05-10: 20 mg via INTRAVENOUS
  Filled 2016-05-10: qty 20

## 2016-05-10 MED ORDER — TRAZODONE HCL 50 MG PO TABS
200.0000 mg | ORAL_TABLET | Freq: Every day | ORAL | Status: DC
  Administered 2016-05-10: 200 mg via ORAL
  Filled 2016-05-10: qty 1

## 2016-05-10 MED ORDER — FAMOTIDINE 20 MG PO TABS: 20.0000 mg | ORAL_TABLET | Freq: Every day | ORAL | Status: DC

## 2016-05-10 MED ORDER — AMIODARONE LOAD VIA INFUSION
150.0000 mg | Freq: Once | INTRAVENOUS | Status: AC
  Administered 2016-05-10: 150 mg via INTRAVENOUS
  Filled 2016-05-10: qty 83.34

## 2016-05-10 MED ORDER — DILTIAZEM HCL-DEXTROSE 100-5 MG/100ML-% IV SOLN (PREMIX)
5.0000 mg/h | INTRAVENOUS | Status: DC
  Administered 2016-05-10: 5 mg/h via INTRAVENOUS
  Filled 2016-05-10: qty 100

## 2016-05-10 MED ORDER — DILTIAZEM HCL 30 MG PO TABS
30.0000 mg | ORAL_TABLET | Freq: Once | ORAL | Status: DC
  Filled 2016-05-10: qty 1

## 2016-05-10 MED ORDER — ACETAMINOPHEN 325 MG PO TABS: 650.0000 mg | ORAL_TABLET | ORAL | Status: DC | PRN

## 2016-05-10 NOTE — ED Provider Notes (Signed)
[facility] DEPT Provider Note   CSN: [id number] Arrival date & time: 05/10/16  1444     History   Chief Complaint Chief Complaint  Patient presents with  . Abnormal ECG    HPI Travis [name] is a 76 y.o. male.  HPI Pt comes in with cc of abnormal HR. Pt has hx of lung CA on chemo, COPD, CAD, DM, AF on xarelto. Pt reports that he has had some weakness and anorexia the last few days. He has missed 4-5 doses of xarelto. Pt went to cancer center for his chemo, and they noted that he is having tachycardia. Pt's HR is in the 150s. He was noted to have a low BP, so he was asked to come to the ER. Pt is having some chest discomfort, and it has been constant for a week. Pt has no dib, recent infection.   Past Medical History:  Diagnosis Date  . AAA family hx 11/20/2014  . Anemia   . CAD (coronary artery disease)    s/p RCA atherectomy 1994, cath 1999 with occluded RCA with left to right collaterals, PCI of LAD 2007, s/p PCI of LAD for instent thrombosis 2008, PCI 08/2015 with DES to LAD due to ISR with known L-R collaterals to RCA  . COPD (chronic obstructive pulmonary disease) ([place])   . Diabetes mellitus without complication (HCC)    diet controlled   . Dyslipidemia   . Dyspnea   . Dysrhythmia   . Encounter for antineoplastic chemotherapy 03/18/2016  . Goals of care, counseling/discussion 03/18/2016  . Hyperlipidemia   . Hypertension   . Insomnia   . Lung cancer [facility])    s/p lobectomy, chemo and radiation therapy  . OSA (obstructive sleep apnea)    intolerant to CPAP  . PAF (paroxysmal atrial fibrillation) ([place])   . Pneumonia   . Psoriasis   . Squamous cell carcinoma of lung, stage III, left ([place]) 03/18/2016  . Tobacco abuse     Patient Active Problem List   Diagnosis Date Noted  . Atrial fibrillation with RVR ([place]) 05/10/2016  . Fall 04/19/2016  . Squamous cell carcinoma of lung, stage III, left ([place]) 03/18/2016  . Goals of care, counseling/discussion 03/18/2016  .  Encounter for antineoplastic chemotherapy 03/18/2016  . Mass of left lung 03/04/2016  . Acute pulmonary embolism ([place]) 03/02/2016  . Cough 02/26/2016  . AAA (abdominal aortic aneurysm) ([place]) 11/04/2015  . Iron deficiency anemia 10/25/2015  . Shortness of breath 10/17/2015  . Chronic anticoagulation-Xarelto 08/23/2015  . Angina, class III ([place]) 08/22/2015  . Abnormal nuclear stress test 08/13/15  08/22/2015  . Morbid obesity ([place]) 11/21/2014  . AAA family hx 11/20/2014  . Absent pulse in lower extremity 11/20/2014  . Encounter for smoking cessation counseling 11/20/2014  . CAP (community acquired pneumonia) 05/14/2013  . CAD S/P multiple PCIs   . COPD GOLD II  09/16/2010  . Cigarette smoker 09/16/2010  . PAF (paroxysmal atrial fibrillation) ([place])   . Essential hypertension   . Dyslipidemia   . OSA (obstructive sleep apnea)   . Lung cancer [facility])     Past Surgical History:  Procedure Laterality Date  . APPENDECTOMY    . CARDIAC CATHETERIZATION  1999  . CARDIAC CATHETERIZATION N/A 08/22/2015   Procedure: Left Heart Cath and Coronary Angiography;  Surgeon: Leonie Man, MD;  Location: [facility] CV [facility];  Service: Cardiovascular;  Laterality: N/A;  . CARDIAC CATHETERIZATION N/A 08/22/2015   Procedure: Coronary Stent Intervention;  Surgeon: Shanon Brow  Loren Racer, MD;  Location: [facility];  Service: Cardiovascular;  Laterality: N/A;  . EXPLORATORY LAPAROTOMY     secondary to trauma  . multiple PCT's to RCA and LAD  2007/2008  . s/p lung lobectomy  2007  . VIDEO BRONCHOSCOPY N/A 08/28/2012   Procedure: VIDEO BRONCHOSCOPY WITHOUT FLUORO;  Surgeon: Elsie Stain, MD;  Location: [place] [place];  Service: Cardiopulmonary;  Laterality: N/A;  . VIDEO BRONCHOSCOPY Bilateral 03/11/2016   Procedure: VIDEO BRONCHOSCOPY WITHOUT FLUORO;  Surgeon: Tanda Rockers, MD;  Location: [facility];  Service: Cardiopulmonary;  Laterality: Bilateral;       Home Medications    Prior to Admission  medications   Medication Sig Start Date End Date Taking? Authorizing Provider  albuterol (PROAIR HFA) 108 (90 Base) MCG/ACT inhaler Inhale 2 puffs into the lungs every 4 (four) hours as needed for wheezing or shortness of breath.    Yes [provider]  clopidogrel (PLAVIX) 75 MG tablet Take 1 tablet (75 mg total) by mouth daily with breakfast. 09/02/15  Yes Dunn, Dayna N, PA-C  ferrous sulfate 325 (65 FE) MG tablet Take 325 mg by mouth at bedtime.    Yes [provider]  isosorbide mononitrate (IMDUR) 30 MG 24 hr tablet Take 1 tablet (30 mg total) by mouth daily. Patient taking differently: Take 30 mg by mouth at bedtime.  10/14/15 05/10/16 Yes Bhagat, Bhavinkumar, PA  magic mouthwash w/lidocaine SOLN Take 5 mLs by mouth every 4 (four) hours as needed for mouth pain. 05/07/16  Yes Kyung Rudd, MD  nitroGLYCERIN (NITROSTAT) 0.4 MG SL tablet Place 0.4 mg under the tongue every 5 (five) minutes as needed for chest pain. X 3 doses   Yes [provider]  oxyCODONE-acetaminophen (PERCOCET/ROXICET) 5-325 MG tablet Take 1 tablet by mouth every 4 (four) hours as needed for severe pain. 05/04/16  Yes Gery Pray, MD  pantoprazole (PROTONIX) 40 MG tablet Take 1 tablet (40 mg total) by mouth daily. 09/02/15  Yes Dunn, Dayna N, PA-C  rosuvastatin (CRESTOR) 10 MG tablet Take 1 tablet (10 mg total) by mouth daily. Patient taking differently: Take 10 mg by mouth at bedtime.  10/21/15 05/10/16 Yes Simmons, Brittainy M, PA-C  sucralfate (CARAFATE) 1 GM/10ML suspension Take 10 mLs (1 g total) by mouth 4 (four) times daily -  with meals and at bedtime. 04/28/16  Yes Gery Pray, MD  Tiotropium Bromide-Olodaterol (STIOLTO RESPIMAT) 2.5-2.5 MCG/ACT AERS Inhale 2 puffs into the lungs daily. 02/25/16  Yes Tanda Rockers, MD  traZODone (DESYREL) 100 MG tablet Take 200 mg by mouth at bedtime.    Yes [provider]  XARELTO 20 MG TABS tablet Take 20 mg by mouth daily. 02/17/16  Yes [provider]  acetaminophen-codeine (TYLENOL #3) 300-30 MG tablet One every 4 hours as needed for cough Patient not taking: Reported on 04/05/2016 03/04/16   Tanda Rockers, MD  benzonatate (TESSALON) 100 MG capsule Take 1 capsule (100 mg total) by mouth 3 (three) times daily as needed for cough. Patient not taking: Reported on 04/26/2016 04/12/16   Curt Bears, MD  famotidine (PEPCID) 20 MG tablet One at bedtime Patient not taking: Reported on 04/15/2016 11/25/15   Tanda Rockers, MD  HYDROcodone-homatropine [facility]) 5-1.5 MG/5ML syrup Take 5 mLs by mouth every 6 (six) hours as needed for cough. Patient not taking: Reported on 05/07/2016 04/12/16   Curt Bears, MD  MULTAQ 400 MG tablet TAKE 1 TABLET BY MOUTH TWICE A DAY  30 Patient not taking: Reported on 05/10/2016 10/06/15   Sueanne Margarita, MD  ondansetron (ZOFRAN) 8 MG tablet Take 1 tablet (8 mg total) by mouth every 8 (eight) hours as needed for nausea or vomiting. Patient not taking: Reported on 05/07/2016 03/23/16   Curt Bears, MD  promethazine (PHENERGAN) 25 MG tablet Take 1 tablet (25 mg total) by mouth every 6 (six) hours as needed for nausea or vomiting. Patient not taking: Reported on 04/26/2016 03/29/16   Curt Bears, MD    Family History Family History  Problem Relation Age of Onset  . Heart attack Father   . Heart disease Mother   . Heart attack Mother   . Coronary artery disease Brother   . Lymphoma Brother   . Breast cancer Sister   . Diabetes Sister   . Heart disease Sister   . Colon cancer Neg Hx   . Stomach cancer Neg Hx     Social History Social History  Substance Use Topics  . Smoking status: Current Some Day Smoker    Packs/day: 0.25    Years: 58.00    Types: Cigarettes  . Smokeless tobacco: Current User    Types: Snuff  . Alcohol use No     Allergies   Gadolinium derivatives   Review of Systems Review of Systems  All other systems reviewed and are negative.    Physical Exam Updated  Vital Signs BP 105/65   Pulse 76   Temp 97.6 F (36.4 C) (Oral)   Resp 14   Ht '5\' 11"'$  (1.803 m)   Wt 191 lb (86.6 kg)   SpO2 98%   BMI 26.64 kg/m   Physical Exam  Constitutional: He is oriented to person, place, and time. He appears well-developed.  HENT:  Head: Normocephalic and atraumatic.  Eyes: Conjunctivae and EOM are normal. Pupils are equal, round, and reactive to light.  Neck: Normal range of motion. Neck supple.  Cardiovascular:  Tachycardia, irregular rhythm  Pulmonary/Chest: Effort normal and breath sounds normal.  Abdominal: Soft. Bowel sounds are normal. He exhibits no distension and no mass. There is no tenderness. There is no rebound and no guarding.  Musculoskeletal: He exhibits no deformity.  Neurological: He is alert and oriented to person, place, and time.  Skin: Skin is warm.  Nursing note and vitals reviewed.    ED Treatments / Results  Labs (all labs ordered are listed, but only abnormal results are displayed) Labs Reviewed  TROPONIN I - Abnormal; Notable for the following:       Result Value   Troponin I 0.04 (*)    All other components within normal limits  HEPARIN LEVEL (UNFRACTIONATED) - Abnormal; Notable for the following:    Heparin Unfractionated >2.20 (*)    All other components within normal limits  MAGNESIUM  APTT    EKG  EKG Interpretation None       Radiology Dg Chest Port 1 View  Result Date: 05/10/2016 CLINICAL DATA:  Hypotension in atrial fibrillation today. History of lung cancer. EXAM: PORTABLE CHEST 1 VIEW COMPARISON:  PET CT scan 03/12/2016. FINDINGS: Radiotherapy seeds in the left mid lung zone are identified as on the prior exam. No consolidative process, pneumothorax or effusion. Heart size is normal. No acute bony abnormality. IMPRESSION: No acute disease. Electronically Signed   By: Inge Rise M.D.   On: 05/10/2016 16:07    Procedures Procedures (including critical care time)  CRITICAL CARE Performed by:  Varney Biles   Total critical  care time: 55 minutes  Critical care time was exclusive of separately billable procedures and treating other patients.  Critical care was necessary to treat or prevent imminent or life-threatening deterioration.  Critical care was time spent personally by me on the following activities: development of treatment plan with patient and/or surrogate as well as nursing, discussions with consultants, evaluation of patient's response to treatment, examination of patient, obtaining history from patient or surrogate, ordering and performing treatments and interventions, ordering and review of laboratory studies, ordering and review of radiographic studies, pulse oximetry and re-evaluation of patient's condition.   Medications Ordered in ED Medications  diltiazem (CARDIZEM) 1 mg/mL load via infusion 20 mg (20 mg Intravenous Bolus from Bag 05/10/16 1614)    And  diltiazem (CARDIZEM) 100 mg in dextrose 5% 147m (1 mg/mL) infusion (0 mg/hr Intravenous Stopped 05/10/16 1625)  diltiazem (CARDIZEM) tablet 30 mg (0 mg Oral Hold 05/10/16 1620)  amiodarone (NEXTERONE) 1.8 mg/mL load via infusion 150 mg (150 mg Intravenous Bolus from Bag 05/10/16 1641)    Followed by  amiodarone (NEXTERONE PREMIX) 360-4.14 MG/200ML-% (1.8 mg/mL) IV infusion (60 mg/hr Intravenous New Bag/Given 05/10/16 1656)    Followed by  amiodarone (NEXTERONE PREMIX) 360-4.14 MG/200ML-% (1.8 mg/mL) IV infusion (not administered)  heparin ADULT infusion 100 units/mL (25000 units/2532msodium chloride 0.45%) (not administered)  lactated ringers bolus 1,000 mL (0 mLs Intravenous Stopped 05/10/16 1628)     Initial Impression / Assessment and Plan / ED Course  I have reviewed the triage vital signs and the nursing notes.  Pertinent labs & imaging results that were available during my care of the patient were reviewed by me and considered in my medical decision making (see chart for details).  Clinical Course as of May 11 1743  Mon May 10, 2016  1735 Reassessment post diltiazem bolus - no significant improvement in the HR. As per the recs from Cards, Amiodarone gtt started.  [AN]    Clinical Course User Index [AN] NaVarney BilesMD   Pt comes in with AF with RVR and low BP. Not sure what the underlying cause for the RVR is. From the acute side, it doesn't appear that there is sepsis or thyroid issues as the etiology. Pt has lung ca, and so we have considered PE in the ddx, but pt is usually compliant with his NOAC, so I think PE is less likely due to that.  We cannot cardiovert the patient due to the non compliance with xarelto. Spoke with Cardiology - they recommend amiodarone if dilt bolus doesn't work and admit to their service.  We will admit to Cards.   CHA2DS2/VAS Stroke Risk Points      4 >= 2 Points: High Risk  1 - 1.99 Points: Medium Risk  0 Points: Low Risk    The patient's score has not changed in the past year.:  No Change         Details    Note: External data might be a factor in metrics not marked with    Points Metrics   This score determines the patient's risk of having a stroke if the  patient has atrial fibrillation.       0 Has Congestive Heart Failure:  No   1 Has Vascular Disease:  Yes   1 Has Hypertension:  Yes   2 Age:  [age] 0 Has Diabetes:  No   0 Had Stroke:  No Had TIA:  No Had thromboembolism:  No  0 Male:  No     SCORE IS 4.     Final Clinical Impressions(s) / ED Diagnoses   Final diagnoses:  Atrial fibrillation with RVR [facility])    New Prescriptions New Prescriptions   No medications on file     Varney Biles, MD 05/10/16 1745

## 2016-05-10 NOTE — Progress Notes (Addendum)
[facility] OFFICE PROGRESS NOTE   DIAGNOSIS: Stage IV (T2b, N2, M1b) presented with left upper lobe lung nodule in addition to mediastinal lymphadenopathy as well as metastatic disease to the left axillary lymph nodes diagnosed in March 2018. PDL 1 is 10%  PRIOR THERAPY: None  CURRENT THERAPY: Short course of Concurrent chemoradiation with weekly carboplatin for AUC of 2 and paclitaxel 45 MG/M2. First dose 04/12/2016. Status post 3 cycles with last chemotherapy given 04/26/2016. Radiation completed 05/04/2016  INTERVAL HISTORY:   [facility]rav[facility] [name] returns as scheduled. He completed week 3 carboplatin/Taxol 04/26/2016. He completed the course of radiation 05/04/2016. He was seen by Dr. Lisbeth Renshaw 05/07/2016 due to radiation esophagitis. He received intravenous hydration. He felt no better following the IV fluids. He continues to have marked pain with swallowing. He takes Percocet as needed. He intermittently takes the Carafate. He is tolerating some soups and "sips" of liquid. His wife reports he is not taking the majority of his medications. He denies nausea/vomiting. No diarrhea. He has occasional lightheadedness/dizziness.  Objective:  Vital signs in last 24 hours:  Blood pressure (!) 80/60, pulse 88, temperature 97.8 F (36.6 C), temperature source Oral, resp. rate 19, height '5\' 11"'$  (1.803 m), weight 191 lb (86.6 kg), SpO2 (!) 83 %.    HEENT: No thrush or ulcers. Resp: Distant breath sounds with scattered wheezes. No respiratory distress. Cardio: Irregular. GI: Abdomen soft and nontender. No hepatomegaly. Vascular: No leg edema. Neuro: Alert and oriented.    Lab Results:  Lab Results  Component Value Date   WBC 5.0 05/10/2016   HGB 13.7 05/10/2016   HCT 42.4 05/10/2016   MCV 81.4 05/10/2016   PLT 135 (L) 05/10/2016   NEUTROABS 3.3 05/10/2016   EKG-rapid atrial fibrillation Imaging:  No results found.  Medications: I have reviewed the patient's current  medications.  Assessment/Plan: 1. Stage IV non-small cell lung cancer, squamous cell carcinoma presented with recurrent disease in the left upper lobe as well as mediastinal lymphadenopathy and metastatic disease in the left axillary lymph node. He completed radiation and 3 cycles of carboplatin/Taxol. Last chemotherapy was given 04/26/2016. He completed the course of radiation 05/04/2016. 2. Radiation esophagitis. 3. Dehydration secondary to #2. 4. Rapid atrial fibrillation.   Disposition: Travis [name] has completed the course of concurrent radiation/chemotherapy. He has significant radiation esophagitis and is having difficulty maintaining adequate oral hydration. He received IV fluids last week. He is hypotensive. EKG shows rapid atrial fibrillation. We are referring him to the [facility] emergency department for further evaluation. We are canceling today's chemotherapy and will reschedule to next week.  Patient seen with Dr. Julien Nordmann.    Ned Card ANP/GNP-BC   78/07/2016  2:07 PM  ADDENDUM: Hematology/Oncology Attending: I had a face to face encounter with the patient today. I recommended his care plan. This is a very pleasant 76 years old white male with a stage IV non-small cell lung cancer who completed a short course of palliative concurrent chemoradiation with weekly carboplatin and paclitaxel. The patient is here today to start the first cycle of systemic chemotherapy with carboplatin and paclitaxel every 3 weeks. He presented with increasing fatigue and weakness as well as hypotension and tachycardia. EKG in the clinic today showed atrial fibrillation with rapid ventricular response. We will try to call his cardiologist for evaluation of his condition but were unable to reach them. I recommended for the patient to go immediately to the emergency department at [facility] for evaluation and management  of his atrial fibrillation. I will delay the start of his systemic  chemotherapy until next week. He was advised to call immediately if he has any concerning symptoms in the interval.  Disclaimer: This note was dictated with voice recognition software. Similar sounding words can inadvertently be transcribed and may be missed upon review. Eilleen Kempf., MD 05/10/16

## 2016-05-10 NOTE — ED Notes (Signed)
D/c cardizem per EDP

## 2016-05-10 NOTE — ED Notes (Addendum)
Pharmacy called and states they will not order heparin until around 10pm tonight because patient last had blood thinner last night at 10pm. EDP made aware.

## 2016-05-10 NOTE — ED Notes (Signed)
Bed: WA09 Expected date:  Expected time:  Means of arrival:  Comments: Cancer center

## 2016-05-10 NOTE — Progress Notes (Signed)
ANTICOAGULATION CONSULT NOTE - Initial Consult  Pharmacy Consult for Heparin Indication: atrial fibrillation  Allergies  Allergen Reactions  . Gadolinium Derivatives Hives, Shortness Of Breath, Itching, Swelling and Cough    Pt had severe hives and whelps with swelling around eyes and facial swelling, sob and wheezing  immed post gad inj        Patient Measurements: Height: '5\' 11"'$  (180.3 cm) Weight: 191 lb (86.6 kg) IBW/kg (Calculated) : 75.3 Heparin Dosing Weight: total body weight  Vital Signs: Temp: 97.6 F (36.4 C) (05/07 1453) Temp Source: Oral (05/07 1453) BP: 105/65 (05/07 1730) Pulse Rate: 76 (05/07 1730)  Labs:  Recent Labs  05/10/16 1253 05/10/16 1517 05/10/16 1617  HGB 13.7  --   --   HCT 42.4  --   --   PLT 135*  --   --   APTT  --   --  33  CREATININE 1.2  --   --   TROPONINI  --  0.04*  --     Estimated Creatinine Clearance: 54.9 mL/min (by C-G formula based on SCr of 1.2 mg/dL).   Medical History: Past Medical History:  Diagnosis Date  . AAA family hx 11/20/2014  . Anemia   . CAD (coronary artery disease)    s/p RCA atherectomy 1994, cath 1999 with occluded RCA with left to right collaterals, PCI of LAD 2007, s/p PCI of LAD for instent thrombosis 2008, PCI 08/2015 with DES to LAD due to ISR with known L-R collaterals to RCA  . COPD (chronic obstructive pulmonary disease) ([place])   . Diabetes mellitus without complication (HCC)    diet controlled   . Dyslipidemia   . Dyspnea   . Dysrhythmia   . Encounter for antineoplastic chemotherapy 03/18/2016  . Goals of care, counseling/discussion 03/18/2016  . Hyperlipidemia   . Hypertension   . Insomnia   . Lung cancer [facility])    s/p lobectomy, chemo and radiation therapy  . OSA (obstructive sleep apnea)    intolerant to CPAP  . PAF (paroxysmal atrial fibrillation) ([place])   . Pneumonia   . Psoriasis   . Squamous cell carcinoma of lung, stage III, left ([place]) 03/18/2016  . Tobacco abuse      Assessment:  76 yr male with PMH significant for lung cancer and AFib.  PTA patient on Xarelto '20mg'$  daily with last dose reported as taken on 5/6 @ 22:00  Patient sent to ED from [facility] for hypotension and rapid AFib with RVR  Pharmacy consulted to dose IV Heparin for AFib  Due to Xarelto, will begin IV heparin 24 hr after last dose of Xarelto  Goal of Therapy:  Heparin level 0.3-0.7 units/ml aPTT 66-102 seconds Monitor platelets by anticoagulation protocol: Yes   Plan:   Obtain baseline aPTT and Heparin Level  At 22:00 will begin IV heparin infusion @ 1250 units/hr (no bolus)  Check aPTT and Heparin level 8 hr after heparin started  Follow CBC and heparin daily.  Will monitor IV heparin with aPTT until effects of Xarelto have cleared  Javona Bergevin, Toribio Harbour, PharmD 05/10/2016,5:36 PM

## 2016-05-10 NOTE — ED Notes (Signed)
ED Provider at bedside. 

## 2016-05-10 NOTE — ED Notes (Signed)
[facility] at bedside to transport patient to [facility]

## 2016-05-10 NOTE — Progress Notes (Signed)
Pt arrived via stretcher from [employer] Pt alert and oriented. On 2LNC. Amiodarone gtt stopped in route here due to HR drop back to pt baseline. Currently 60 bpm. VSS. CHG bath given and skin swarm completed with Steffanie Dunn, Therapist, sports. No areas of breakdown or concern noted. Non-invasive monitoring initiated. Pt currently in afib. POC discussed. MRSA swab collected. Pt oriented to room/unit. Bed low and locked with call bell in reach. Provider paged and made aware of pt arrival to unit. Admissions orders pending provider arrival to bedside.

## 2016-05-10 NOTE — ED Triage Notes (Signed)
Pt sent from cancer center due to hypotension and rapid a. Fib with RVR. Patient has hx of a fib. Patient is being treated for stage 4 lung cancer. Patient's oxygen on room air was 83%.

## 2016-05-10 NOTE — H&P (Signed)
Patient ID: Travis [name] MRN: [id number], DOB/AGE: 02/15/1940   Admit date: 05/10/2016   Primary Physician: Corine Shelter, PA-C Primary Cardiologist: Dr. Radford Pax Reason for admission: afib with RVR.  HPI:  Travis [name] is a 76 y.o. male with [name] (s/p PCI of the RCA 1994 with repeat cath 1999 with occluded RCA with left to right collaterals, PCI of LAD 2007, PCI of LAD 2008 for instent thrombosis, PCI 08/2015 with DES to LAD due to ISR with known L-R collaterals to RCA), OSA on CPAP, dyslipidemia, PAF on Xarelto and Multaq, HTN, COPD, lung CA Stage IV(T2b, N2, M1b), AAA, ongoingtobacco abuse who presented to [facility] on 05/10/16 with afib with RVR and was transferred to [facility] for further evaluation and treatment.  He was evaluated for fatigue/decreased exercise tolerance with abnl nuc which lead to [facility] 08/22/15 revealing 80% ISR of LAD tx with DES with known CTO of RCA with L-R collaterals, mildly elevated LVEDP, EF 50-55%, trivial MR. It was recommended he be for ASA/Xarelto and Plavix for one month, then stop ASA. Cath note indicates "Based on 3 overlapping stents, would probably continue Plavix lifelong if possible and no bleeding issues."  2D echo 08/19/15: EF 73-53%, normal diastolic parameters, mild RAE, PASP 37.   Following his recent PCI, he continued to have fatigue, exertional dyspnea and chest pain. F/u CBC showed anemia. Anemia panel was suggestive of IDA and he was placed on Fe supplementation.  He was also diagnosed with AAA an infrarenal fusiform bi-lobed AAA, largest measuring 3.8 cm x 4.7 cm, extending 9.0 cm in length from mid to distal aorta.  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  Over the last few days he has been feeling weak with anorexia and has missed several doses of  Xarelto. He went to the cancer center for his chemo and he was noted to have tachycardia and hypotension and was directed to the ED. He was started on IV dilt and then IV amiodarone as well as IV heparin. He was transferred to [facility].  He is currently back in NSR and is feeling better. He has had recent radiation treatment which he thinks made him out of rhythm. He said he does not want to do radiation anymore. It hurts his chest and he cannot swallow. He has difficulty swallowing due to the radiation. He has trouble taking his pills because of that. He has not been taking his Multaq either due to the size of the pills. He has had some cough but no SOB. No chest pain other than the radiation site. No LE edema, orthopnea or PND.   Problem List  Past Medical History:  Diagnosis Date  . AAA family hx 11/20/2014  . Anemia   . CAD (coronary artery disease)    s/p RCA atherectomy 1994, cath 1999 with occluded RCA with left to right collaterals, PCI of LAD 2007, s/p PCI of LAD for instent thrombosis 2008, PCI 08/2015 with DES to LAD due to ISR with known L-R collaterals to RCA  . COPD (chronic obstructive pulmonary disease) ([place])   . Diabetes mellitus without complication (HCC)    diet controlled   . Dyslipidemia   . Dyspnea   . Dysrhythmia   . Encounter for antineoplastic chemotherapy 03/18/2016  . Goals of care, counseling/discussion  03/18/2016  . Hyperlipidemia   . Hypertension   . Insomnia   . Lung cancer [facility])    s/p lobectomy, chemo and radiation therapy  . OSA (obstructive sleep apnea)    intolerant to CPAP  . PAF (paroxysmal atrial fibrillation) ([place])   . Pneumonia   . Psoriasis   . Squamous cell carcinoma of lung, stage III, left ([place]) 03/18/2016  . Tobacco abuse     Past Surgical History:  Procedure Laterality Date  . APPENDECTOMY    . CARDIAC CATHETERIZATION  1999  . CARDIAC CATHETERIZATION N/A 08/22/2015   Procedure: Left Heart Cath and Coronary Angiography;  Surgeon: Leonie Man, MD;  Location: [place] [facility];  Service: Cardiovascular;  Laterality: N/A;  . CARDIAC CATHETERIZATION N/A 08/22/2015   Procedure: Coronary Stent Intervention;  Surgeon: Leonie Man, MD;  Location: [place] [facility];  Service: Cardiovascular;  Laterality: N/A;  . EXPLORATORY LAPAROTOMY     secondary to trauma  . multiple PCT's to RCA and LAD  2007/2008  . s/p lung lobectomy  2007  . VIDEO BRONCHOSCOPY N/A 08/28/2012   Procedure: VIDEO BRONCHOSCOPY WITHOUT FLUORO;  Surgeon: Elsie Stain, MD;  Location: [place];  Service: Cardiopulmonary;  Laterality: N/A;  . VIDEO BRONCHOSCOPY Bilateral 03/11/2016   Procedure: VIDEO BRONCHOSCOPY WITHOUT FLUORO;  Surgeon: Tanda Rockers, MD;  Location: [facility];  Service: Cardiopulmonary;  Laterality: Bilateral;     Allergies  Allergies  Allergen Reactions  . Gadolinium Derivatives Hives, Shortness Of Breath, Itching, Swelling and Cough    Pt had severe hives and whelps with swelling around eyes and facial swelling, sob and wheezing  immed post gad inj         Home Medications  Prior to Admission medications   Medication Sig Start Date End Date Taking? Authorizing Provider  albuterol (PROAIR HFA) 108 (90 Base) MCG/ACT inhaler Inhale 2 puffs into the lungs every 4 (four) hours as needed for wheezing or shortness of breath.    Yes [provider]  clopidogrel (PLAVIX) 75 MG tablet Take 1 tablet (75 mg total) by mouth daily with breakfast. 09/02/15  Yes Dunn, Dayna N, PA-C  ferrous sulfate 325 (65 FE) MG tablet Take 325 mg by mouth at bedtime.    Yes [provider]  isosorbide mononitrate (IMDUR) 30 MG 24 hr tablet Take 1 tablet (30 mg total) by mouth daily. Patient taking differently: Take 30 mg by mouth at bedtime.  10/14/15 05/10/16 Yes Bhagat, Bhavinkumar, PA  magic mouthwash w/lidocaine SOLN Take 5 mLs by mouth every 4 (four) hours as needed for mouth pain. 05/07/16  Yes Kyung Rudd, MD  nitroGLYCERIN  (NITROSTAT) 0.4 MG SL tablet Place 0.4 mg under the tongue every 5 (five) minutes as needed for chest pain. X 3 doses   Yes [provider]  oxyCODONE-acetaminophen (PERCOCET/ROXICET) 5-325 MG tablet Take 1 tablet by mouth every 4 (four) hours as needed for severe pain. 05/04/16  Yes Gery Pray, MD  pantoprazole (PROTONIX) 40 MG tablet Take 1 tablet (40 mg total) by mouth daily. 09/02/15  Yes Dunn, Dayna N, PA-C  rosuvastatin (CRESTOR) 10 MG tablet Take 1 tablet (10 mg total) by mouth daily. Patient taking differently: Take 10 mg by mouth at bedtime.  10/21/15 05/10/16 Yes Simmons, Brittainy M, PA-C  sucralfate (CARAFATE) 1 GM/10ML suspension Take 10 mLs (1 g total) by mouth 4 (four) times daily -  with meals and at bedtime. 04/28/16  Yes Gery Pray, MD  Tiotropium Bromide-Olodaterol (STIOLTO RESPIMAT) 2.5-2.5 MCG/ACT AERS Inhale 2 puffs into the lungs daily. 02/25/16  Yes Tanda Rockers, MD  traZODone (DESYREL) 100 MG tablet Take 200 mg by mouth at bedtime.    Yes [provider]  XARELTO 20 MG TABS tablet Take 20 mg by mouth daily. 02/17/16  Yes [provider]  acetaminophen-codeine (TYLENOL #3) 300-30 MG tablet One every 4 hours as needed for cough Patient not taking: Reported on 04/05/2016 03/04/16   Tanda Rockers, MD  benzonatate (TESSALON) 100 MG capsule Take 1 capsule (100 mg total) by mouth 3 (three) times daily as needed for cough. Patient not taking: Reported on 04/26/2016 04/12/16   Curt Bears, MD  famotidine (PEPCID) 20 MG tablet One at bedtime Patient not taking: Reported on 04/15/2016 11/25/15   Tanda Rockers, MD  HYDROcodone-homatropine [facility]) 5-1.5 MG/5ML syrup Take 5 mLs by mouth every 6 (six) hours as needed for cough. Patient not taking: Reported on 05/07/2016 04/12/16   Curt Bears, MD  MULTAQ 400 MG tablet TAKE 1 TABLET BY MOUTH TWICE A DAY 30 Patient not taking: Reported on 05/10/2016 10/06/15   Sueanne Margarita, MD  ondansetron (ZOFRAN) 8 MG  tablet Take 1 tablet (8 mg total) by mouth every 8 (eight) hours as needed for nausea or vomiting. Patient not taking: Reported on 05/07/2016 03/23/16   Curt Bears, MD  promethazine (PHENERGAN) 25 MG tablet Take 1 tablet (25 mg total) by mouth every 6 (six) hours as needed for nausea or vomiting. Patient not taking: Reported on 04/26/2016 03/29/16   Curt Bears, MD    Family History  Family History  Problem Relation Age of Onset  . Heart attack Father   . Heart disease Mother   . Heart attack Mother   . Coronary artery disease Brother   . Lymphoma Brother   . Breast cancer Sister   . Diabetes Sister   . Heart disease Sister   . Colon cancer Neg Hx   . Stomach cancer Neg Hx    Family Status  Relation Status  . Father Deceased  . Mother Deceased  . Brother   . Brother   . Sister   . Sister   . Sister   . Neg Hx      Social History  Social History   Social History  . Marital status: Married    Spouse name: N/A  . Number of children: 3  . Years of education: N/A   Occupational History  . [employer]   Social History Main Topics  . Smoking status: Current Some Day Smoker    Packs/day: 0.25    Years: 58.00    Types: Cigarettes  . Smokeless tobacco: Current User    Types: Snuff  . Alcohol use No  . Drug use: No  . Sexual activity: Not on file   Other Topics Concern  . Not on file   Social History Narrative  . No narrative on file     Review of Systems General:  No chills, fever, night sweats or weight changes.  Cardiovascular:  No chest pain, dyspnea on exertion, edema, orthopnea, +++palpitations, no paroxysmal nocturnal dyspnea. Dermatological: No rash, lesions/masses Respiratory: No cough, dyspnea Urologic: No hematuria, dysuria Abdominal:   No nausea, vomiting, diarrhea, bright red blood per rectum, melena, or hematemesis Neurologic:  No visual changes, wkns, changes in mental status. All other systems reviewed and are otherwise  negative except as noted above.  Physical Exam  Blood pressure 101/61, pulse 70, temperature 97.8 F (36.6 C), temperature source Oral, resp. rate 20, height '5\' 11"'$  (1.803 m), weight 181 lb 7 oz (82.3 kg), SpO2 100 %.  General: Pleasant, NAD Psych: Normal affect. Neuro: Alert and oriented X 3. Moves all extremities spontaneously. HEENT: Normal  Neck: Supple without bruits or JVD. Lungs:  Resp regular and unlabored, decreased breath sounds, with some rhonchi Heart: regular with ectopy and soft systolic murmur. no s3, s4 Abdomen: Soft, non-tender, non-distended, BS + x 4.  Extremities: No clubbing, cyanosis or trace ankle edema. DP/PT/Radials 2+ and equal bilaterally.  Labs   Recent Labs  05/10/16 1517  TROPONINI 0.04*   Lab Results  Component Value Date   WBC 5.0 05/10/2016   HGB 13.7 05/10/2016   HCT 42.4 05/10/2016   MCV 81.4 05/10/2016   PLT 135 (L) 05/10/2016    Recent Labs Lab 05/10/16 1253  NA 143  K 3.5  CO2 24  BUN 11.2  CREATININE 1.2  CALCIUM 9.2  PROT 6.2*  BILITOT 0.92  ALKPHOS 81  ALT 36  AST 24  GLUCOSE 132   Lab Results  Component Value Date   CHOL 72 (L) 09/02/2015   HDL 30 (L) 09/02/2015   LDLCALC 27 09/02/2015   TRIG 74 09/02/2015   No results found for: DDIMER   Radiology/Studies  Dg Chest Port 1 View  Result Date: 05/10/2016 CLINICAL DATA:  Hypotension in atrial fibrillation today. History of lung cancer. EXAM: PORTABLE CHEST 1 VIEW COMPARISON:  PET CT scan 03/12/2016. FINDINGS: Radiotherapy seeds in the left mid lung zone are identified as on the prior exam. No consolidative process, pneumothorax or effusion. Heart size is normal. No acute bony abnormality. IMPRESSION: No acute disease. Electronically Signed   By: Inge Rise M.D.   On: 05/10/2016 16:07    ECG  afib with RVR, HR 155  ASSESSMENT AND PLAN  Travis [name] is a 76 y.o. male with [name] (s/p PCI of the RCA 1994 with repeat cath 1999 with occluded RCA  with left to right collaterals, PCI of LAD 2007, PCI of LAD 2008 for instent thrombosis, PCI 08/2015 with DES to LAD due to ISR with known L-R collaterals to RCA), OSA on CPAP, dyslipidemia, PAF on Xarelto, HTN, COPD, lung CA Stage IV(T2b, N2, M1b), AAA, ongoingtobacco abuse who presented to [facility] on 05/10/16 with afib with RVR and was transferred to [facility] for further evaluation and treatment.  Afib with RVR: now converted after IV amiodarone. Also on IV heparin. He was on Xarelto and Multaq as an outpatient but hadn't been taking recently given size of pills and dysphagia from radiation. Will place back on amio infusion until we have a clear plan on what he can go home on. He will need some sort of AAD going home and will need to resume Xarelto.   CAD s/p multiple PCIs: continue plavix and statin. Not on BB due to resting bradycardia. He has not had any chest pain.   Essential HTN: Bp has been soft. Continue to monitor   AAA: abdominal US confirmed presence of an infrarenal fusiform bi-lobed AAA, largest measuring 3.8 cm x 4.7 cm, extending 9.0 cm in length from mid to distal aorta  Lung cancer: on chemo and radiation   Signed, Angelena Form, PA-C 05/10/2016, 8:08 PM  Pager [phone]   Attending note:  Patient seen and examined. Records reviewed and case discussed with Ms. Liz Claiborne. Mr. Cothron presents in transfer  from [facility], accepted by Dr. Meda Coffee for the management of atrial fibrillation. He is undergoing treatment for stage IV lung cancer, recent XRT and chemotherapy. Noted to have more fatigue and found to be mildly hypotensive and in atrial fibrillation. He has known PAF and has been treated with Multaq and Xarelto typically, not able to take regular medications over the last 10 days due to pain with swallowing. Duration of most recent arrhythmia episode uncertain. He was treated with amiodarone bolus in ER and is now in sinus rhythm with PACs. He is feeling better and  hemodynamically stable in 4N. Plan at this time is to continue Amiodarone GTT and Heparin GTT. Question will be his ability to convert to and take oral medications. He will need to continue antiarrhythmic therapy due to high probability of recurrent PAF and also resume DOAC if possible. He indicates some trouble with medications due to pill size and dysphagia. This may impact choice of medications - not sure if Multaq larger than Amiodarone but this might be reason to change therapy.  Satira Sark, M.D., F.A.C.C.

## 2016-05-11 ENCOUNTER — Ambulatory Visit: Payer: Medicare Other

## 2016-05-11 DIAGNOSIS — I480 Paroxysmal atrial fibrillation: Secondary | ICD-10-CM | POA: Diagnosis not present

## 2016-05-11 DIAGNOSIS — I4891 Unspecified atrial fibrillation: Secondary | ICD-10-CM | POA: Diagnosis not present

## 2016-05-11 DIAGNOSIS — R9431 Abnormal electrocardiogram [ECG] [EKG]: Secondary | ICD-10-CM | POA: Diagnosis not present

## 2016-05-11 LAB — PROTIME-INR
INR: 1.24
Prothrombin Time: 15.7 seconds — ABNORMAL HIGH (ref 11.4–15.2)

## 2016-05-11 LAB — APTT: aPTT: 76 s — ABNORMAL HIGH (ref 24–36)

## 2016-05-11 LAB — CBC
HCT: 33.9 % — ABNORMAL LOW (ref 39.0–52.0)
HEMOGLOBIN: 10.8 g/dL — AB (ref 13.0–17.0)
MCH: 26.4 pg (ref 26.0–34.0)
MCHC: 31.9 g/dL (ref 30.0–36.0)
MCV: 82.9 fL (ref 78.0–100.0)
PLATELETS: 109 10*3/uL — AB (ref 150–400)
RBC: 4.09 MIL/uL — AB (ref 4.22–5.81)
RDW: 18.5 % — ABNORMAL HIGH (ref 11.5–15.5)
WBC: 4.5 10*3/uL (ref 4.0–10.5)

## 2016-05-11 LAB — HEPARIN LEVEL (UNFRACTIONATED): HEPARIN UNFRACTIONATED: 0.72 [IU]/mL — AB (ref 0.30–0.70)

## 2016-05-11 LAB — BASIC METABOLIC PANEL
Anion gap: 8 (ref 5–15)
BUN: 10 mg/dL (ref 6–20)
CHLORIDE: 106 mmol/L (ref 101–111)
CO2: 25 mmol/L (ref 22–32)
CREATININE: 1.04 mg/dL (ref 0.61–1.24)
Calcium: 8.5 mg/dL — ABNORMAL LOW (ref 8.9–10.3)
GFR calc non Af Amer: >60 mL/min (ref >60)
Glucose, Bld: 117 mg/dL — ABNORMAL HIGH (ref 65–99)
Potassium: 3.5 mmol/L (ref 3.5–5.1)
SODIUM: 139 mmol/L (ref 135–145)

## 2016-05-11 LAB — MRSA PCR SCREENING: MRSA by PCR: NEGATIVE

## 2016-05-11 MED ORDER — ISOSORBIDE MONONITRATE ER 30 MG PO TB24: 30.0000 mg | ORAL_TABLET | Freq: Every day | ORAL | 3 refills | Status: DC

## 2016-05-11 MED ORDER — ACETAMINOPHEN 325 MG PO TABS: 650.0000 mg | ORAL_TABLET | ORAL | Status: AC | PRN

## 2016-05-11 MED ORDER — ROSUVASTATIN CALCIUM 10 MG PO TABS: 10.0000 mg | ORAL_TABLET | Freq: Every day | ORAL | Status: DC

## 2016-05-11 MED ORDER — RIVAROXABAN 20 MG PO TABS
20.0000 mg | ORAL_TABLET | Freq: Every day | ORAL | Status: DC
  Administered 2016-05-11: 20 mg via ORAL
  Filled 2016-05-11: qty 1

## 2016-05-11 MED ORDER — DRONEDARONE HCL 400 MG PO TABS: 400.0000 mg | ORAL_TABLET | Freq: Two times a day (BID) | ORAL | Status: DC

## 2016-05-11 NOTE — Progress Notes (Signed)
Discharge instructions given by Loree Fee, RN. IVs removed. Pt dressed. Ready for DC

## 2016-05-11 NOTE — Discharge Summary (Signed)
Patient ID: Travis [name],  MRN: [id number], DOB/AGE: 1940/06/24 76 y.o.  Admit date: 05/10/2016 Discharge date: 05/11/2016  Primary Care Provider: Corine Shelter, PA-C Primary Cardiologist: Dr Radford Pax  Discharge Diagnoses Principal Problem:   Atrial fibrillation with RVR [facility]) Active Problems:   CAD S/P multiple PCIs   Essential hypertension   Chronic anticoagulation-Xarelto   PAF (paroxysmal atrial fibrillation) (HCC)   Dyslipidemia   OSA (obstructive sleep apnea)   Lung cancer (HCC)   AAA (abdominal aortic aneurysm) [facility])    Hospital Course 76 y.o. [name], s/p multiple PCI's-last one Aug 2017 was a DES to LAD for ISR. Other problems include OSA on CPAP, dyslipidemia, PAF on Xarelto, HTN, and lung CA Stage IV(T2b, N2, M1b). He has been getting radiation Rx and this has caused him difficulty swallowing and he has been enable to take his Xarelto or Multaq. He was sent to [facility] ED 05/10/16 from the [place] with tachycardia. He was noted to be in AF with RVR. He was transferred to [facility] and placed on IV Amiodarone and Heparin. He converted to NSR. After discussion with the pharmacist it was decided that the pt could crush his Xarelto and Multaq if needed. He is discharged 05/11/16 and will need a TOC follow up in a week in the office.   Discharge Vitals:  Blood pressure 128/69, pulse 60, temperature 98.6 F (37 C), temperature source Oral, resp. rate 12, height '5\' 11"'$  (1.803 m), weight 181 lb 7 oz (82.3 kg), SpO2 99 %.    Labs: Results for orders placed or performed during the hospital encounter of 05/10/16 (from the past 24 hour(s))  Magnesium     Status: None   Collection Time: 05/10/16  3:17 PM  Result Value Ref Range   Magnesium 1.9 1.7 - 2.4 mg/dL  Troponin I     Status: Abnormal   Collection Time: 05/10/16  3:17 PM  Result Value Ref Range   Troponin I 0.04 (HH) <0.03 ng/mL  APTT     Status: None   Collection Time: 05/10/16  4:17 PM  Result Value Ref  Range   aPTT 33 24 - 36 seconds  Heparin level (unfractionated)     Status: Abnormal   Collection Time: 05/10/16  4:17 PM  Result Value Ref Range   Heparin Unfractionated >2.20 (H) 0.30 - 0.70 IU/mL  MRSA PCR Screening     Status: None   Collection Time: 05/10/16  7:00 PM  Result Value Ref Range   MRSA by PCR NEGATIVE NEGATIVE  CBC     Status: Abnormal   Collection Time: 05/11/16  5:35 AM  Result Value Ref Range   WBC 4.5 4.0 - 10.5 K/uL   RBC 4.09 (L) 4.22 - 5.81 MIL/uL   Hemoglobin 10.8 (L) 13.0 - 17.0 g/dL   HCT 33.9 (L) 39.0 - 52.0 %   MCV 82.9 78.0 - 100.0 fL   MCH 26.4 26.0 - 34.0 pg   MCHC 31.9 30.0 - 36.0 g/dL   RDW 18.5 (H) 11.5 - 15.5 %   Platelets 109 (L) 150 - 400 K/uL  Heparin level (unfractionated)     Status: Abnormal   Collection Time: 05/11/16  5:35 AM  Result Value Ref Range   Heparin Unfractionated 0.72 (H) 0.30 - 0.70 IU/mL  APTT     Status: Abnormal   Collection Time: 05/11/16  5:35 AM  Result Value Ref Range   aPTT 76 (H) 24 - 36 seconds  Basic metabolic panel     Status: Abnormal   Collection Time: 05/11/16  5:35 AM  Result Value Ref Range   Sodium 139 135 - 145 mmol/L   Potassium 3.5 3.5 - 5.1 mmol/L   Chloride 106 101 - 111 mmol/L   CO2 25 22 - 32 mmol/L   Glucose, Bld 117 (H) 65 - 99 mg/dL   BUN 10 6 - 20 mg/dL   Creatinine, Ser 1.04 0.61 - 1.24 mg/dL   Calcium 8.5 (L) 8.9 - 10.3 mg/dL   GFR calc non Af Amer >60 >60 mL/min   GFR calc Af Amer >60 >60 mL/min   Anion gap 8 5 - 15  Protime-INR     Status: Abnormal   Collection Time: 05/11/16  5:35 AM  Result Value Ref Range   Prothrombin Time 15.7 (H) 11.4 - 15.2 seconds   INR 1.24     Disposition:  Follow-up Information    Sueanne Margarita, MD Follow up.   Specialty:  Cardiology Why:  Office will call you Contact information: [address] [place] [address] [phone]           Discharge Medications:  Allergies as of 05/11/2016      Reactions   Gadolinium  Derivatives Hives, Shortness Of Breath, Itching, Swelling, Cough   Pt had severe hives and whelps with swelling around eyes and facial swelling, sob and wheezing  immed post gad inj          Medication List    STOP taking these medications   acetaminophen-codeine 300-30 MG tablet Commonly known as:  TYLENOL #3   benzonatate 100 MG capsule Commonly known as:  TESSALON   HYDROcodone-homatropine 5-1.5 MG/5ML syrup Commonly known as:  HYCODAN   promethazine 25 MG tablet Commonly known as:  PHENERGAN     TAKE these medications   acetaminophen 325 MG tablet Commonly known as:  TYLENOL Take 2 tablets (650 mg total) by mouth every 4 (four) hours as needed for headache or mild pain.   clopidogrel 75 MG tablet Commonly known as:  PLAVIX Take 1 tablet (75 mg total) by mouth daily with breakfast.   ferrous sulfate 325 (65 FE) MG tablet Take 325 mg by mouth at bedtime.   isosorbide mononitrate 30 MG 24 hr tablet Commonly known as:  IMDUR Take 1 tablet (30 mg total) by mouth daily. What changed:  when to take this   magic mouthwash w/lidocaine Soln Take 5 mLs by mouth every 4 (four) hours as needed for mouth pain.   MULTAQ 400 MG tablet Generic drug:  dronedarone TAKE 1 TABLET BY MOUTH TWICE A DAY 30   nitroGLYCERIN 0.4 MG SL tablet Commonly known as:  NITROSTAT Place 0.4 mg under the tongue every 5 (five) minutes as needed for chest pain. X 3 doses   ondansetron 8 MG tablet Commonly known as:  ZOFRAN Take 1 tablet (8 mg total) by mouth every 8 (eight) hours as needed for nausea or vomiting.   oxyCODONE-acetaminophen 5-325 MG tablet Commonly known as:  PERCOCET/ROXICET Take 1 tablet by mouth every 4 (four) hours as needed for severe pain.   pantoprazole 40 MG tablet Commonly known as:  PROTONIX Take 1 tablet (40 mg total) by mouth daily.   PROAIR HFA 108 (90 Base) MCG/ACT inhaler Generic drug:  albuterol Inhale 2 puffs into the lungs every 4 (four) hours as needed for  wheezing or shortness of breath.   rosuvastatin 10 MG tablet Commonly  known as:  CRESTOR Take 1 tablet (10 mg total) by mouth at bedtime.   sucralfate 1 GM/10ML suspension Commonly known as:  CARAFATE Take 10 mLs (1 g total) by mouth 4 (four) times daily -  with meals and at bedtime.   Tiotropium Bromide-Olodaterol 2.5-2.5 MCG/ACT Aers Commonly known as:  STIOLTO RESPIMAT Inhale 2 puffs into the lungs daily.   traZODone 100 MG tablet Commonly known as:  DESYREL Take 200 mg by mouth at bedtime.   XARELTO 20 MG Tabs tablet Generic drug:  rivaroxaban Take 20 mg by mouth daily.        Duration of Discharge Encounter: Greater than 30 minutes including physician time.  Angelena Form PA-C 05/11/2016 10:10 AM

## 2016-05-11 NOTE — Discharge Instructions (Signed)
OK to crush pills if needed

## 2016-05-11 NOTE — Progress Notes (Signed)
Patient Name: Travis [name] Date of Encounter: 05/11/2016  Active Problems:   Atrial fibrillation with RVR ([place])   Atrial fibrillation with rapid ventricular response ([place])   Length of Stay: 1  SUBJECTIVE  The patient feels better today.  CURRENT MEDS . clopidogrel  75 mg Oral Q breakfast  . ferrous sulfate  325 mg Oral QHS  . isosorbide mononitrate  30 mg Oral QHS  . mouth rinse  15 mL Mouth Rinse BID  . pantoprazole  40 mg Oral Daily  . rosuvastatin  10 mg Oral QHS  . sucralfate  1 g Oral TID WC & HS  . traZODone  200 mg Oral QHS    OBJECTIVE  Vitals:   05/11/16 0619 05/11/16 0700 05/11/16 0800 05/11/16 0833  BP:  127/65 127/74 (!) 141/88  Pulse: 65 (!) 50 64 (!) 54  Resp: '14 14 13 16  '$ Temp:    98.6 F (37 C)  TempSrc:    Oral  SpO2: 98% 97% 97% 98%  Weight:      Height:        Intake/Output Summary (Last 24 hours) at 05/11/16 0917 Last data filed at 05/11/16 0800  Gross per 24 hour  Intake          1759.33 ml  Output              400 ml  Net          1359.33 ml   Filed Weights   05/10/16 1456 05/10/16 1855  Weight: 191 lb (86.6 kg) 181 lb 7 oz (82.3 kg)    PHYSICAL EXAM  General: Pleasant, NAD. Neuro: Alert and oriented X 3. Moves all extremities spontaneously. Psych: Normal affect. HEENT:  Normal  Neck: Supple without bruits or JVD. Lungs:  Resp regular and unlabored, CTA. Heart: RRR no s3, s4, or murmurs. Abdomen: Soft, non-tender, non-distended, BS + x 4.  Extremities: No clubbing, cyanosis or edema. DP/PT/Radials 2+ and equal bilaterally.  Accessory Clinical Findings  CBC  Recent Labs  05/10/16 1253 05/11/16 0535  WBC 5.0 4.5  NEUTROABS 3.3  --   HGB 13.7 10.8*  HCT 42.4 33.9*  MCV 81.4 82.9  PLT 135* 702*   Basic Metabolic Panel  Recent Labs  05/10/16 1253 05/10/16 1517 05/11/16 0535  NA 143  --  139  K 3.5  --  3.5  CL  --   --  106  CO2 24  --  25  GLUCOSE 132  --  117*  BUN 11.2  --  10  CREATININE 1.2  --   1.04  CALCIUM 9.2  --  8.5*  MG  --  1.9  --    Liver Function Tests  Recent Labs  05/10/16 1253  AST 24  ALT 36  ALKPHOS 81  BILITOT 0.92  PROT 6.2*  ALBUMIN 3.3*   No results for input(s): LIPASE, AMYLASE in the last 72 hours. Cardiac Enzymes  Recent Labs  05/10/16 1517  TROPONINI 0.04*   Dg Chest Port 1 View  Result Date: 05/10/2016 CLINICAL DATA:  Hypotension in atrial fibrillation today. History of lung cancer. EXAM: PORTABLE CHEST 1 VIEW COMPARISON:  PET CT scan 03/12/2016. FINDINGS: Radiotherapy seeds in the left mid lung zone are identified as on the prior exam. No consolidative process, pneumothorax or effusion. Heart size is normal. No acute bony abnormality. IMPRESSION: No acute disease. Electronically Signed   By: Inge Rise M.D.   On: 05/10/2016 16:07  TELE: SR with frequent PACs, rate controlled, in 60', personally reviewed.     ASSESSMENT AND PLAN   Travis [name] is a 76 y.o. male with [name] (s/p PCI of the RCA 1994 with repeat cath 1999 with occluded RCA with left to right collaterals, PCI of LAD 2007, PCI of LAD 2008 for instent thrombosis, PCI 08/2015 with DES to LAD due to ISR with known L-R collaterals to RCA), OSA on CPAP, dyslipidemia, PAF on Xarelto, HTN, COPD, lung CA Stage IV(T2b, N2, M1b), AAA, ongoingtobacco abuse who presented to [facility] on 05/10/16 with afib with RVR and was transferred to [facility] for further evaluation and treatment.  Afib with RVR: now converted after IV amiodarone. Also on IV heparin.  I have talked to the pharmacy, they stated that amiodarone and Multaq and xarelto and Eliquis are about the same size. But they can all be crushed. We will discontinue iv heparin and amiodarone and restart home medications. He is instructed to crush th epills and take them with an apple sauce or other liquid.  He can be discharged now, we will arrange for an outpatient follow up the next week.   CAD s/p multiple PCIs: continue  plavix and statin. Not on BB due to resting bradycardia. He has not had any chest pain.   Essential HTN: Bp has been soft. Continue to monitor   AAA: abdominal US confirmed presence of an infrarenal fusiform bi-lobed AAA, largest measuring 3.8 cm x 4.7 cm, extending 9.0 cm in length from mid to distal aorta  Lung cancer: on chemo and radiation    Signed, Ena Dawley MD, [facility] 05/11/2016

## 2016-05-11 NOTE — Progress Notes (Signed)
Pt lost sole IV access.  Attempted restart X2 by 2 RNs.  IVT paged to evaluate pt with poor access.  IVT placed two new lines for IV meds at 0050.  Heparin gtt and Amiodarone gtt started at 0057. Will cont to monitor.

## 2016-05-11 NOTE — Progress Notes (Signed)
ANTICOAGULATION CONSULT NOTE - Follow-up Consult  Pharmacy Consult for Heparin Indication: atrial fibrillation  Allergies  Allergen Reactions  . Gadolinium Derivatives Hives, Shortness Of Breath, Itching, Swelling and Cough    Pt had severe hives and whelps with swelling around eyes and facial swelling, sob and wheezing  immed post gad inj        Patient Measurements: Height: '5\' 11"'$  (180.3 cm) Weight: 181 lb 7 oz (82.3 kg) IBW/kg (Calculated) : 75.3  Vital Signs: Temp: 98.2 F (36.8 C) (05/08 0400) Temp Source: Oral (05/08 0400) BP: 116/69 (05/08 0600) Pulse Rate: 65 (05/08 0619)  Labs:  Recent Labs  05/10/16 1253 05/10/16 1517 05/10/16 1617 05/11/16 0535  HGB 13.7  --   --  10.8*  HCT 42.4  --   --  33.9*  PLT 135*  --   --  PENDING  APTT  --   --  33 76*  LABPROT  --   --   --  15.7*  INR  --   --   --  1.24  HEPARINUNFRC  --   --  >2.20* 0.72*  CREATININE 1.2  --   --   --   TROPONINI  --  0.04*  --   --     Estimated Creatinine Clearance: 54.9 mL/min (by C-G formula based on SCr of 1.2 mg/dL).  Assessment:  76 yr male with PMH significant for lung cancer and AFib.  PTA patient on Xarelto '20mg'$  daily with last dose reported as taken on 5/6 @ 22:00. Baseline HL > 2.2 so Xarelto affecting. Baseline PTT 33 sec  Pharmacy consulted to dose IV Heparin for AFib  PTT therapeutic (76 sec) on gtt at 1250 units/hr. Hgb down to 10.8, no bleeding noted. Awaiting plt count.  Goal of Therapy:  Heparin level 0.3-0.7 units/ml aPTT 66-102 seconds Monitor platelets by anticoagulation protocol: Yes   Plan:  Continue IV heparin infusion @ 1250 units/hr  Will f/u 6hr PTT to confirm therapeutic  Sherlon Handing, PharmD, BCPS Clinical pharmacist, pager [phone] 05/11/2016,6:22 AM

## 2016-05-12 ENCOUNTER — Ambulatory Visit: Payer: Medicare Other

## 2016-05-13 ENCOUNTER — Ambulatory Visit: Payer: Medicare Other

## 2016-05-14 ENCOUNTER — Ambulatory Visit: Payer: Medicare Other

## 2016-05-17 ENCOUNTER — Ambulatory Visit: Payer: Medicare Other

## 2016-05-17 ENCOUNTER — Other Ambulatory Visit (HOSPITAL_BASED_OUTPATIENT_CLINIC_OR_DEPARTMENT_OTHER): Payer: Medicare Other

## 2016-05-17 DIAGNOSIS — C3492 Malignant neoplasm of unspecified part of left bronchus or lung: Secondary | ICD-10-CM

## 2016-05-17 DIAGNOSIS — C3412 Malignant neoplasm of upper lobe, left bronchus or lung: Secondary | ICD-10-CM | POA: Diagnosis not present

## 2016-05-17 LAB — CBC WITH DIFFERENTIAL/PLATELET
BASO%: 0.4 % (ref 0.0–2.0)
Basophils Absolute: 0 10*3/uL (ref 0.0–0.1)
EOS ABS: 0.1 10*3/uL (ref 0.0–0.5)
EOS%: 0.5 % (ref 0.0–7.0)
HCT: 34.4 % — ABNORMAL LOW (ref 38.4–49.9)
HGB: 11.2 g/dL — ABNORMAL LOW (ref 13.0–17.1)
LYMPH%: 3.9 % — AB (ref 14.0–49.0)
MCH: 26.5 pg — ABNORMAL LOW (ref 27.2–33.4)
MCHC: 32.7 g/dL (ref 32.0–36.0)
MCV: 81.2 fL (ref 79.3–98.0)
MONO#: 0.9 10*3/uL (ref 0.1–0.9)
MONO%: 7.9 % (ref 0.0–14.0)
NEUT%: 87.3 % — ABNORMAL HIGH (ref 39.0–75.0)
NEUTROS ABS: 10 10*3/uL — AB (ref 1.5–6.5)
PLATELETS: 140 10*3/uL (ref 140–400)
RBC: 4.23 10*6/uL (ref 4.20–5.82)
RDW: 19.6 % — ABNORMAL HIGH (ref 11.0–14.6)
WBC: 11.5 10*3/uL — AB (ref 4.0–10.3)
lymph#: 0.5 10*3/uL — ABNORMAL LOW (ref 0.9–3.3)

## 2016-05-17 LAB — COMPREHENSIVE METABOLIC PANEL
ALT: 23 U/L (ref 0–55)
ANION GAP: 9 meq/L (ref 3–11)
AST: 15 U/L (ref 5–34)
Albumin: 2.6 g/dL — ABNORMAL LOW (ref 3.5–5.0)
Alkaline Phosphatase: 90 U/L (ref 40–150)
BILIRUBIN TOTAL: 1.64 mg/dL — AB (ref 0.20–1.20)
BUN: 11 mg/dL (ref 7.0–26.0)
CO2: 26 meq/L (ref 22–29)
CREATININE: 1.1 mg/dL (ref 0.7–1.3)
Calcium: 8.9 mg/dL (ref 8.4–10.4)
Chloride: 103 mEq/L (ref 98–109)
EGFR: 67 mL/min/{1.73_m2} — ABNORMAL LOW (ref >90)
GLUCOSE: 175 mg/dL — AB (ref 70–140)
Potassium: 3.9 mEq/L (ref 3.5–5.1)
SODIUM: 137 meq/L (ref 136–145)
TOTAL PROTEIN: 6.3 g/dL — AB (ref 6.4–8.3)

## 2016-05-18 ENCOUNTER — Ambulatory Visit: Payer: Medicare Other

## 2016-05-19 ENCOUNTER — Ambulatory Visit: Payer: Medicare Other

## 2016-05-20 ENCOUNTER — Ambulatory Visit: Payer: Medicare Other

## 2016-05-21 ENCOUNTER — Telehealth (HOSPITAL_COMMUNITY): Payer: Self-pay | Admitting: *Deleted

## 2016-05-21 ENCOUNTER — Ambulatory Visit: Payer: Medicare Other

## 2016-05-21 NOTE — Telephone Encounter (Signed)
I cld pt regarding referral to afib clinic for ED to hospital admission 05/11/2016.  Per pt spouse, she thanks [place] for calling and for the follow up but was told at the ER to call Dr. Landis Gandy office for follow up.  She stated that the pt is feeling so bad it is hard to get him to the cancer center and she is not sure he will schedule at this time. I asked her to call Dr. Landis Gandy office and atleast speak to triage to see what their recommendation would be and to make sure he is on appropriate meds.  She stated that she would do that.

## 2016-05-24 ENCOUNTER — Ambulatory Visit: Payer: Medicare Other

## 2016-05-25 ENCOUNTER — Ambulatory Visit: Admission: RE | Admit: 2016-05-25 | Payer: Medicare Other | Source: Ambulatory Visit

## 2016-05-28 ENCOUNTER — Other Ambulatory Visit: Payer: Self-pay | Admitting: Medical Oncology

## 2016-05-28 ENCOUNTER — Other Ambulatory Visit (HOSPITAL_BASED_OUTPATIENT_CLINIC_OR_DEPARTMENT_OTHER): Payer: Medicare Other

## 2016-05-28 ENCOUNTER — Ambulatory Visit (HOSPITAL_BASED_OUTPATIENT_CLINIC_OR_DEPARTMENT_OTHER): Payer: Medicare Other | Admitting: Internal Medicine

## 2016-05-28 ENCOUNTER — Telehealth: Payer: Self-pay | Admitting: Medical Oncology

## 2016-05-28 ENCOUNTER — Telehealth: Payer: Self-pay | Admitting: *Deleted

## 2016-05-28 ENCOUNTER — Encounter: Payer: Self-pay | Admitting: Internal Medicine

## 2016-05-28 VITALS — BP 111/79 | HR 70 | Resp 18 | Wt 182.7 lb

## 2016-05-28 DIAGNOSIS — B370 Candidal stomatitis: Secondary | ICD-10-CM

## 2016-05-28 DIAGNOSIS — Z5111 Encounter for antineoplastic chemotherapy: Secondary | ICD-10-CM

## 2016-05-28 DIAGNOSIS — C3412 Malignant neoplasm of upper lobe, left bronchus or lung: Secondary | ICD-10-CM

## 2016-05-28 DIAGNOSIS — R05 Cough: Secondary | ICD-10-CM

## 2016-05-28 DIAGNOSIS — C3492 Malignant neoplasm of unspecified part of left bronchus or lung: Secondary | ICD-10-CM

## 2016-05-28 DIAGNOSIS — C773 Secondary and unspecified malignant neoplasm of axilla and upper limb lymph nodes: Secondary | ICD-10-CM | POA: Diagnosis not present

## 2016-05-28 DIAGNOSIS — J449 Chronic obstructive pulmonary disease, unspecified: Secondary | ICD-10-CM

## 2016-05-28 DIAGNOSIS — R634 Abnormal weight loss: Secondary | ICD-10-CM | POA: Diagnosis not present

## 2016-05-28 DIAGNOSIS — R0602 Shortness of breath: Secondary | ICD-10-CM

## 2016-05-28 LAB — COMPREHENSIVE METABOLIC PANEL
ALBUMIN: 2 g/dL — AB (ref 3.5–5.0)
ALK PHOS: 162 U/L — AB (ref 40–150)
ALT: 29 U/L (ref 0–55)
ANION GAP: 12 meq/L — AB (ref 3–11)
AST: 23 U/L (ref 5–34)
BILIRUBIN TOTAL: 2.3 mg/dL — AB (ref 0.20–1.20)
BUN: 40.8 mg/dL — ABNORMAL HIGH (ref 7.0–26.0)
CO2: 25 mEq/L (ref 22–29)
Calcium: 10 mg/dL (ref 8.4–10.4)
Chloride: 94 mEq/L — ABNORMAL LOW (ref 98–109)
Creatinine: 1.3 mg/dL (ref 0.7–1.3)
EGFR: 54 mL/min/{1.73_m2} — AB (ref >90)
Glucose: 225 mg/dl — ABNORMAL HIGH (ref 70–140)
Potassium: 4.6 mEq/L (ref 3.5–5.1)
Sodium: 132 mEq/L — ABNORMAL LOW (ref 136–145)
TOTAL PROTEIN: 6.3 g/dL — AB (ref 6.4–8.3)

## 2016-05-28 LAB — CBC WITH DIFFERENTIAL/PLATELET
BASO%: 0.1 % (ref 0.0–2.0)
BASOS ABS: 0 10*3/uL (ref 0.0–0.1)
EOS ABS: 0 10*3/uL (ref 0.0–0.5)
EOS%: 0.1 % (ref 0.0–7.0)
HCT: 36.8 % — ABNORMAL LOW (ref 38.4–49.9)
HEMOGLOBIN: 11.6 g/dL — AB (ref 13.0–17.1)
LYMPH#: 0.7 10*3/uL — AB (ref 0.9–3.3)
LYMPH%: 3.6 % — ABNORMAL LOW (ref 14.0–49.0)
MCH: 26.5 pg — ABNORMAL LOW (ref 27.2–33.4)
MCHC: 31.5 g/dL — ABNORMAL LOW (ref 32.0–36.0)
MCV: 84.2 fL (ref 79.3–98.0)
MONO#: 0.7 10*3/uL (ref 0.1–0.9)
MONO%: 3.5 % (ref 0.0–14.0)
NEUT#: 18.6 10*3/uL — ABNORMAL HIGH (ref 1.5–6.5)
NEUT%: 92.7 % — AB (ref 39.0–75.0)
NRBC: 0 % (ref 0–0)
PLATELETS: 260 10*3/uL (ref 140–400)
RBC: 4.37 10*6/uL (ref 4.20–5.82)
RDW: 18.7 % — AB (ref 11.0–14.6)
WBC: 20 10*3/uL — ABNORMAL HIGH (ref 4.0–10.3)

## 2016-05-28 MED ORDER — METHYLPREDNISOLONE 4 MG PO TBPK: ORAL_TABLET | ORAL | 0 refills | Status: DC

## 2016-05-28 MED ORDER — HYDROCODONE-HOMATROPINE 5-1.5 MG/5ML PO SYRP: 5.0000 mL | ORAL_SOLUTION | Freq: Four times a day (QID) | ORAL | 0 refills | Status: AC | PRN

## 2016-05-28 MED ORDER — FLUCONAZOLE 100 MG PO TABS: 100.0000 mg | ORAL_TABLET | Freq: Every day | ORAL | 0 refills | Status: DC

## 2016-05-28 MED ORDER — NYSTATIN 100000 UNIT/ML MT SUSP: OROMUCOSAL | 0 refills | Status: DC

## 2016-05-28 NOTE — Telephone Encounter (Signed)
MD Burr Medico contacted. Nystatin prescription sent to pharmacy. Patient family member notified.

## 2016-05-28 NOTE — Addendum Note (Signed)
Addended by: Kellie Simmering A on: 05/28/2016 03:59 PM   Modules accepted: Orders

## 2016-05-28 NOTE — Patient Instructions (Signed)
Steps to Quit Smoking Smoking tobacco can be bad for your health. It can also affect almost every organ in your body. Smoking puts you and people around you at risk for many serious long-lasting (chronic) diseases. Quitting smoking is hard, but it is one of the best things that you can do for your health. It is never too late to quit. What are the benefits of quitting smoking? When you quit smoking, you lower your risk for getting serious diseases and conditions. They can include:  Lung cancer or lung disease.  Heart disease.  Stroke.  Heart attack.  Not being able to have children (infertility).  Weak bones (osteoporosis) and broken bones (fractures). If you have coughing, wheezing, and shortness of breath, those symptoms may get better when you quit. You may also get sick less often. If you are pregnant, quitting smoking can help to lower your chances of having a baby of low birth weight. What can I do to help me quit smoking? Talk with your doctor about what can help you quit smoking. Some things you can do (strategies) include:  Quitting smoking totally, instead of slowly cutting back how much you smoke over a period of time.  Going to in-person counseling. You are more likely to quit if you go to many counseling sessions.  Using resources and support systems, such as:  Online chats with a counselor.  Phone quitlines.  Printed self-help materials.  Support groups or group counseling.  Text messaging programs.  Mobile phone apps or applications.  Taking medicines. Some of these medicines may have nicotine in them. If you are pregnant or breastfeeding, do not take any medicines to quit smoking unless your doctor says it is okay. Talk with your doctor about counseling or other things that can help you. Talk with your doctor about using more than one strategy at the same time, such as taking medicines while you are also going to in-person counseling. This can help make quitting  easier. What things can I do to make it easier to quit? Quitting smoking might feel very hard at first, but there is a lot that you can do to make it easier. Take these steps:  Talk to your family and friends. Ask them to support and encourage you.  Call phone quitlines, reach out to support groups, or work with a counselor.  Ask people who smoke to not smoke around you.  Avoid places that make you want (trigger) to smoke, such as:  Bars.  Parties.  Smoke-break areas at work.  Spend time with people who do not smoke.  Lower the stress in your life. Stress can make you want to smoke. Try these things to help your stress:  Getting regular exercise.  Deep-breathing exercises.  Yoga.  Meditating.  Doing a body scan. To do this, close your eyes, focus on one area of your body at a time from head to toe, and notice which parts of your body are tense. Try to relax the muscles in those areas.  Download or buy apps on your mobile phone or tablet that can help you stick to your quit plan. There are many free apps, such as QuitGuide from the CDC (Centers for Disease Control and Prevention). You can find more support from smokefree.gov and other websites. This information is not intended to replace advice given to you by your health care provider. Make sure you discuss any questions you have with your health care provider. Document Released: 10/17/2008 Document Revised: 08/19/2015 Document   Reviewed: 05/07/2014 Elsevier Interactive Patient Education  2017 Elsevier Inc.  

## 2016-05-28 NOTE — Progress Notes (Signed)
[place] [phone]   [phone]  OFFICE PROGRESS NOTE  Corine Shelter, PA-C [place] [address]  DIAGNOSIS: Stage IV (T2b, N2, M1b) presented with left upper lobe lung nodule in addition to mediastinal lymphadenopathy as well as metastatic disease to the left axillary lymph nodes diagnosed in March 2018. PDL 1 is 10%  PRIOR THERAPY: Short course of Concurrent chemoradiation with weekly carboplatin for AUC of 2 and paclitaxel 45 MG/M2. First dose 04/12/2016.   CURRENT THERAPY: Systemic chemotherapy with carboplatin for AUC of 5 and paclitaxel 175 MG/M2 every 3 weeks. First dose 06/01/2016.  INTERVAL HISTORY: Travis [name] 76 y.o. male returns to the clinic today for follow-up visit accompanied by his wife. The patient continues to complain of fatigue and weakness. He also has lack of appetite and started developing oral thrush. He weight has been stable. He continues to have dry cough. The patient denied having any current fever or chills. He has no nausea, vomiting, diarrhea or constipation. He has no chest pain or hemoptysis. He denied having any headache or visual changes. Is here today for evaluation before starting the first cycle of chemotherapy early next week.  MEDICAL HISTORY: Past Medical History:  Diagnosis Date  . AAA (abdominal aortic aneurysm) ([place])   . Anemia   . CAD (coronary artery disease)    s/p RCA atherectomy 1994, cath 1999 with occluded RCA with left to right collaterals, PCI of LAD 2007, s/p PCI of LAD for instent thrombosis 2008, PCI 08/2015 with DES to LAD due to ISR with known L-R collaterals to RCA  . COPD (chronic obstructive pulmonary disease) ([place])   . Diabetes mellitus without complication ([facility])    diet controlled   . Dyslipidemia   . Hyperlipidemia   . Hypertension   . Insomnia   . Lung cancer [facility])    s/p lobectomy, chemo and radiation therapy  . OSA (obstructive sleep apnea)    intolerant to CPAP  . PAF (paroxysmal atrial fibrillation) ([place])   . Psoriasis   . Squamous cell carcinoma of lung, stage III, left ([place]) 03/18/2016  . Tobacco abuse     ALLERGIES:  is allergic to gadolinium derivatives.  MEDICATIONS:  Current Outpatient Prescriptions  Medication Sig Dispense Refill  . acetaminophen (TYLENOL) 325 MG tablet Take 2 tablets (650 mg total) by mouth every 4 (four) hours as needed for headache or mild pain.    Marland Kitchen albuterol (PROAIR HFA) 108 (90 Base) MCG/ACT inhaler Inhale 2 puffs into the lungs every 4 (four) hours as needed for wheezing or shortness of breath.     . clopidogrel (PLAVIX) 75 MG tablet Take 1 tablet (75 mg total) by mouth daily with breakfast. 90 tablet 3  . ferrous sulfate 325 (65 FE) MG tablet Take 325 mg by mouth at bedtime.     . isosorbide mononitrate (IMDUR) 30 MG 24 hr tablet Take 1 tablet (30 mg total) by mouth daily. 90 tablet 3  . magic mouthwash w/lidocaine SOLN Take 5 mLs by mouth every 4 (four) hours as needed for mouth pain. 500 mL 1  . MULTAQ 400 MG tablet TAKE 1 TABLET BY MOUTH TWICE A DAY 30 (Patient not taking: Reported on 05/10/2016) 60 tablet 10  . nitroGLYCERIN (NITROSTAT) 0.4 MG SL tablet Place 0.4 mg under the tongue every 5 (five) minutes as needed for chest pain. X 3 doses    . ondansetron (ZOFRAN) 8 MG tablet  Take 1 tablet (8 mg total) by mouth every 8 (eight) hours as needed for nausea or vomiting. (Patient not taking: Reported on 05/07/2016) 20 tablet 2  . oxyCODONE-acetaminophen (PERCOCET/ROXICET) 5-325 MG tablet Take 1 tablet by mouth every 4 (four) hours as needed for severe pain. 30 tablet 0  . pantoprazole (PROTONIX) 40 MG tablet Take 1 tablet (40 mg total) by mouth daily. 90 tablet 3  . rosuvastatin (CRESTOR) 10 MG tablet Take 1 tablet (10 mg total) by mouth at bedtime.    . sucralfate (CARAFATE) 1 GM/10ML suspension Take 10 mLs (1 g total) by mouth 4 (four) times daily -  with meals and at bedtime. 480 mL 1  . Tiotropium  Bromide-Olodaterol (STIOLTO RESPIMAT) 2.5-2.5 MCG/ACT AERS Inhale 2 puffs into the lungs daily. 1 Inhaler 0  . traZODone (DESYREL) 100 MG tablet Take 200 mg by mouth at bedtime.     Alveda Reasons 20 MG TABS tablet Take 20 mg by mouth daily.  3   No current facility-administered medications for this visit.     SURGICAL HISTORY:  Past Surgical History:  Procedure Laterality Date  . APPENDECTOMY    . CARDIAC CATHETERIZATION  1999  . CARDIAC CATHETERIZATION N/A 08/22/2015   Procedure: Left Heart Cath and Coronary Angiography;  Surgeon: Leonie Man, MD;  Location: [facility];  Service: Cardiovascular;  Laterality: N/A;  . CARDIAC CATHETERIZATION N/A 08/22/2015   Procedure: Coronary Stent Intervention;  Surgeon: Leonie Man, MD;  Location: [facility];  Service: Cardiovascular;  Laterality: N/A;  . EXPLORATORY LAPAROTOMY     secondary to trauma  . multiple PCT's to RCA and LAD  2007/2008  . s/p lung lobectomy  2007  . VIDEO BRONCHOSCOPY N/A 08/28/2012   Procedure: VIDEO BRONCHOSCOPY WITHOUT FLUORO;  Surgeon: Elsie Stain, MD;  Location: [facility];  Service: Cardiopulmonary;  Laterality: N/A;  . VIDEO BRONCHOSCOPY Bilateral 03/11/2016   Procedure: VIDEO BRONCHOSCOPY WITHOUT FLUORO;  Surgeon: Tanda Rockers, MD;  Location: [facility];  Service: Cardiopulmonary;  Laterality: Bilateral;    REVIEW OF SYSTEMS:  Constitutional: positive for fatigue Eyes: negative Ears, nose, mouth, throat, and face: negative Respiratory: positive for cough and dyspnea on exertion Cardiovascular: negative Gastrointestinal: negative Genitourinary:negative Integument/breast: negative Hematologic/lymphatic: negative Musculoskeletal:negative Neurological: negative Behavioral/Psych: negative Endocrine: negative Allergic/Immunologic: negative   PHYSICAL EXAMINATION: General appearance: alert, cooperative, fatigued and no distress Head: Normocephalic, without obvious abnormality,  atraumatic Neck: no adenopathy, no JVD, supple, symmetrical, trachea midline and thyroid not enlarged, symmetric, no tenderness/mass/nodules Lymph nodes: Cervical, supraclavicular, and axillary nodes normal. Resp: wheezes bilaterally Back: symmetric, no curvature. ROM normal. No CVA tenderness. Cardio: regular rate and rhythm, S1, S2 normal, no murmur, click, rub or gallop GI: soft, non-tender; bowel sounds normal; no masses,  no organomegaly Extremities: extremities normal, atraumatic, no cyanosis or edema Neurologic: Alert and oriented X 3, normal strength and tone. Normal symmetric reflexes. Normal coordination and gait  ECOG PERFORMANCE STATUS: 1 - Symptomatic but completely ambulatory  Blood pressure 111/79, pulse 70, resp. rate 18, weight 182 lb 11.2 oz (82.9 kg), SpO2 98 %.  LABORATORY DATA: Lab Results  Component Value Date   WBC 20.0 (H) 05/28/2016   HGB 11.6 (L) 05/28/2016   HCT 36.8 (L) 05/28/2016   MCV 84.2 05/28/2016   PLT 260 05/28/2016      Chemistry      Component Value Date/Time   NA 132 (L) 05/28/2016 0903   K 4.6 05/28/2016 0903   CL  106 05/11/2016 0535   CO2 25 05/28/2016 0903   BUN 40.8 (H) 05/28/2016 0903   CREATININE 1.3 05/28/2016 0903      Component Value Date/Time   CALCIUM 10.0 05/28/2016 0903   ALKPHOS 162 (H) 05/28/2016 0903   AST 23 05/28/2016 0903   ALT 29 05/28/2016 0903   BILITOT 2.30 (H) 05/28/2016 0903       RADIOGRAPHIC STUDIES: Dg Chest Port 1 View  Result Date: 05/10/2016 CLINICAL DATA:  Hypotension in atrial fibrillation today. History of lung cancer. EXAM: PORTABLE CHEST 1 VIEW COMPARISON:  PET CT scan 03/12/2016. FINDINGS: Radiotherapy seeds in the left mid lung zone are identified as on the prior exam. No consolidative process, pneumothorax or effusion. Heart size is normal. No acute bony abnormality. IMPRESSION: No acute disease. Electronically Signed   By: Inge Rise M.D.   On: 05/10/2016 16:07    ASSESSMENT AND PLAN:    This is a very pleasant 76 years old white male with a stage IV non-small cell lung cancer, squamous cell carcinoma presented with left upper lobe mass and mediastinal lymphadenopathy as well as metastatic disease to the left axillary lymph nodes. He underwent a short course of concurrent chemoradiation with weekly carboplatin and paclitaxel. The patient feels a little bit better He will start systemic chemotherapy with carboplatin for AUC of 5 and paclitaxel 175 MG/M2 every 3 weeks early next week. I recommended for the patient to proceed with treatment as planned. For the weight loss and cough, I will start the patient on Medrol Dosepak. He would also continue with his current cough medication and use Delsym as needed. For the oral thrush, I will start the patient on Diflucan 100 mg by mouth daily for 10 days. I would see him back for follow-up visit in 3 weeks for evaluation with the start of cycle #2. He was advised to call immediately if he has any concerning symptoms in the interval. The patient voices understanding of current disease status and treatment options and is in agreement with the current care plan. All questions were answered. The patient knows to call the clinic with any problems, questions or concerns. We can certainly see the patient much sooner if necessary.  Disclaimer: This note was dictated with voice recognition software. Similar sounding words can inadvertently be transcribed and may not be corrected upon review.

## 2016-05-28 NOTE — Telephone Encounter (Signed)
Pharmacist called. Interaction with Diflucan and Multaq- prolonged QT.

## 2016-05-28 NOTE — Telephone Encounter (Signed)
Patient received Diflucan today due to thrush. Patient currently is on [place]. Per pharmacy it has a Category D interaction. Can patient receive an alternative. MD Julien Nordmann is not in office today.

## 2016-06-01 ENCOUNTER — Telehealth: Payer: Self-pay | Admitting: Oncology

## 2016-06-01 ENCOUNTER — Telehealth: Payer: Self-pay | Admitting: *Deleted

## 2016-06-01 ENCOUNTER — Inpatient Hospital Stay (HOSPITAL_COMMUNITY)
Admission: EM | Admit: 2016-06-01 | Discharge: 2016-06-02 | DRG: 189 | Disposition: A | Discharge status: Home / Self Care | Payer: Medicare Other | Attending: Pulmonary Disease | Admitting: Pulmonary Disease

## 2016-06-01 ENCOUNTER — Other Ambulatory Visit: Payer: Medicare Other

## 2016-06-01 ENCOUNTER — Encounter (HOSPITAL_COMMUNITY): Payer: Self-pay | Admitting: Emergency Medicine

## 2016-06-01 ENCOUNTER — Emergency Department (HOSPITAL_COMMUNITY): Payer: Medicare Other

## 2016-06-01 ENCOUNTER — Ambulatory Visit: Payer: Medicare Other

## 2016-06-01 DIAGNOSIS — I10 Essential (primary) hypertension: Secondary | ICD-10-CM | POA: Diagnosis present

## 2016-06-01 DIAGNOSIS — L409 Psoriasis, unspecified: Secondary | ICD-10-CM | POA: Diagnosis present

## 2016-06-01 DIAGNOSIS — I482 Chronic atrial fibrillation: Secondary | ICD-10-CM | POA: Diagnosis not present

## 2016-06-01 DIAGNOSIS — Z923 Personal history of irradiation: Secondary | ICD-10-CM

## 2016-06-01 DIAGNOSIS — R0602 Shortness of breath: Secondary | ICD-10-CM

## 2016-06-01 DIAGNOSIS — E1165 Type 2 diabetes mellitus with hyperglycemia: Secondary | ICD-10-CM | POA: Diagnosis present

## 2016-06-01 DIAGNOSIS — Z7189 Other specified counseling: Secondary | ICD-10-CM | POA: Diagnosis not present

## 2016-06-01 DIAGNOSIS — J189 Pneumonia, unspecified organism: Secondary | ICD-10-CM | POA: Diagnosis not present

## 2016-06-01 DIAGNOSIS — C3432 Malignant neoplasm of lower lobe, left bronchus or lung: Secondary | ICD-10-CM | POA: Diagnosis not present

## 2016-06-01 DIAGNOSIS — Z8679 Personal history of other diseases of the circulatory system: Secondary | ICD-10-CM

## 2016-06-01 DIAGNOSIS — E785 Hyperlipidemia, unspecified: Secondary | ICD-10-CM | POA: Diagnosis present

## 2016-06-01 DIAGNOSIS — G4733 Obstructive sleep apnea (adult) (pediatric): Secondary | ICD-10-CM | POA: Diagnosis present

## 2016-06-01 DIAGNOSIS — J9601 Acute respiratory failure with hypoxia: Principal | ICD-10-CM | POA: Diagnosis present

## 2016-06-01 DIAGNOSIS — Z7901 Long term (current) use of anticoagulants: Secondary | ICD-10-CM | POA: Diagnosis not present

## 2016-06-01 DIAGNOSIS — I480 Paroxysmal atrial fibrillation: Secondary | ICD-10-CM | POA: Diagnosis present

## 2016-06-01 DIAGNOSIS — Z87891 Personal history of nicotine dependence: Secondary | ICD-10-CM | POA: Diagnosis not present

## 2016-06-01 DIAGNOSIS — Z515 Encounter for palliative care: Secondary | ICD-10-CM

## 2016-06-01 DIAGNOSIS — I2510 Atherosclerotic heart disease of native coronary artery without angina pectoris: Secondary | ICD-10-CM | POA: Diagnosis present

## 2016-06-01 DIAGNOSIS — K210 Gastro-esophageal reflux disease with esophagitis: Secondary | ICD-10-CM | POA: Diagnosis present

## 2016-06-01 DIAGNOSIS — I214 Non-ST elevation (NSTEMI) myocardial infarction: Secondary | ICD-10-CM | POA: Diagnosis present

## 2016-06-01 DIAGNOSIS — Z9221 Personal history of antineoplastic chemotherapy: Secondary | ICD-10-CM

## 2016-06-01 DIAGNOSIS — Y95 Nosocomial condition: Secondary | ICD-10-CM | POA: Diagnosis present

## 2016-06-01 DIAGNOSIS — J440 Chronic obstructive pulmonary disease with acute lower respiratory infection: Secondary | ICD-10-CM | POA: Diagnosis present

## 2016-06-01 DIAGNOSIS — Z79899 Other long term (current) drug therapy: Secondary | ICD-10-CM

## 2016-06-01 DIAGNOSIS — D649 Anemia, unspecified: Secondary | ICD-10-CM | POA: Diagnosis present

## 2016-06-01 DIAGNOSIS — I4891 Unspecified atrial fibrillation: Secondary | ICD-10-CM | POA: Diagnosis not present

## 2016-06-01 DIAGNOSIS — L8990 Pressure ulcer of unspecified site, unspecified stage: Secondary | ICD-10-CM | POA: Insufficient documentation

## 2016-06-01 DIAGNOSIS — K209 Esophagitis, unspecified: Secondary | ICD-10-CM | POA: Diagnosis not present

## 2016-06-01 DIAGNOSIS — C3490 Malignant neoplasm of unspecified part of unspecified bronchus or lung: Secondary | ICD-10-CM | POA: Diagnosis present

## 2016-06-01 DIAGNOSIS — E871 Hypo-osmolality and hyponatremia: Secondary | ICD-10-CM | POA: Diagnosis present

## 2016-06-01 DIAGNOSIS — Z85118 Personal history of other malignant neoplasm of bronchus and lung: Secondary | ICD-10-CM | POA: Diagnosis not present

## 2016-06-01 HISTORY — DX: Personal history of irradiation: Z92.3

## 2016-06-01 LAB — I-STAT CHEM 8, ED
BUN: 56 mg/dL — ABNORMAL HIGH (ref 6–20)
CREATININE: 1.1 mg/dL (ref 0.61–1.24)
Calcium, Ion: 1.18 mmol/L (ref 1.15–1.40)
Chloride: 96 mmol/L — ABNORMAL LOW (ref 101–111)
GLUCOSE: 194 mg/dL — AB (ref 65–99)
HCT: 41 % (ref 39.0–52.0)
HEMOGLOBIN: 13.9 g/dL (ref 13.0–17.0)
POTASSIUM: 4.8 mmol/L (ref 3.5–5.1)
Sodium: 130 mmol/L — ABNORMAL LOW (ref 135–145)
TCO2: 30 mmol/L (ref 0–100)

## 2016-06-01 LAB — URINALYSIS, ROUTINE W REFLEX MICROSCOPIC
BILIRUBIN URINE: NEGATIVE
GLUCOSE, UA: NEGATIVE mg/dL
Hgb urine dipstick: NEGATIVE
Ketones, ur: NEGATIVE mg/dL
Leukocytes, UA: NEGATIVE
Nitrite: NEGATIVE
PROTEIN: NEGATIVE mg/dL
SPECIFIC GRAVITY, URINE: 1.02 (ref 1.005–1.030)
pH: 5 (ref 5.0–8.0)

## 2016-06-01 LAB — I-STAT TROPONIN, ED: Troponin i, poc: 0.03 ng/mL (ref 0.00–0.08)

## 2016-06-01 LAB — I-STAT VENOUS BLOOD GAS, ED
ACID-BASE EXCESS: 3 mmol/L — AB (ref 0.0–2.0)
Bicarbonate: 26.3 mmol/L (ref 20.0–28.0)
O2 Saturation: 89 %
PH VEN: 7.491 — AB (ref 7.250–7.430)
TCO2: 27 mmol/L (ref 0–100)
pCO2, Ven: 34.4 mmHg — ABNORMAL LOW (ref 44.0–60.0)
pO2, Ven: 51 mmHg — ABNORMAL HIGH (ref 32.0–45.0)

## 2016-06-01 LAB — I-STAT CG4 LACTIC ACID, ED
Lactic Acid, Venous: 2.68 mmol/L (ref 0.5–1.9)
Lactic Acid, Venous: 2.5 mmol/L (ref 0.5–1.9)

## 2016-06-01 LAB — COMPREHENSIVE METABOLIC PANEL
ALBUMIN: 1.8 g/dL — AB (ref 3.5–5.0)
ALK PHOS: 131 U/L — AB (ref 38–126)
ALT: 20 U/L (ref 17–63)
ANION GAP: 12 (ref 5–15)
AST: 34 U/L (ref 15–41)
BUN: 45 mg/dL — ABNORMAL HIGH (ref 6–20)
CALCIUM: 9.3 mg/dL (ref 8.9–10.3)
CHLORIDE: 94 mmol/L — AB (ref 101–111)
CO2: 24 mmol/L (ref 22–32)
Creatinine, Ser: 1.16 mg/dL (ref 0.61–1.24)
GFR calc Af Amer: >60 mL/min (ref >60)
GFR calc non Af Amer: 59 mL/min — ABNORMAL LOW (ref >60)
GLUCOSE: 193 mg/dL — AB (ref 65–99)
Potassium: 5 mmol/L (ref 3.5–5.1)
SODIUM: 130 mmol/L — AB (ref 135–145)
Total Bilirubin: 3.6 mg/dL — ABNORMAL HIGH (ref 0.3–1.2)
Total Protein: 5.9 g/dL — ABNORMAL LOW (ref 6.5–8.1)

## 2016-06-01 LAB — CBC WITH DIFFERENTIAL/PLATELET
BASOS PCT: 0 %
Basophils Absolute: 0 10*3/uL (ref 0.0–0.1)
EOS ABS: 0 10*3/uL (ref 0.0–0.7)
EOS PCT: 0 %
HCT: 39.5 % (ref 39.0–52.0)
HEMOGLOBIN: 12.6 g/dL — AB (ref 13.0–17.0)
Lymphocytes Relative: 6 %
Lymphs Abs: 0.8 10*3/uL (ref 0.7–4.0)
MCH: 27 pg (ref 26.0–34.0)
MCHC: 31.9 g/dL (ref 30.0–36.0)
MCV: 84.6 fL (ref 78.0–100.0)
Monocytes Absolute: 0.6 10*3/uL (ref 0.1–1.0)
Monocytes Relative: 4 %
NEUTROS PCT: 90 %
Neutro Abs: 13.7 10*3/uL — ABNORMAL HIGH (ref 1.7–7.7)
PLATELETS: 220 10*3/uL (ref 150–400)
RBC: 4.67 MIL/uL (ref 4.22–5.81)
RDW: 20.1 % — ABNORMAL HIGH (ref 11.5–15.5)
WBC: 15.2 10*3/uL — AB (ref 4.0–10.5)

## 2016-06-01 LAB — BRAIN NATRIURETIC PEPTIDE: B Natriuretic Peptide: 518.2 pg/mL — ABNORMAL HIGH (ref 0.0–100.0)

## 2016-06-01 LAB — PROTIME-INR
INR: 1.7
PROTHROMBIN TIME: 20.2 s — AB (ref 11.4–15.2)

## 2016-06-01 LAB — GLUCOSE, CAPILLARY: Glucose-Capillary: 224 mg/dL — ABNORMAL HIGH (ref 65–99)

## 2016-06-01 LAB — TROPONIN I: TROPONIN I: 0.56 ng/mL — AB (ref <0.03)

## 2016-06-01 LAB — PHOSPHORUS: Phosphorus: 4.4 mg/dL (ref 2.5–4.6)

## 2016-06-01 LAB — MAGNESIUM
Magnesium: 2.3 mg/dL (ref 1.7–2.4)
Magnesium: 2.3 mg/dL (ref 1.7–2.4)

## 2016-06-01 LAB — TSH: TSH: 0.798 u[IU]/mL (ref 0.350–4.500)

## 2016-06-01 LAB — APTT: APTT: 36 s (ref 24–36)

## 2016-06-01 LAB — LACTIC ACID, PLASMA: Lactic Acid, Venous: 2 mmol/L (ref 0.5–1.9)

## 2016-06-01 LAB — T4, FREE: FREE T4: 0.79 ng/dL (ref 0.61–1.12)

## 2016-06-01 MED ORDER — IPRATROPIUM-ALBUTEROL 0.5-2.5 (3) MG/3ML IN SOLN: 3.0000 mL | RESPIRATORY_TRACT | Status: DC | PRN

## 2016-06-01 MED ORDER — HEPARIN (PORCINE) IN NACL 100-0.45 UNIT/ML-% IJ SOLN
1200.0000 [IU]/h | INTRAMUSCULAR | Status: DC
  Administered 2016-06-01: 1200 [IU]/h via INTRAVENOUS
  Filled 2016-06-01 (×2): qty 250

## 2016-06-01 MED ORDER — IPRATROPIUM BROMIDE 0.02 % IN SOLN
0.5000 mg | Freq: Four times a day (QID) | RESPIRATORY_TRACT | Status: DC
  Administered 2016-06-02: 0.5 mg via RESPIRATORY_TRACT
  Filled 2016-06-01 (×2): qty 2.5

## 2016-06-01 MED ORDER — AMIODARONE HCL IN DEXTROSE 360-4.14 MG/200ML-% IV SOLN
60.0000 mg/h | INTRAVENOUS | Status: AC
  Administered 2016-06-01 (×2): 60 mg/h via INTRAVENOUS
  Filled 2016-06-01 (×2): qty 200

## 2016-06-01 MED ORDER — METOPROLOL TARTRATE 5 MG/5ML IV SOLN
5.0000 mg | Freq: Once | INTRAVENOUS | Status: AC
  Administered 2016-06-01: 5 mg via INTRAVENOUS

## 2016-06-01 MED ORDER — SODIUM CHLORIDE 0.9 % IV BOLUS (SEPSIS)
1000.0000 mL | Freq: Once | INTRAVENOUS | Status: AC
  Administered 2016-06-01: 1000 mL via INTRAVENOUS

## 2016-06-01 MED ORDER — AMIODARONE LOAD VIA INFUSION
150.0000 mg | Freq: Once | INTRAVENOUS | Status: AC
  Administered 2016-06-01: 150 mg via INTRAVENOUS
  Filled 2016-06-01: qty 83.34

## 2016-06-01 MED ORDER — INSULIN ASPART 100 UNIT/ML ~~LOC~~ SOLN
0.0000 [IU] | Freq: Three times a day (TID) | SUBCUTANEOUS | Status: DC
Start: 2016-06-02 — End: 2016-06-02
  Administered 2016-06-02 (×2): 3 [IU] via SUBCUTANEOUS

## 2016-06-01 MED ORDER — AMIODARONE HCL IN DEXTROSE 360-4.14 MG/200ML-% IV SOLN
30.0000 mg/h | INTRAVENOUS | Status: DC
  Administered 2016-06-02 (×2): 30 mg/h via INTRAVENOUS
  Filled 2016-06-01 (×3): qty 200

## 2016-06-01 MED ORDER — PANTOPRAZOLE SODIUM 40 MG PO TBEC: 40.0000 mg | DELAYED_RELEASE_TABLET | Freq: Every day | ORAL | Status: DC

## 2016-06-01 MED ORDER — VANCOMYCIN HCL IN DEXTROSE 1-5 GM/200ML-% IV SOLN: 1000.0000 mg | Freq: Once | INTRAVENOUS | Status: DC

## 2016-06-01 MED ORDER — LEVALBUTEROL HCL 0.63 MG/3ML IN NEBU
0.6300 mg | INHALATION_SOLUTION | RESPIRATORY_TRACT | Status: DC | PRN
  Filled 2016-06-01: qty 3

## 2016-06-01 MED ORDER — SODIUM CHLORIDE 0.9 % IV SOLN: 250.0000 mL | INTRAVENOUS | Status: DC | PRN

## 2016-06-01 MED ORDER — SUCRALFATE 1 GM/10ML PO SUSP
1.0000 g | Freq: Three times a day (TID) | ORAL | Status: DC
  Administered 2016-06-01 – 2016-06-02 (×2): 1 g via ORAL
  Filled 2016-06-01 (×5): qty 10

## 2016-06-01 MED ORDER — DEXTROSE 5 % IV SOLN
1.0000 g | Freq: Three times a day (TID) | INTRAVENOUS | Status: DC
  Administered 2016-06-02: 1 g via INTRAVENOUS
  Filled 2016-06-01 (×3): qty 1

## 2016-06-01 MED ORDER — VANCOMYCIN HCL 10 G IV SOLR
2000.0000 mg | Freq: Once | INTRAVENOUS | Status: AC
  Administered 2016-06-01: 2000 mg via INTRAVENOUS
  Filled 2016-06-01: qty 2000

## 2016-06-01 MED ORDER — SODIUM CHLORIDE 0.9 % IV SOLN
INTRAVENOUS | Status: DC
  Administered 2016-06-01: 75 mL/h via INTRAVENOUS
  Administered 2016-06-02: 12:00:00 via INTRAVENOUS

## 2016-06-01 MED ORDER — AMIODARONE LOAD VIA INFUSION: 150.0000 mg | Freq: Once | INTRAVENOUS | Status: DC

## 2016-06-01 MED ORDER — ESMOLOL HCL-SODIUM CHLORIDE 2000 MG/100ML IV SOLN
25.0000 ug/kg/min | Freq: Once | INTRAVENOUS | Status: AC
  Administered 2016-06-01: 25 ug/kg/min via INTRAVENOUS
  Filled 2016-06-01: qty 100

## 2016-06-01 MED ORDER — LEVALBUTEROL HCL 1.25 MG/0.5ML IN NEBU
1.2500 mg | INHALATION_SOLUTION | Freq: Once | RESPIRATORY_TRACT | Status: AC
  Administered 2016-06-01: 1.25 mg via RESPIRATORY_TRACT
  Filled 2016-06-01: qty 0.5

## 2016-06-01 MED ORDER — METOPROLOL TARTRATE 5 MG/5ML IV SOLN
INTRAVENOUS | Status: AC
  Administered 2016-06-01: 5 mg via INTRAVENOUS
  Filled 2016-06-01: qty 5

## 2016-06-01 MED ORDER — ROSUVASTATIN CALCIUM 10 MG PO TABS
10.0000 mg | ORAL_TABLET | Freq: Every day | ORAL | Status: DC
  Filled 2016-06-01: qty 1

## 2016-06-01 MED ORDER — VANCOMYCIN HCL IN DEXTROSE 750-5 MG/150ML-% IV SOLN
750.0000 mg | Freq: Two times a day (BID) | INTRAVENOUS | Status: DC
  Administered 2016-06-02: 750 mg via INTRAVENOUS
  Filled 2016-06-01 (×2): qty 150

## 2016-06-01 MED ORDER — CLOPIDOGREL BISULFATE 75 MG PO TABS
75.0000 mg | ORAL_TABLET | Freq: Every day | ORAL | Status: DC
  Filled 2016-06-01: qty 1

## 2016-06-01 MED ORDER — HEPARIN BOLUS VIA INFUSION
4000.0000 [IU] | Freq: Once | INTRAVENOUS | Status: AC
  Administered 2016-06-01: 4000 [IU] via INTRAVENOUS
  Filled 2016-06-01: qty 4000

## 2016-06-01 MED ORDER — INSULIN ASPART 100 UNIT/ML ~~LOC~~ SOLN
0.0000 [IU] | Freq: Every day | SUBCUTANEOUS | Status: DC
  Administered 2016-06-01: 2 [IU] via SUBCUTANEOUS

## 2016-06-01 MED ORDER — DEXTROSE 5 % IV SOLN
2.0000 g | Freq: Once | INTRAVENOUS | Status: AC
  Administered 2016-06-01: 2 g via INTRAVENOUS
  Filled 2016-06-01: qty 2

## 2016-06-01 NOTE — Telephone Encounter (Addendum)
Patient's wife, Sonia Baller, called and asked if she can get a hospice nurse referral.  She said Rein is not doing well.  He has not eaten for 2 weeks.  She said he can swallow OK but nothing tastes right.  She also said he is falling a lot and is not talking very much.  He has been coughing up blood.  She said he does not have a temperature.  Advised her that he should go to the ER to be evaluated or that he can come in to see Dr. Sondra Come today.  She said he had an appointment for infusion today and he is refusing to go.  She is not able to make him come to the hospital or to see a doctor.  She wants the hospice referral to have a nurse come to the house to convince him to go to the hospital.  Advised her that I will check with Dr. Worthy Flank office and call back.  Mal Amabile, RN with Dr. Worthy Flank office who will also call patient's wife.

## 2016-06-01 NOTE — ED Notes (Signed)
Lactic acid result given to Dr. Ralene Bathe

## 2016-06-01 NOTE — Progress Notes (Signed)
ANTICOAGULATION CONSULT NOTE - Initial Consult  Pharmacy Consult for heparin Indication: atrial fibrillation  Allergies  Allergen Reactions  . Gadolinium Derivatives Hives, Shortness Of Breath, Itching, Swelling and Cough    Pt had severe hives and whelps with swelling around eyes and facial swelling, sob and wheezing  immed post gad inj        Patient Measurements: Height: 5\' 11"  (180.3 cm) Weight: 182 lb 12.2 oz (82.9 kg) IBW/kg (Calculated) : 75.3 Heparin Dosing Weight: 82.9kg  Vital Signs: Temp: 97.5 F (36.4 C) (05/29 1557) Temp Source: Oral (05/29 1557) BP: 93/70 (05/29 1820) Pulse Rate: 158 (05/29 1820)  Labs:  Recent Labs  06/01/16 1613 06/01/16 1629  HGB 12.6* 13.9  HCT 39.5 41.0  PLT 220  --   APTT 36  --   LABPROT 20.2*  --   INR 1.70  --   CREATININE 1.16 1.10    Estimated Creatinine Clearance: 59.9 mL/min (by C-G formula based on SCr of 1.1 mg/dL).   Medical History: Past Medical History:  Diagnosis Date  . AAA (abdominal aortic aneurysm) ([place])   . Anemia   . CAD (coronary artery disease)    s/p RCA atherectomy 1994, cath 1999 with occluded RCA with left to right collaterals, PCI of LAD 2007, s/p PCI of LAD for instent thrombosis 2008, PCI 08/2015 with DES to LAD due to ISR with known L-R collaterals to RCA  . COPD (chronic obstructive pulmonary disease) ([place])   . Diabetes mellitus without complication (H[facility])    diet controlled   . Dyslipidemia   . Hyperlipidemia   . Hypertension   . Insomnia   . Lung cancer [facility])    s/p lobectomy, chemo and radiation therapy  . OSA (obstructive sleep apnea)    intolerant to CPAP  . PAF (paroxysmal atrial fibrillation) ([place])   . Psoriasis   . Squamous cell carcinoma of lung, stage III, left ([place]) 03/18/2016  . Tobacco abuse    Assessment: 76yo M with h/o afib but stopped taking PTA Xarelto (last dose 5/15). Pharmacy consulted to dose heparin. Hgb 13.9, Plt wnl, no bleeding noted  Goal of Therapy:  Heparin  level 0.3-0.7 units/ml Monitor platelets by anticoagulation protocol: Yes   Plan:  Give 4000 units bolus x 1 Start heparin infusion at 1200 units/hr Check anti-Xa level in 8 hours and daily while on heparin Continue to monitor H&H and platelets    Gwenlyn Perking, PharmD PGY1 Pharmacy Resident Rx ED [phone] 06/01/2016 7:53 PM

## 2016-06-01 NOTE — Progress Notes (Signed)
CRITICAL VALUE ALERT  Critical Value:  Lactic 2.0  & Troponin 0.57  Date & Time Notied:  06/01/16 2319  Provider Notified: Warren Lacy  Orders Received/Actions taken: None at this time

## 2016-06-01 NOTE — ED Notes (Signed)
I-stat lactic acid result given to Dr. Ralene Bathe

## 2016-06-01 NOTE — H&P (Signed)
PULMONARY / CRITICAL CARE MEDICINE   Name: Travis [name] MRN: [id number] DOB: March 03, 1940    ADMISSION DATE:  06/01/2016 CONSULTATION DATE:  06/01/2016  REFERRING MD:  Dr. 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 He was recently admitted overnight back in the beginning of May for atrial fibrillation. He had not been able to take his medications due to what is suspected to be some radiation-related esophagitis. He was placed on amiodarone which enabled him to convert back into normal sinus. He was discharged day later after being instructed that he can crush his medications. He has since been seen by his oncologist to plan start new therapy which consists of carboplatin and paclitaxel. This was scheduled to start 5/29, but before he could go, 5/29 he presented to [facility] ED with complaints of shortness of breath x 2 days with associated productive cough and wheeze. He was noted to have WBC elevation and Left basilar infiltrate on CXR so he was started on broad spectrum antibiotics. Also found to be in AF with RVR and started on esmolol infusion. He became hypotensive and PCCM has been asked to admit.      PAST MEDICAL HISTORY :  He  has a past medical history of AAA (abdominal aortic aneurysm) ([place]); Anemia; CAD (coronary artery disease); COPD (chronic obstructive pulmonary disease) ([place]); Diabetes mellitus without complication ([place]); Dyslipidemia; Hyperlipidemia; Hypertension; Insomnia; Lung cancer ([place]); OSA (obstructive sleep apnea); PAF (paroxysmal atrial fibrillation) ([place]); Psoriasis; Squamous cell carcinoma of lung,  stage III, left ([place]) (03/18/2016); and Tobacco abuse.  PAST SURGICAL HISTORY: He  has a past surgical history that includes Cardiac catheterization (1999); s/p lung lobectomy (2007); multiple PCT's to RCA and LAD (2007/2008); Exploratory laparotomy; Appendectomy; Video bronchoscopy (N/A, 08/28/2012); Cardiac catheterization (N/A, 08/22/2015); Cardiac catheterization (N/A, 08/22/2015); and Video bronchoscopy (Bilateral, 03/11/2016).  Allergies  Allergen Reactions  . Gadolinium Derivatives Hives, Shortness Of Breath, Itching, Swelling and Cough    Pt had severe hives and whelps with swelling around eyes and facial swelling, sob and wheezing  immed post gad inj        No current facility-administered medications on file prior to encounter.    Current Outpatient Prescriptions on File Prior to Encounter  Medication Sig  . acetaminophen (TYLENOL) 325 MG tablet Take 2 tablets (650 mg total) by mouth every 4 (four) hours as needed for headache or mild pain.  Marland Kitchen albuterol (PROAIR HFA) 108 (90 Base) MCG/ACT inhaler Inhale 2 puffs into the lungs every 4 (four) hours as needed for wheezing or shortness of breath.   . clopidogrel (PLAVIX) 75 MG tablet Take 1 tablet (75 mg total) by mouth daily with breakfast.  . ferrous sulfate 325 (65 FE) MG tablet Take 325 mg by mouth at bedtime.   Marland Kitchen HYDROcodone-homatropine (HYCODAN) 5-1.5 MG/5ML syrup Take 5 mLs by mouth every 6 (six) hours as needed for cough.  . isosorbide mononitrate (IMDUR) 30 MG 24 hr tablet Take 1 tablet (30 mg total) by mouth daily.  . MULTAQ 400 MG tablet TAKE 1 TABLET BY MOUTH TWICE A DAY 30  . nitroGLYCERIN (NITROSTAT) 0.4 MG SL tablet Place 0.4 mg under the tongue every 5 (five) minutes  as needed for chest pain. X 3 doses  . nystatin (MYCOSTATIN) 100000 UNIT/ML suspension 5-10 ml every 6 hours as needed. (Swish and swallow)  . Tiotropium Bromide-Olodaterol (STIOLTO RESPIMAT) 2.5-2.5 MCG/ACT AERS Inhale 2 puffs into the lungs daily.  . traZODone  (DESYREL) 100 MG tablet Take 200 mg by mouth at bedtime.   Alveda Reasons 20 MG TABS tablet Take 20 mg by mouth daily.  . magic mouthwash w/lidocaine SOLN Take 5 mLs by mouth every 4 (four) hours as needed for mouth pain. (Patient not taking: Reported on 05/28/2016)  . methylPREDNISolone (MEDROL DOSEPAK) 4 MG TBPK tablet Use as instructed (Patient not taking: Reported on 06/01/2016)  . ondansetron (ZOFRAN) 8 MG tablet Take 1 tablet (8 mg total) by mouth every 8 (eight) hours as needed for nausea or vomiting. (Patient not taking: Reported on 05/07/2016)  . oxyCODONE-acetaminophen (PERCOCET/ROXICET) 5-325 MG tablet Take 1 tablet by mouth every 4 (four) hours as needed for severe pain. (Patient not taking: Reported on 05/28/2016)  . pantoprazole (PROTONIX) 40 MG tablet Take 1 tablet (40 mg total) by mouth daily.  . rosuvastatin (CRESTOR) 10 MG tablet Take 1 tablet (10 mg total) by mouth at bedtime.  . sucralfate (CARAFATE) 1 GM/10ML suspension Take 10 mLs (1 g total) by mouth 4 (four) times daily -  with meals and at bedtime. (Patient not taking: Reported on 05/28/2016)    FAMILY HISTORY:  His indicated that his mother is deceased. He indicated that his father is deceased. He indicated that the status of his neg hx is unknown.    SOCIAL HISTORY: He  reports that he has quit smoking. His smoking use included Cigarettes. He has a 14.50 pack-year smoking history. His smokeless tobacco use includes Snuff. He reports that he does not drink alcohol or use drugs.  REVIEW OF SYSTEMS:   All negative; except for those that are bolded, which indicate positives.  Constitutional: weight loss, weight gain, night sweats, fevers, chills, fatigue, weakness.  HEENT: headaches, sore throat, sneezing, nasal congestion, post nasal drip, difficulty swallowing, tooth/dental problems, visual complaints, visual changes, ear aches. Neuro: difficulty with speech, weakness, numbness, ataxia. CV:  chest pain, orthopnea, PND, swelling  in lower extremities, dizziness, palpitations, syncope.  Resp: cough, hemoptysis, dyspnea, wheezing. GI: heartburn, indigestion, abdominal pain, nausea, vomiting, diarrhea, constipation, change in bowel habits, loss of appetite, hematemesis, melena, hematochezia.  GU: dysuria, change in color of urine, urgency or frequency, flank pain, hematuria. MSK: joint pain or swelling, decreased range of motion. Psych: change in mood or affect, depression, anxiety, suicidal ideations, homicidal ideations. Skin: rash, itching, bruising.   SUBJECTIVE:  Denies chest pain.  Has productive cough of white colored sputum.  VITAL SIGNS: BP 93/70   Pulse (!) 158   Temp 97.5 F (36.4 C) (Oral)   Resp (!) 22   SpO2 98%   HEMODYNAMICS:    VENTILATOR SETTINGS:    INTAKE / OUTPUT: No intake/output data recorded.  PHYSICAL EXAMINATION: General:  Adult male, in NAD. Neuro:  A&O x 3, no deficits. HEENT:  [place]/AT, MMM. Cardiovascular:  Tachy, IRIR, No M/R/G. Lungs:  Coarse bilateral bases with faint wheeze. Abdomen:  BS x 4, S/NT/ND. Musculoskeletal:  No deformities, no edema. Skin:  [place], dry.  LABS:  BMET  Recent Labs Lab 05/28/16 0903 06/01/16 1613 06/01/16 1629  NA 132* 130* 130*  K 4.6 5.0 4.8  CL  --  94* 96*  CO2 25 24  --   BUN 40.8* 45* 56*  CREATININE  1.3 1.16 1.10  GLUCOSE 225* 193* 194*    Electrolytes  Recent Labs Lab 05/28/16 0903 06/01/16 1613  CALCIUM 10.0 9.3    CBC  Recent Labs Lab 05/28/16 0903 06/01/16 1613 06/01/16 1629  WBC 20.0* 15.2*  --   HGB 11.6* 12.6* 13.9  HCT 36.8* 39.5 41.0  PLT 260 220  --     Coag's  Recent Labs Lab 06/01/16 1613  APTT 36  INR 1.70    Sepsis Markers  Recent Labs Lab 06/01/16 1630  LATICACIDVEN 2.68*    ABG No results for input(s): PHART, PCO2ART, PO2ART in the last 168 hours.  Liver Enzymes  Recent Labs Lab 05/28/16 0903 06/01/16 1613  AST 23 34  ALT 29 20  ALKPHOS 162* 131*  BILITOT 2.30*  3.6*  ALBUMIN 2.0* 1.8*    Cardiac Enzymes No results for input(s): TROPONINI, PROBNP in the last 168 hours.  Glucose No results for input(s): GLUCAP in the last 168 hours.  Imaging Dg Chest Port 1 View  Result Date: 06/01/2016 CLINICAL DATA:  Lung cancer, shortness of breath for few weeks, hypertension, coronary disease, paroxysmal atrial fibrillation EXAM: PORTABLE CHEST 1 VIEW COMPARISON:  Portable exam 1640 hours compared to 05/10/2016 FINDINGS: Enlargement of cardiac silhouette. Mediastinal contours and pulmonary vascularity normal. Seed implants are seen at the LEFT mid lung corresponding to a cavitary neoplasm as noted on an earlier PET-CT. Mild central peribronchial thickening. Infiltrate identified at LEFT lung base which could represent pneumonia or postobstructive pneumonitis. Remaining lungs clear. No pleural effusion or pneumothorax. 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 LEFT basilar infiltrate. Mild enlargement of cardiac silhouette. Electronically Signed   By: Lavonia Dana M.D.   On: 06/01/2016 16:55     STUDIES:  CXR 5/29 > cavitary left perihilar mass with seed implants, left basilar infiltrate.  CULTURES: Blood 5/29 >  Sputum 5/29 >   ANTIBIOTICS: Vanc 5/29 >  Cefepime 5/29 >   SIGNIFICANT EVENTS: 5/29 > admit.  LINES/TUBES: None.  DISCUSSION: 76 y.o. M with Stage IV non-small cell lung cancer squamous cell (s/p chemoradiation and now undergoing chemotherapy, first dose was scheduled 06/01/16), brought to [facility] ED 5/29 with SOB and fatigue.  Found to have AF RVR and possible HCAP.  ASSESSMENT / PLAN:  PULMONARY A: Stage IV NSLC - s/p chemoradiation and currently planned to start chemo (first dose was scheduled 5/29). Possible HCAP. COPD with possible exacerbation. Former smoker. P:   Day team to please notify oncology of admission. Abx per ID section. Levalbuterol PRN / Ipratropium scheduled. Pulmonary hygiene. CXR in  AM.  CARDIOVASCULAR A:  AF RVR - had recent episode earlier this month requiring admission and converted to NSR after amiodarone. Hypotension - after esmolol started. Hx HTN, HLD, CAD, AAA. P:  Discussed with Dr. Marlou Porch of cardiology who agrees to start back amiodarone and assess response.  If no response, then call cardiology back for further recs. D/c esmolol. Assess TSH, Free T4. Trend troponin, lactate. Heparin gtt in lieu of preadmission xarelto. Continue preadmission plavix, rosuvastatin. Hold preadmission imdur, nitro, xarelto.  RENAL A:   Hyponatremia - likely from decreased PO intake. P:   NS @ 75. Correct electrolytes as indicated. BMP in AM.  GASTROINTESTINAL A:   Nutrition. P:   Heart Healthy diet.  HEMATOLOGIC / ONCOLOGIC A:   Stage IV NSLC - s/p chemoradiation and currently planned to start chemo (first dose was scheduled 5/29). VTE prophylaxis. P:  Day team to please notify oncology  of admission. Heparin gtt / SCD's. CBC in AM.  INFECTIOUS A:   Possible HCAP. COPD with possible exacerbation. P:   Continue abx as above (vanc / cefepime). Follow cultures.  ENDOCRINE A:   DM - not on meds. P:   SSI. Assess Hgb A1c.  NEUROLOGIC A:   Failure to thrive. P:   Palliative care consult. Hold preadmission percocet.   FAMILY  - Updates: None available.  - Inter-disciplinary family meet or Palliative Care meeting due by:  06/07/16.  CC time: 30 min.   Montey Hora, [facility] Pager: [phone]  or [phone] 06/01/2016, 7:41 PM

## 2016-06-01 NOTE — ED Triage Notes (Signed)
Per [facility],  Pt from home. Pt has hx of lung cancer, recieves tx at [facility]. Pt reports SHOB for past few weeks. Pt has not been taking his medications for the past few weeks. Pt states, "I'm tired of feeling bad and not being able to breathe." Pt alert and oriented able to answer questions. Pt has expiratory wheezing and crackles in bilateral lung fields. Pt wants more information on advanced directives.

## 2016-06-01 NOTE — ED Notes (Signed)
Attempted to contact CC regarding medications. Holding Amiodarone

## 2016-06-01 NOTE — Progress Notes (Signed)
Pharmacy Antibiotic Note  Travis [name] is a 76 y.o. male admitted on 06/01/2016 with pneumonia.  Pharmacy has been consulted for vancomycin and cefepime dosing. Patient is afebrile, WBC 15.2, CrCl ~60 ml/min  Plan: Vancomycin 2000mg  x1 then 750mg  IV every 12 hours.  Goal trough 15-20 mcg/mL. Cefepime 2g x1 then 1g IV Q8H Monitor renal function, cultures, vanc trough at steady state    Temp (24hrs), Avg:97.5 F (36.4 C), Min:97.5 F (36.4 C), Max:97.5 F (36.4 C)   Recent Labs Lab 05/28/16 0903 06/01/16 1613 06/01/16 1629 06/01/16 1630  WBC 20.0* 15.2*  --   --   CREATININE 1.3  --  1.10  --   LATICACIDVEN  --   --   --  2.68*    Estimated Creatinine Clearance: 59.9 mL/min (by C-G formula based on SCr of 1.1 mg/dL).    Allergies  Allergen Reactions  . Gadolinium Derivatives Hives, Shortness Of Breath, Itching, Swelling and Cough    Pt had severe hives and whelps with swelling around eyes and facial swelling, sob and wheezing  immed post gad inj        Antimicrobials this admission: 5/29 vanc >>  5/29 cefepime >>   Dose adjustments this admission: n/a  Microbiology results: 5/29 BCx:   Thank you for allowing pharmacy to be a part of this patient's care.  Gwenlyn Perking, PharmD PGY1 Pharmacy Resident Rx ED [phone] 06/01/2016 5:38 PM

## 2016-06-01 NOTE — Progress Notes (Signed)
[place] Progress Note Patient Name: Travis [name] DOB: 1940/03/20 MRN: [id number]   Date of Service  06/01/2016  HPI/Events of Note  Plan of care discussed with 82 assistant and bedside emergency department nurse. Patient planned to transition from asthma wall to amiodarone infusion.   eICU Interventions  Plan of care clarified with bedside nurse.      Intervention Category Major Interventions: Arrhythmia - evaluation and management  Tera Partridge 06/01/2016, 8:28 PM

## 2016-06-01 NOTE — ED Provider Notes (Signed)
[place] DEPT Provider Note   CSN: [id number] Arrival date & time: 06/01/16  1548     History   Chief Complaint Chief Complaint  Patient presents with  . Shortness of Breath    HPI Travis [name] is a 76 y.o. male.  The history is provided by the patient.  Shortness of Breath  This is a chronic problem. The average episode lasts 2 days. The problem occurs continuously.The current episode started 2 days ago. The problem has been gradually worsening. Associated symptoms include cough, sputum production and wheezing. Pertinent negatives include no fever, no chest pain and no syncope. Precipitated by: History of lung cancer status post chemotherapy on Tuesday. He has tried nothing for the symptoms. The treatment provided no relief. He has had prior hospitalizations.    Past Medical History:  Diagnosis Date  . AAA (abdominal aortic aneurysm) ([place])   . Anemia   . CAD (coronary artery disease)    s/p RCA atherectomy 1994, cath 1999 with occluded RCA with left to right collaterals, PCI of LAD 2007, s/p PCI of LAD for instent thrombosis 2008, PCI 08/2015 with DES to LAD due to ISR with known L-R collaterals to RCA  . COPD (chronic obstructive pulmonary disease) ([place])   . Diabetes mellitus without complication (HCC)    diet controlled   . Dyslipidemia   . Hyperlipidemia   . Hypertension   . Insomnia   . Lung cancer [facility])    s/p lobectomy, chemo and radiation therapy  . OSA (obstructive sleep apnea)    intolerant to CPAP  . PAF (paroxysmal atrial fibrillation) ([place])   . Psoriasis   . 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 Tobacco abuse     Patient Active Problem List   Diagnosis Date Noted  . HCAP (healthcare-associated pneumonia) 06/01/2016  . Atrial fibrillation with RVR ([place]) 05/10/2016  . Fall 04/19/2016  . 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 Goals of care, counseling/discussion 03/18/2016  . Encounter  for antineoplastic chemotherapy 03/18/2016  . Mass of left lung 03/04/2016  . Acute pulmonary embolism ([place]) 03/02/2016  . Cough 02/26/2016  . AAA (abdominal aortic aneurysm) ([place]) 11/04/2015  . Iron deficiency anemia 10/25/2015  . Shortness of breath 10/17/2015  . Chronic anticoagulation-Xarelto 08/23/2015  . Angina, class III ([place]) 08/22/2015  . Abnormal nuclear stress test 08/13/15  08/22/2015  . Morbid obesity ([place]) 11/21/2014  . AAA family hx 11/20/2014  . Absent pulse in lower extremity 11/20/2014  . Encounter for smoking cessation counseling 11/20/2014  . CAP (community acquired pneumonia) 05/14/2013  . CAD S/P multiple PCIs   . COPD GOLD II  09/16/2010  . Cigarette smoker 09/16/2010  . PAF (paroxysmal atrial fibrillation) ([place])   . Essential hypertension   . Dyslipidemia   . OSA (obstructive sleep apnea)   . Lung cancer [facility])     Past Surgical History:  Procedure Laterality Date  . APPENDECTOMY    . CARDIAC CATHETERIZATION  1999  . CARDIAC CATHETERIZATION N/A 08/22/2015   Procedure: Left Heart Cath and Coronary Angiography;  Surgeon: Leonie Man, MD;  Location: [place] CV LAB;  Service: Cardiovascular;  Laterality: N/A;  . CARDIAC CATHETERIZATION N/A 08/22/2015   Procedure: Coronary Stent Intervention;  Surgeon: Leonie Man, MD;  Location: [place] CV LAB;  Service: Cardiovascular;  Laterality: N/A;  . EXPLORATORY LAPAROTOMY     secondary to trauma  . multiple PCT's to RCA and  LAD  2007/2008  . s/p lung lobectomy  2007  . VIDEO BRONCHOSCOPY N/A 08/28/2012   Procedure: VIDEO BRONCHOSCOPY WITHOUT FLUORO;  Surgeon: Elsie Stain, MD;  Location: [facility];  Service: Cardiopulmonary;  Laterality: N/A;  . VIDEO BRONCHOSCOPY Bilateral 03/11/2016   Procedure: VIDEO BRONCHOSCOPY WITHOUT FLUORO;  Surgeon: Tanda Rockers, MD;  Location: [facility];  Service: Cardiopulmonary;  Laterality: Bilateral;       Home Medications    Prior to Admission medications     Medication Sig Start Date End Date Taking? Authorizing Provider  acetaminophen (TYLENOL) 325 MG tablet Take 2 tablets (650 mg total) by mouth every 4 (four) hours as needed for headache or mild pain. 05/11/16  Yes Kilroy, Luke K, PA-C  albuterol (PROAIR HFA) 108 (90 Base) MCG/ACT inhaler Inhale 2 puffs into the lungs every 4 (four) hours as needed for wheezing or shortness of breath.    Yes [provider]  clopidogrel (PLAVIX) 75 MG tablet Take 1 tablet (75 mg total) by mouth daily with breakfast. 09/02/15  Yes Dunn, Dayna N, PA-C  ferrous sulfate 325 (65 FE) MG tablet Take 325 mg by mouth at bedtime.    Yes [provider]  HYDROcodone-homatropine (HYCODAN) 5-1.5 MG/5ML syrup Take 5 mLs by mouth every 6 (six) hours as needed for cough. 05/28/16  Yes Curt Bears, MD  isosorbide mononitrate (IMDUR) 30 MG 24 hr tablet Take 1 tablet (30 mg total) by mouth daily. 05/11/16 08/09/16 Yes Kilroy, Luke K, PA-C  MULTAQ 400 MG tablet TAKE 1 TABLET BY MOUTH TWICE A DAY 30 10/06/15  Yes Turner, Traci R, MD  nitroGLYCERIN (NITROSTAT) 0.4 MG SL tablet Place 0.4 mg under the tongue every 5 (five) minutes as needed for chest pain. X 3 doses   Yes [provider]  nystatin (MYCOSTATIN) 100000 UNIT/ML suspension 5-10 ml every 6 hours as needed. (Swish and swallow) 05/28/16 06/04/16 Yes Truitt Merle, MD  Tiotropium Bromide-Olodaterol (STIOLTO RESPIMAT) 2.5-2.5 MCG/ACT AERS Inhale 2 puffs into the lungs daily. 02/25/16  Yes Tanda Rockers, MD  traZODone (DESYREL) 100 MG tablet Take 200 mg by mouth at bedtime.    Yes [provider]  XARELTO 20 MG TABS tablet Take 20 mg by mouth daily. 02/17/16  Yes [provider]  magic mouthwash w/lidocaine SOLN Take 5 mLs by mouth every 4 (four) hours as needed for mouth pain. Patient not taking: Reported on 05/28/2016 05/07/16   Kyung Rudd, MD  methylPREDNISolone (MEDROL DOSEPAK) 4 MG TBPK tablet Use as instructed Patient not taking: Reported on  06/01/2016 05/28/16   Curt Bears, MD  ondansetron (ZOFRAN) 8 MG tablet Take 1 tablet (8 mg total) by mouth every 8 (eight) hours as needed for nausea or vomiting. Patient not taking: Reported on 05/07/2016 03/23/16   Curt Bears, MD  oxyCODONE-acetaminophen (PERCOCET/ROXICET) 5-325 MG tablet Take 1 tablet by mouth every 4 (four) hours as needed for severe pain. Patient not taking: Reported on 05/28/2016 05/04/16   Gery Pray, MD  pantoprazole (PROTONIX) 40 MG tablet Take 1 tablet (40 mg total) by mouth daily. 09/02/15   Dunn, Nedra Hai, PA-C  rosuvastatin (CRESTOR) 10 MG tablet Take 1 tablet (10 mg total) by mouth at bedtime. 05/11/16 08/09/16  Erlene Quan, PA-C  sucralfate (CARAFATE) 1 GM/10ML suspension Take 10 mLs (1 g total) by mouth 4 (four) times daily -  with meals and at bedtime. Patient not taking: Reported on 05/28/2016 04/28/16   Gery Pray, MD  Family History Family History  Problem Relation Age of Onset  . Heart attack Father   . Heart disease Mother   . Heart attack Mother   . Coronary artery disease Brother   . Lymphoma Brother   . Breast cancer Sister   . Diabetes Sister   . Heart disease Sister   . Colon cancer Neg Hx   . Stomach cancer Neg Hx     Social History Social History  Substance Use Topics  . Smoking status: Former Smoker    Packs/day: 0.25    Years: 58.00    Types: Cigarettes  . Smokeless tobacco: Current User    Types: Snuff  . Alcohol use No     Allergies   Gadolinium derivatives   Review of Systems Review of Systems  Constitutional: Negative for fever.  HENT: Negative.   Respiratory: Positive for cough, sputum production, shortness of breath and wheezing.   Cardiovascular: Negative for chest pain and syncope.  Gastrointestinal: Negative for abdominal distention.  Genitourinary: Negative.   Musculoskeletal: Negative.   Skin: Negative.   All other systems reviewed and are negative.    Physical Exam Updated Vital Signs BP  104/83 (BP Location: Right Arm)   Pulse (!) 121   Temp 97.5 F (36.4 C) (Axillary)   Resp 19   Ht 6' (1.829 m)   Wt 82.8 kg (182 lb 8.7 oz)   SpO2 98%   BMI 24.76 kg/m  ED Triage Vitals [06/01/16 1557]  Enc Vitals Group     BP      Pulse Rate (!) 170     Resp 18     Temp 97.5 F (36.4 C)     Temp Source Oral     SpO2 94 %     Weight      Height      Head Circumference      Peak Flow      Pain Score      Pain Loc      Pain Edu?      Excl. in [place]?     Physical Exam  Constitutional: He is oriented to person, place, and time.  Chronically ill-appearing male  HENT:  Head: Normocephalic and atraumatic.  Mouth/Throat: Oropharynx is clear and moist.  Eyes: Conjunctivae are normal. Pupils are equal, round, and reactive to light.  Neck: Normal range of motion. Neck supple.  Cardiovascular:  No murmur heard. Tachycardic with irregularly irregular rhythm  Pulmonary/Chest: He exhibits no tenderness.  Diffuse coarse breath sounds throughout  Abdominal: Soft. There is no tenderness. There is no guarding.  Musculoskeletal: He exhibits no edema or tenderness.  Neurological: He is alert and oriented to person, place, and time. No cranial nerve deficit. Coordination normal.  Skin: Skin is warm and dry.  Psychiatric: His speech is delayed. He is slowed and withdrawn. He exhibits a depressed mood.  Nursing note and vitals reviewed.    ED Treatments / Results  Labs (all labs ordered are listed, but only abnormal results are displayed) Labs Reviewed  CBC WITH DIFFERENTIAL/PLATELET - Abnormal; Notable for the following:       Result Value   WBC 15.2 (*)    Hemoglobin 12.6 (*)    RDW 20.1 (*)    Neutro Abs 13.7 (*)    All other components within normal limits  COMPREHENSIVE METABOLIC PANEL - Abnormal; Notable for the following:    Sodium 130 (*)    Chloride 94 (*)    Glucose, Bld 193 (*)  BUN 45 (*)    Total Protein 5.9 (*)    Albumin 1.8 (*)    Alkaline Phosphatase 131  (*)    Total Bilirubin 3.6 (*)    GFR calc non Af Amer 59 (*)    All other components within normal limits  PROTIME-INR - Abnormal; Notable for the following:    Prothrombin Time 20.2 (*)    All other components within normal limits  URINALYSIS, ROUTINE W REFLEX MICROSCOPIC - Abnormal; Notable for the following:    Color, Urine AMBER (*)    All other components within normal limits  BRAIN NATRIURETIC PEPTIDE - Abnormal; Notable for the following:    B Natriuretic Peptide 518.2 (*)    All other components within normal limits  LACTIC ACID, PLASMA - Abnormal; Notable for the following:    Lactic Acid, Venous 2.0 (*)    All other components within normal limits  TROPONIN I - Abnormal; Notable for the following:    Troponin I 0.56 (*)    All other components within normal limits  GLUCOSE, CAPILLARY - Abnormal; Notable for the following:    Glucose-Capillary 224 (*)    All other components within normal limits  I-STAT CG4 LACTIC ACID, ED - Abnormal; Notable for the following:    Lactic Acid, Venous 2.68 (*)    All other components within normal limits  I-STAT CHEM 8, ED - Abnormal; Notable for the following:    Sodium 130 (*)    Chloride 96 (*)    BUN 56 (*)    Glucose, Bld 194 (*)    All other components within normal limits  I-STAT VENOUS BLOOD GAS, ED - Abnormal; Notable for the following:    pH, Ven 7.491 (*)    pCO2, Ven 34.4 (*)    pO2, Ven 51.0 (*)    Acid-Base Excess 3.0 (*)    All other components within normal limits  I-STAT CG4 LACTIC ACID, ED - Abnormal; Notable for the following:    Lactic Acid, Venous 2.50 (*)    All other components within normal limits  MRSA PCR SCREENING  CULTURE, BLOOD (ROUTINE X 2)  CULTURE, BLOOD (ROUTINE X 2)  CULTURE, EXPECTORATED SPUTUM-ASSESSMENT  APTT  MAGNESIUM  PHOSPHORUS  TSH  T4, FREE  MAGNESIUM  BASIC METABOLIC PANEL  MAGNESIUM  PHOSPHORUS  HEPARIN LEVEL (UNFRACTIONATED)  CBC  HEMOGLOBIN A1C  TROPONIN I  I-STAT  TROPOININ, ED  I-STAT CG4 LACTIC ACID, ED    EKG  EKG Interpretation  Date/Time:  Tuesday Jun 01 2016 15:59:03 EDT Ventricular Rate:  173 PR Interval:    QRS Duration: 74 QT Interval:  249 QTC Calculation: 423 R Axis:   29 Text Interpretation:  Atrial fibrillation with rapid V-rate Ventricular premature complex Repolarization abnormality, prob rate related Confirmed by Hazle Coca [phone]) on 06/01/2016 4:10:05 PM       Radiology Dg Chest Port 1 View  Result Date: 06/01/2016 CLINICAL DATA:  Lung cancer, shortness of breath for few weeks, hypertension, coronary disease, paroxysmal atrial fibrillation EXAM: PORTABLE CHEST 1 VIEW COMPARISON:  Portable exam 1640 hours compared to 05/10/2016 FINDINGS: Enlargement of cardiac silhouette. Mediastinal contours and pulmonary vascularity normal. Seed implants are seen at the LEFT mid lung corresponding to a cavitary neoplasm as noted on an earlier PET-CT. Mild central peribronchial thickening. Infiltrate identified at LEFT lung base which could represent pneumonia or postobstructive pneumonitis. Remaining lungs clear. No pleural effusion or pneumothorax. 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 LEFT basilar infiltrate.  Mild enlargement of cardiac silhouette. Electronically Signed   By: Lavonia Dana M.D.   On: 06/01/2016 16:55    Procedures Procedures (including critical care time)  Medications Ordered in ED Medications  vancomycin (VANCOCIN) IVPB 750 mg/150 ml premix (not administered)  ceFEPIme (MAXIPIME) 1 g in dextrose 5 % 50 mL IVPB (not administered)  0.9 %  sodium chloride infusion (75 mL/hr Intravenous New Bag/Given 06/01/16 2145)  0.9 %  sodium chloride infusion (not administered)  clopidogrel (PLAVIX) tablet 75 mg (not administered)  rosuvastatin (CRESTOR) tablet 10 mg (not administered)  pantoprazole (PROTONIX) EC tablet 40 mg (not administered)  sucralfate (CARAFATE) 1 GM/10ML suspension 1 g (1 g Oral Given 06/01/16  2207)  amiodarone (NEXTERONE) 1.8 mg/mL load via infusion 150 mg (150 mg Intravenous Bolus from Bag 06/01/16 2043)    Followed by  amiodarone (NEXTERONE PREMIX) 360-4.14 MG/200ML-% (1.8 mg/mL) IV infusion (60 mg/hr Intravenous New Bag/Given 06/01/16 2336)    Followed by  amiodarone (NEXTERONE PREMIX) 360-4.14 MG/200ML-% (1.8 mg/mL) IV infusion (not administered)  heparin ADULT infusion 100 units/mL (25000 units/262mL sodium chloride 0.45%) (1,200 Units/hr Intravenous New Bag/Given 06/01/16 2022)  insulin aspart (novoLOG) injection 0-15 Units (not administered)  insulin aspart (novoLOG) injection 0-5 Units (2 Units Subcutaneous Given 06/01/16 2207)  levalbuterol (XOPENEX) nebulizer solution 0.63 mg (not administered)  ipratropium (ATROVENT) nebulizer solution 0.5 mg (not administered)  sodium chloride 0.9 % bolus 1,000 mL (0 mLs Intravenous Stopped 06/01/16 1713)  esmolol (BREVIBLOC) 2000 mg / 100 mL (20 mg/mL) infusion (0 mcg/kg/min  82.9 kg Intravenous Stopped 06/01/16 2030)  ceFEPIme (MAXIPIME) 2 g in dextrose 5 % 50 mL IVPB (0 g Intravenous Stopped 06/01/16 1849)  vancomycin (VANCOCIN) 2,000 mg in sodium chloride 0.9 % 500 mL IVPB (2,000 mg Intravenous Transfusing/Transfer 06/01/16 2037)  sodium chloride 0.9 % bolus 1,000 mL (0 mLs Intravenous Stopped 06/01/16 1849)  levalbuterol (XOPENEX) nebulizer solution 1.25 mg (1.25 mg Nebulization Given 06/01/16 1840)  sodium chloride 0.9 % bolus 1,000 mL (0 mLs Intravenous Stopped 06/01/16 1934)  heparin bolus via infusion 4,000 Units (4,000 Units Intravenous Bolus from Bag 06/01/16 2023)  metoprolol tartrate (LOPRESSOR) injection 5 mg (5 mg Intravenous Given 06/01/16 2336)     Initial Impression / Assessment and Plan / ED Course  I have reviewed the triage vital signs and the nursing notes.  Pertinent labs & imaging results that were available during my care of the patient were reviewed by me and considered in my medical decision making (see chart for  details).     Patient is a 76 year old male with [name], COPD, lung cancer status post last chemotherapy on Tuesday presents with 2 days worsening shortness breath cough fatigue and decreased by mouth intake. He also reports palpitations for 2 days well. Upon my assessment he is tachycardic with what appears to be A. fib with course breath sounds throughout. He does appear to depressed and states to me that he is "over all of this and that I have stopped taking all my meds for 2 weeks". He does tell me that he wants to feel better come into the hospital. Workup reveals possible left lower lobe pneumonia. Given patient's history of lung cancer with active chemotherapy will start him on healthcare associated pneumonia coverage. He'll be given 30 mL per KG IV fluids. Blood work and blood culture drawn. He was started on esmolol drip for his A. fib with RVR given the diltiazem shortage. Upon reassessment he is requiring 2 L of  oxygen. He'll be admitted to the ICU for further management and evaluation.  Confirmed with family for CODE STATUS. I have counseled to palliative care.  Final Clinical Impressions(s) / ED Diagnoses   Final diagnoses:  Paroxysmal atrial fibrillation (HCC)  Atrial fibrillation with RVR (HCC)  SOB (shortness of breath)  Malignant neoplasm of lower lobe of left lung ([place])  HCAP (healthcare-associated pneumonia)    New Prescriptions Current Discharge Medication List       Heriberto Antigua, MD 06/02/16 Sharl Ma    Quintella Reichert, MD 06/02/16 [phone]

## 2016-06-01 NOTE — Telephone Encounter (Signed)
Called pt, spoke with wife who advised he has fallen, not eating, refusing to leave house, coughing up blood. " He gets really upset and I would like a nurse to come out to the house to evaluate him". Instructed wife to call 911 as pt needs to be further evaluated by with wife, to cal pt needs to be go to ED, wife

## 2016-06-02 ENCOUNTER — Encounter (HOSPITAL_COMMUNITY): Payer: Self-pay | Admitting: Oncology

## 2016-06-02 ENCOUNTER — Inpatient Hospital Stay (HOSPITAL_COMMUNITY): Payer: Medicare Other

## 2016-06-02 ENCOUNTER — Telehealth: Payer: Self-pay | Admitting: Internal Medicine

## 2016-06-02 DIAGNOSIS — J9601 Acute respiratory failure with hypoxia: Principal | ICD-10-CM

## 2016-06-02 DIAGNOSIS — J189 Pneumonia, unspecified organism: Secondary | ICD-10-CM

## 2016-06-02 DIAGNOSIS — I482 Chronic atrial fibrillation: Secondary | ICD-10-CM

## 2016-06-02 DIAGNOSIS — Z7901 Long term (current) use of anticoagulants: Secondary | ICD-10-CM

## 2016-06-02 DIAGNOSIS — K209 Esophagitis, unspecified: Secondary | ICD-10-CM

## 2016-06-02 DIAGNOSIS — L8990 Pressure ulcer of unspecified site, unspecified stage: Secondary | ICD-10-CM | POA: Insufficient documentation

## 2016-06-02 DIAGNOSIS — I4891 Unspecified atrial fibrillation: Secondary | ICD-10-CM

## 2016-06-02 DIAGNOSIS — C3432 Malignant neoplasm of lower lobe, left bronchus or lung: Secondary | ICD-10-CM

## 2016-06-02 DIAGNOSIS — Z515 Encounter for palliative care: Secondary | ICD-10-CM

## 2016-06-02 DIAGNOSIS — Z7189 Other specified counseling: Secondary | ICD-10-CM

## 2016-06-02 LAB — HEPARIN LEVEL (UNFRACTIONATED): Heparin Unfractionated: 0.61 IU/mL (ref 0.30–0.70)

## 2016-06-02 LAB — BASIC METABOLIC PANEL
Anion gap: 8 (ref 5–15)
BUN: 40 mg/dL — ABNORMAL HIGH (ref 6–20)
CHLORIDE: 103 mmol/L (ref 101–111)
CO2: 23 mmol/L (ref 22–32)
CREATININE: 1.03 mg/dL (ref 0.61–1.24)
Calcium: 8.5 mg/dL — ABNORMAL LOW (ref 8.9–10.3)
Glucose, Bld: 177 mg/dL — ABNORMAL HIGH (ref 65–99)
POTASSIUM: 4.2 mmol/L (ref 3.5–5.1)
SODIUM: 134 mmol/L — AB (ref 135–145)

## 2016-06-02 LAB — GLUCOSE, CAPILLARY
GLUCOSE-CAPILLARY: 175 mg/dL — AB (ref 65–99)
Glucose-Capillary: 184 mg/dL — ABNORMAL HIGH (ref 65–99)

## 2016-06-02 LAB — CBC
HCT: 36.8 % — ABNORMAL LOW (ref 39.0–52.0)
Hemoglobin: 11.2 g/dL — ABNORMAL LOW (ref 13.0–17.0)
MCH: 26 pg (ref 26.0–34.0)
MCHC: 30.4 g/dL (ref 30.0–36.0)
MCV: 85.4 fL (ref 78.0–100.0)
PLATELETS: 206 10*3/uL (ref 150–400)
RBC: 4.31 MIL/uL (ref 4.22–5.81)
RDW: 20.2 % — AB (ref 11.5–15.5)
WBC: 12.3 10*3/uL — ABNORMAL HIGH (ref 4.0–10.5)

## 2016-06-02 LAB — MRSA PCR SCREENING: MRSA BY PCR: NEGATIVE

## 2016-06-02 LAB — TROPONIN I: TROPONIN I: 0.05 ng/mL — AB (ref <0.03)

## 2016-06-02 LAB — MAGNESIUM: MAGNESIUM: 2.3 mg/dL (ref 1.7–2.4)

## 2016-06-02 LAB — PHOSPHORUS: PHOSPHORUS: 3.8 mg/dL (ref 2.5–4.6)

## 2016-06-02 MED ORDER — MORPHINE SULFATE (CONCENTRATE) 10 MG/0.5ML PO SOLN: 5.0000 mg | ORAL | 0 refills | Status: AC | PRN

## 2016-06-02 MED ORDER — METHYLPREDNISOLONE SODIUM SUCC 125 MG IJ SOLR
60.0000 mg | Freq: Two times a day (BID) | INTRAMUSCULAR | Status: DC
  Filled 2016-06-02: qty 2
  Filled 2016-06-02: qty 0.96

## 2016-06-02 MED ORDER — DIGOXIN 0.25 MG/ML IJ SOLN
0.2500 mg | Freq: Once | INTRAMUSCULAR | Status: AC
  Administered 2016-06-02: 0.25 mg via INTRAVENOUS
  Filled 2016-06-02: qty 2

## 2016-06-02 MED ORDER — MORPHINE SULFATE (CONCENTRATE) 10 MG/0.5ML PO SOLN
5.0000 mg | ORAL | Status: DC | PRN
  Administered 2016-06-02: 5 mg via ORAL
  Filled 2016-06-02: qty 0.5

## 2016-06-02 MED ORDER — METOPROLOL TARTRATE 5 MG/5ML IV SOLN
5.0000 mg | Freq: Four times a day (QID) | INTRAVENOUS | Status: DC | PRN
  Administered 2016-06-02: 5 mg via INTRAVENOUS
  Filled 2016-06-02: qty 5

## 2016-06-02 MED ORDER — DILTIAZEM HCL 100 MG IV SOLR
5.0000 mg/h | INTRAVENOUS | Status: DC
  Administered 2016-06-02: 5 mg/h via INTRAVENOUS
  Filled 2016-06-02: qty 100

## 2016-06-02 MED ORDER — ENSURE ENLIVE PO LIQD: 237.0000 mL | Freq: Two times a day (BID) | ORAL | Status: DC

## 2016-06-02 MED ORDER — SALINE SPRAY 0.65 % NA SOLN
1.0000 | NASAL | Status: DC | PRN
  Filled 2016-06-02: qty 44

## 2016-06-02 MED ORDER — DILTIAZEM LOAD VIA INFUSION
20.0000 mg | Freq: Once | INTRAVENOUS | Status: AC
  Administered 2016-06-02: 20 mg via INTRAVENOUS
  Filled 2016-06-02: qty 20

## 2016-06-02 NOTE — Consult Note (Signed)
Consultation Note Date: 06/02/2016   Patient Name: Travis [name]  DOB: 09-18-1940  MRN: [id number]  Age / Sex: 76 y.o., male  PCP: Corine Shelter, PA-C Referring Physician: Javier Glazier, MD  Reason for Consultation: Establishing goals of care  HPI/Patient Profile: 76 y.o. male  with past medical history of squamous cell lung cancer, s/p LUL lobectomy and rad seeds in 2007 (no chemo, in surveillance since that point), CAD, PAF on Xarelto and Multaq, COPD, OSA on CPAP, AAA, and HTN. In March he was diagnosed with [recurrent]  NSCLC, stage IV, squamous cell carcinoma. He is followed by Dr. Julien Nordmann and has already completed a short course of concurrent chemoradiation. Planned to start chemo again on 5/29, with Carbo/Paclitaxel every three weeks. On 5/29, however, he presented to the ED with shortness of breath with productive cough and wheezing. He was admitted on 06/01/2016 with possible HCAP, COPD exacerbation, and A. Fib wtih RVR. Palliative consulted to assist in goals.  Clinical Assessment and Goals of Care: I met Travis [name] at his bedside and explained my role on his team. He was very lethargic and related sleeping poorly overnight due to symptom burden and frequently being woken. That said, he was open to speaking with me. The first thing he told me was that he wanted to go home. As I explored this desire he explained that he had aggressive and incurable cancer, had not done well with treatment so far, and was finding himself becoming weaker. Over the last month he has felt increasingly sure that his time is limited, and he has chosen to stop taking his medication, pushed off appointments, and been very resistant to coming back to the hospital. He was very afraid that if he came to the hospital, he would never leave.   In talking though his concerns, I explained the seriousness of his acute issues, and the potential that his symptoms could be improved with  treatment here. I also explained that, if he were to go home now, I expect his time would be very short with hours to days left. Alternatively, he may have longer at home with treatment of these acute issues. He did acknowledge this information, but felt strongly that being at home was the right choice, even if it meant he would die sooner. Finally, we did talk about code status. He was clear that he would not want any aggressive measures taken in the even he had cardiac or respiratory arrest. He wants to die naturally with medications to assist in comfort.   His wife, Travis [name], then arrived at his bedside. I shared the sentiments he had expressed to me. She felt trying to treat the treatable was the best course of action, and then transitioning home with Hospice once he felt better and was medically optimized. Despite her preference, he remained steadfast in his desire to not treat anything and go home to die. She does want to respect his wishes, but is deeply saddened by the prospect of bringing him home to die when there are options to improve his quality of life. At this point he is refusing oral medication, IV placement, and remains insistent on leaving.  Primary Decision Maker PATIENT. While I am concerned there is likely a component of depression at play, I do believe Travis [name] has capacity to make his own medical decisions. He is able to verbalize a clear rationale for declining medical treatment, and is able to explain the care options and likely outcomes with these options.  SUMMARY OF RECOMMENDATIONS    DNR, plan to transition home ASAP with Hospice  For discharge, he will need transport home and home O2. The rest of the home equipment can be coordinated by Hospice after discharge  Code Status/Advance Care Planning:  DNR  Symptom Management:   [name] has significant symptom burden, which includes dyspnea, painful swallowing, and anxiety. He is, however, refusing any medication to help  manage these symptoms. I discussed non-pharmacologic approaches to management, however he declined learning them or trying them.   Palliative Prophylaxis:   Frequent Pain Assessment  Psycho-social/Spiritual:   Desire for further Chaplaincy support:no  Additional Recommendations: Education on Hospice  Prognosis:   Hours - Days  Discharge Planning: Home with Hospice      Primary Diagnoses: Present on Admission: . HCAP (healthcare-associated pneumonia)   I have reviewed the medical record, interviewed the patient and family, and examined the patient. The following aspects are pertinent.  Past Medical History:  Diagnosis Date  . AAA (abdominal aortic aneurysm) ([place])   . Anemia   . CAD (coronary artery disease)    s/p RCA atherectomy 1994, cath 1999 with occluded RCA with left to right collaterals, PCI of LAD 2007, s/p PCI of LAD for instent thrombosis 2008, PCI 08/2015 with DES to LAD due to ISR with known L-R collaterals to RCA  . COPD (chronic obstructive pulmonary disease) ([place])   . Diabetes mellitus without complication (HCC)    diet controlled   . Dyslipidemia   . Hyperlipidemia   . Hypertension   . Insomnia   . Lung cancer [facility])    s/p lobectomy, chemo and radiation therapy  . OSA (obstructive sleep apnea)    intolerant to CPAP  . PAF (paroxysmal atrial fibrillation) ([place])   . Psoriasis   . Squamous cell carcinoma of lung, stage III, left ([place]) 03/18/2016  . Tobacco abuse    Social History   Social History  . Marital status: Married    Spouse name: N/A  . Number of children: 3  . Years of education: N/A   Occupational History  . [place]   Social History Main Topics  . Smoking status: Former Smoker    Packs/day: 0.25    Years: 58.00    Types: Cigarettes  . Smokeless tobacco: Current User    Types: Snuff  . Alcohol use No  . Drug use: No  . Sexual activity: Not Asked   Other Topics Concern  . None   Social History Narrative  .  None   Family History  Problem Relation Age of Onset  . Heart attack Father   . Heart disease Mother   . Heart attack Mother   . Coronary artery disease Brother   . Lymphoma Brother   . Breast cancer Sister   . Diabetes Sister   . Heart disease Sister   . Colon cancer Neg Hx   . Stomach cancer Neg Hx    Scheduled Meds: . clopidogrel  75 mg Oral Daily  . feeding supplement (ENSURE ENLIVE)  237 mL Oral BID BM  . insulin aspart  0-15 Units Subcutaneous TID WC  . insulin aspart  0-5 Units Subcutaneous QHS  . ipratropium  0.5 mg Nebulization Q6H  . pantoprazole  40 mg Oral Q1200  . rosuvastatin  10 mg Oral q1800  . sucralfate  1 g Oral TID WC & HS   Continuous Infusions: . sodium chloride 75 mL/hr (06/01/16 2145)  . sodium chloride    .  amiodarone 30 mg/hr (06/02/16 0300)  . ceFEPime (MAXIPIME) IV Stopped (06/02/16 0311)  . heparin 1,200 Units/hr (06/01/16 2022)  . vancomycin Stopped (06/02/16 0601)   PRN Meds:.sodium chloride, levalbuterol, metoprolol tartrate Allergies  Allergen Reactions  . Gadolinium Derivatives Hives, Shortness Of Breath, Itching, Swelling and Cough    Pt had severe hives and whelps with swelling around eyes and facial swelling, sob and wheezing  immed post gad inj       Review of Systems  Constitutional: Positive for activity change, appetite change, fatigue and unexpected weight change.  HENT: Positive for sore throat and trouble swallowing. Negative for congestion, facial swelling, hearing loss, mouth sores and sinus pressure.   Eyes: Negative for visual disturbance.  Respiratory: Positive for cough, chest tightness, shortness of breath and wheezing.   Cardiovascular: Positive for chest pain.  Gastrointestinal: Negative for abdominal distention, abdominal pain, diarrhea, nausea and vomiting.  Genitourinary: Negative for difficulty urinating.  Musculoskeletal: Positive for gait problem. Negative for back pain.  Skin: Positive for pallor.    Neurological: Positive for weakness. Negative for dizziness, speech difficulty and light-headedness.  Psychiatric/Behavioral: Positive for sleep disturbance. Negative for confusion. The patient is nervous/anxious.    Physical Exam  Constitutional: He is oriented to person, place, and time. He appears distressed.  Appears older than stated age  HENT:  Head: Normocephalic and atraumatic.  Unable to visualize throat as pt declined fully opening his mouth  Eyes: EOM are normal.  Neck: Normal range of motion. Neck supple.  Cardiovascular:  A fib with RVR  Pulmonary/Chest: Accessory muscle usage present. Tachypnea noted. He is in respiratory distress. He has decreased breath sounds in the right lower field and the left lower field. He has wheezes. He has rhonchi.  Frequent cough  Abdominal: Soft. Bowel sounds are normal.  Musculoskeletal:  Moving all extremities, however significant weakness  Neurological: He is alert and oriented to person, place, and time.  Skin: Skin is warm and dry.  Psychiatric: Judgment and thought content normal. His speech is delayed. He is slowed. Cognition and memory are normal. He exhibits a depressed mood.   Vital Signs: BP 115/77 (BP Location: Right Arm)   Pulse (!) 104   Temp 97.3 F (36.3 C) (Axillary)   Resp 19   Ht 6' (1.829 m)   Wt 82.8 kg (182 lb 8.7 oz)   SpO2 99%   BMI 24.76 kg/m  Pain Assessment: No/denies pain     SpO2: SpO2: 99 % O2 Device:SpO2: 99 % O2 Flow Rate: .O2 Flow Rate (L/min): 2 L/min  IO: Intake/output summary:  Intake/Output Summary (Last 24 hours) at 06/02/16 0742 Last data filed at 06/02/16 0700  Gross per 24 hour  Intake          1302.99 ml  Output              500 ml  Net           802.99 ml    LBM:   Baseline Weight: Weight: 82.9 kg (182 lb 12.2 oz) Most recent weight: Weight: 82.8 kg (182 lb 8.7 oz)     Palliative Assessment/Data: PPS 20%    Time Total: 70 minutes Greater than 50%  of this time was spent  counseling and coordinating care related to the above assessment and plan.  Signed by: Charlynn Court, NP Palliative Medicine Team Pager # [phone] (M-F 7a-5p) Team Phone # [phone] (Nights/Weekends)

## 2016-06-02 NOTE — Telephone Encounter (Signed)
Scheduled additional appts per 5/25 LOS - patient to get new schedule next visit.

## 2016-06-02 NOTE — Progress Notes (Signed)
Went over pt's discharge instructions with pt and pt's wife Sonia Baller. Morphine prescription given to wife. Stopped all pt's IV drips. Awaiting transport at this time.

## 2016-06-02 NOTE — Progress Notes (Signed)
ANTICOAGULATION CONSULT NOTE - Follow-up Consult  Pharmacy Consult for heparin Indication: atrial fibrillation  Allergies  Allergen Reactions  . Gadolinium Derivatives Hives, Shortness Of Breath, Itching, Swelling and Cough    Pt had severe hives and whelps with swelling around eyes and facial swelling, sob and wheezing  immed post gad inj        Patient Measurements: Height: 6' (182.9 cm) Weight: 182 lb 8.7 oz (82.8 kg) IBW/kg (Calculated) : 77.6 Heparin Dosing Weight: 82.9kg  Vital Signs: Temp: 97.3 F (36.3 C) (05/30 0341) Temp Source: Axillary (05/30 0341) BP: 121/85 (05/30 0430) Pulse Rate: 49 (05/30 0430)  Labs:  Recent Labs  06/01/16 1613 06/01/16 1629 06/01/16 2203 06/02/16 0500  HGB 12.6* 13.9  --  11.2*  HCT 39.5 41.0  --  36.8*  PLT 220  --   --  206  APTT 36  --   --   --   LABPROT 20.2*  --   --   --   INR 1.70  --   --   --   HEPARINUNFRC  --   --   --  0.61  CREATININE 1.16 1.10  --   --   TROPONINI  --   --  0.56*  --     Estimated Creatinine Clearance: 61.7 mL/min (by C-G formula based on SCr of 1.1 mg/dL).  Assessment: 76yo M with h/o afib but stopped taking PTA Xarelto (last dose 5/15). Pharmacy consulted to dose heparin. Heparin level therapeutic (0.61) on gtt at 1200 units/hr. No bleeding noted  Goal of Therapy:  Heparin level 0.3-0.7 units/ml Monitor platelets by anticoagulation protocol: Yes   Plan:  Continue heparin at 1200 units/hr Will f/u 6 hr confirmatory heparin level  Sherlon Handing, PharmD, BCPS Clinical pharmacist, pager [phone] 06/02/2016 5:38 AM

## 2016-06-02 NOTE — Care Management Note (Addendum)
Case Management Note  Patient Details  Name: Travis [name] MRN: [id number] Date of Birth: 04/28/1940  Subjective/Objective:  Pt admitted with HCAP with new dx of Lung CA                  Action/Plan:    PTA from home with wife.  Plan is for pt to discharge home with hospice.  Pt/wife declined home with hospice list and simply chose [facility].  Agency contacted and referral accepted. Per palliative ; pt will need  Oxygen and [facility] transport home.  Plan is for pt to discharge home today with hopsice   Expected Discharge Date:                  Expected Discharge Plan:  Home w Hospice Care  In-House Referral:     Discharge planning Services  CM Consult  Post Acute Care Choice:    Choice offered to:     DME Arranged:    DME Agency:     HH Arranged:    HH Agency:     Status of Service:     If discussed at [employer], dates discussed:    Additional Comments: Oxygen confirmed to be in the home.  [facility] contacted and has agreed to take pt home with supplemental oxygen needs and current HR ranging 140-160's as long as pt has out of facility DNR paper for transport.  All necessary paperwork including DNR form has been supplied to bedside nurse.  [facility] scheduled to pick up pt from [facility] between 4:30 and 5pm.    Pt now has discharge orders - discharge summary faxed to [facility].  Oxygen to be delivered to pts home within the hour Maryclare Labrador, RN 06/02/2016, 11:07 AM

## 2016-06-02 NOTE — Progress Notes (Signed)
PULMONARY / CRITICAL CARE MEDICINE   Name: Travis [name] MRN: [id number] DOB: December 14, 1940     ADMISSION DATE:  06/01/2016 CONSULTATION DATE:  06/02/2016  REFERRING MD:  Stark Jock  CHIEF COMPLAINT:  Dyspnea  HISTORY OF PRESENT ILLNESS:   76 y/o male with afib, stage IV lung cancer admitted with dyspnea, cough on 5/29.  He was noted to have a new left lower lobe infiltrate and afib with RVR.    SUBJECTIVE:  Dyspnea this morning  Afib with RVR  VITAL SIGNS: BP 108/83   Pulse (!) 39   Temp 97.5 F (36.4 C) (Oral)   Resp (!) 23   Ht 6' (1.829 m)   Wt 82.8 kg (182 lb 8.7 oz)   SpO2 95%   BMI 24.76 kg/m   HEMODYNAMICS:    VENTILATOR SETTINGS:    INTAKE / OUTPUT: I/O last 3 completed shifts: In: 1303 [I.V.:1103; IV Piggyback:200] Out: 500 [Urine:500]  PHYSICAL EXAMINATION: General:  Sitting in bed, slight increased work of breathing, no accessory muscle use Neuro:  Awake, confused, follows commands HEENT:  NCAT, OP clear Cardiovascular:  Irreg irreg tachy Lungs:  Upper airway rhonchi bilaterally, vent supported breathing Abdomen:  BS+, soft, nontender Musculoskeletal:  Normal bulk and tone Skin:  No rash or skin breakdown  LABS:  BMET  Recent Labs Lab 05/28/16 0903 06/01/16 1613 06/01/16 1629 06/02/16 0500  NA 132* 130* 130* 134*  K 4.6 5.0 4.8 4.2  CL  --  94* 96* 103  CO2 25 24  --  23  BUN 40.8* 45* 56* 40*  CREATININE 1.3 1.16 1.10 1.03  GLUCOSE 225* 193* 194* 177*    Electrolytes  Recent Labs Lab 05/28/16 0903 06/01/16 1613 06/01/16 1837 06/01/16 2203 06/02/16 0500  CALCIUM 10.0 9.3  --   --  8.5*  MG  --   --  2.3 2.3 2.3  PHOS  --   --  4.4  --  3.8    CBC  Recent Labs Lab 05/28/16 0903 06/01/16 1613 06/01/16 1629 06/02/16 0500  WBC 20.0* 15.2*  --  12.3*  HGB 11.6* 12.6* 13.9 11.2*  HCT 36.8* 39.5 41.0 36.8*  PLT 260 220  --  206    Coag's  Recent Labs Lab 06/01/16 1613  APTT 36  INR 1.70    Sepsis  Markers  Recent Labs Lab 06/01/16 1630 06/01/16 1849 06/01/16 2203  LATICACIDVEN 2.68* 2.50* 2.0*    ABG No results for input(s): PHART, PCO2ART, PO2ART in the last 168 hours.  Liver Enzymes  Recent Labs Lab 05/28/16 0903 06/01/16 1613  AST 23 34  ALT 29 20  ALKPHOS 162* 131*  BILITOT 2.30* 3.6*  ALBUMIN 2.0* 1.8*    Cardiac Enzymes  Recent Labs Lab 06/01/16 2203 06/02/16 0500  TROPONINI 0.56* 0.05*    Glucose  Recent Labs Lab 06/01/16 2153 06/02/16 0754  GLUCAP 224* 184*    Imaging Dg Chest Port 1 View  Result Date: 06/02/2016 CLINICAL DATA:  Respiratory failure EXAM: PORTABLE CHEST 1 VIEW COMPARISON:  Jun 01, 2016 chest radiograph; PET-CT March 12, 2016 FINDINGS: There remains cavitation in a lesion in the left mid lung with seed implants in this area. There is stable patchy infiltrate in the left upper lobe as well. There is a rather minimal left pleural effusion. Right lung is clear. Heart is mildly enlarged with pulmonary vascularity normal. No adenopathy. There is aortic atherosclerosis. No bone lesions. IMPRESSION: Persistent cavitary lesion left mid lung with seed implants  this area. Patchy infiltrate left upper lobe, stable. No new opacity. Stable cardiac prominence. No demonstrable adenopathy. Electronically Signed   By: Lowella Grip III M.D.   On: 06/02/2016 07:22   Dg Chest Port 1 View  Result Date: 06/01/2016 CLINICAL DATA:  Lung cancer, shortness of breath for few weeks, hypertension, coronary disease, paroxysmal atrial fibrillation EXAM: PORTABLE CHEST 1 VIEW COMPARISON:  Portable exam 1640 hours compared to 05/10/2016 FINDINGS: Enlargement of cardiac silhouette. Mediastinal contours and pulmonary vascularity normal. Seed implants are seen at the LEFT mid lung corresponding to a cavitary neoplasm as noted on an earlier PET-CT. Mild central peribronchial thickening. Infiltrate identified at LEFT lung base which could represent pneumonia or  postobstructive pneumonitis. Remaining lungs clear. No pleural effusion or pneumothorax. 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 LEFT basilar infiltrate. Mild enlargement of cardiac silhouette. Electronically Signed   By: Lavonia Dana M.D.   On: 06/01/2016 16:55     STUDIES:    CULTURES: 5/29 blood >  5/29 resp >   ANTIBIOTICS: 5/29 vanc >  5/29 cefepime>   SIGNIFICANT EVENTS:   LINES/TUBES:   DISCUSSION: 76 y/o male with stage IV lung cancer, AFib with RVR and HCAP  ASSESSMENT / PLAN:  PULMONARY A: Acute respiratory failure with hypxoemia HCAP Lung cancer Stage IV lung cancer P:   Add solumedrol 60mg  IV q12h Keep atrovent and xopenex O2 as needed to maintain O2 saturation > 90% Add saline nasal spray for upper airway cough Continue gerd treatment  CARDIOVASCULAR A:  Hypertension Afib with RVR NSTEMI> demand ischemia P:  Tele Cardiology consult Amiodarone gtt Add diltiazem Monitor troponin Heparin gtt plavix  RENAL A:   Mild hyponatremia P:   Monitor BMET and UOP Replace electrolytes as needed   GASTROINTESTINAL A:   GERD P:   Regular diet PPI  HEMATOLOGIC A:   Anemia with out bleeding P:  Monitor for bleeding Continue heparin/plavix  INFECTIOUS A:   HCAP P:   Monitor culture Continue cefepime and vanc  ENDOCRINE A:   Hyperglycemia P:   SSI  NEUROLOGIC A:   No acute issues P:   Monitor for pain   FAMILY  - Updates: will update wife today My cc time 35 minutes  Roselie Awkward, MD [place] [facility] Pager: [phone] Cell: [phone]  After 3pm or if no response, call [phone]    06/02/2016, 8:31 AM

## 2016-06-02 NOTE — Discharge Summary (Signed)
Physician Discharge Summary       Patient ID: Travis [name] MRN: [id number] DOB/AGE: [birth year] 78 y.o.  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 He was recently admitted overnight back in the beginning of May for atrial fibrillation. He had not been able to take his medications due to what is suspected to be some radiation-related esophagitis. He was placed on amiodarone which enabled him to convert back into normal sinus. He was discharged day later after being instructed that he can crush his medications. He has since been seen by his oncologist to plan start new therapy which consists of carboplatin and paclitaxel. This was scheduled to start 5/29, but before he could go, 5/29 he presented to [facility] ED with complaints of shortness of breath x 2 days with associated productive cough and wheeze. He was noted to have WBC elevation and Left basilar infiltrate on CXR so he was started on broad spectrum antibiotics. Also found to be in AF with RVR and started on esmolol infusion. He became hypotensive and PCCM has been asked to admit.  He was in the hospital for less than 12 hours when he decided that he no longer wanted to pursue any further treatment except for treatment of symptoms. Palliative care was consulted. He insisted on leaving and understands that it likely he will die without treatment. His primary desire is to have a  natural death at home. His wife is in agreement with this plan.    Discharge Plan by active problems  Acute hypoxic respiratory failure in setting of HCAP superimposed on underlying stage IV lung cancer  Terminal care  Plan Discharge to home PRN oxygen PRN percocet and PRN SL morphine  Hospice contacted.  Stopped all treatment that does not apply to comfort.   Significant Hospital tests/ studies  Consults palliative care   Discharge Exam: BP 128/71   Pulse (!) 39   Temp 97.6 F (36.4 C) (Oral)   Resp (!) 27   Ht 6' (1.829 m)   Wt 182 lb 8.7 oz (82.8 kg)   SpO2 (!) 88%   BMI 24.76 kg/m   PHYSICAL EXAMINATION: General:  Sitting in bed, slight increased work of breathing, no accessory muscle use Neuro:  Awake, confused, follows commands HEENT:  NCAT, OP clear Cardiovascular:  Irreg irreg tachy Lungs:  Upper airway rhonchi bilaterally, vent supported breathing Abdomen:  BS+, soft, nontender Musculoskeletal:  Normal bulk and tone Skin:  No rash or skin breakdown  Labs at discharge Lab Results  Component Value Date   CREATININE 1.03 06/02/2016   BUN 40 (H) 06/02/2016   NA 134 (L) 06/02/2016   K 4.2 06/02/2016   CL 103 06/02/2016   CO2 23 06/02/2016   Lab Results  Component Value Date   WBC 12.3 (H) 06/02/2016   HGB 11.2 (L) 06/02/2016   HCT 36.8 (L) 06/02/2016   MCV 85.4 06/02/2016   PLT 206 06/02/2016   Lab Results  Component Value Date   ALT 20 06/01/2016  AST 34 06/01/2016   ALKPHOS 131 (H) 06/01/2016   BILITOT 3.6 (H) 06/01/2016   Lab Results  Component Value Date   INR 1.70 06/01/2016   INR 1.24 05/11/2016   INR 1.13 04/07/2016    Current radiology studies Dg Chest Port 1 View  Result Date: 06/02/2016 CLINICAL DATA:  Respiratory failure EXAM: PORTABLE CHEST 1 VIEW COMPARISON:  Jun 01, 2016 chest radiograph; PET-CT March 12, 2016 FINDINGS: There remains cavitation in a lesion in the left mid lung with seed implants in this area. There is stable  patchy infiltrate in the left upper lobe as well. There is a rather minimal left pleural effusion. Right lung is clear. Heart is mildly enlarged with pulmonary vascularity normal. No adenopathy. There is aortic atherosclerosis. No bone lesions. IMPRESSION: Persistent cavitary lesion left mid lung with seed implants this area. Patchy infiltrate left upper lobe, stable. No new opacity. Stable cardiac prominence. No demonstrable adenopathy. Electronically Signed   By: Lowella Grip III M.D.   On: 06/02/2016 07:22   Dg Chest Port 1 View  Result Date: 06/01/2016 CLINICAL DATA:  Lung cancer, shortness of breath for few weeks, hypertension, coronary disease, paroxysmal atrial fibrillation EXAM: PORTABLE CHEST 1 VIEW COMPARISON:  Portable exam 1640 hours compared to 05/10/2016 FINDINGS: Enlargement of cardiac silhouette. Mediastinal contours and pulmonary vascularity normal. Seed implants are seen at the LEFT mid lung corresponding to a cavitary neoplasm as noted on an earlier PET-CT. Mild central peribronchial thickening. Infiltrate identified at LEFT lung base which could represent pneumonia or postobstructive pneumonitis. Remaining lungs clear. No pleural effusion or pneumothorax. 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 LEFT basilar infiltrate. Mild enlargement of cardiac silhouette. Electronically Signed   By: Lavonia Dana M.D.   On: 06/01/2016 16:55    Disposition:  01-Home or Self Care  Discharge Instructions    Diet - low sodium heart healthy    Complete by:  As directed    Increase activity slowly    Complete by:  As directed      Allergies as of 06/02/2016      Reactions   Gadolinium Derivatives Hives, Shortness Of Breath, Itching, Swelling, Cough   Pt had severe hives and whelps with swelling around eyes and facial swelling, sob and wheezing  immed post gad inj          Medication List    STOP taking these medications   clopidogrel 75 MG tablet Commonly known as:   PLAVIX   ferrous sulfate 325 (65 FE) MG tablet   isosorbide mononitrate 30 MG 24 hr tablet Commonly known as:  IMDUR   magic mouthwash w/lidocaine Soln   methylPREDNISolone 4 MG Tbpk tablet Commonly known as:  MEDROL DOSEPAK   MULTAQ 400 MG tablet Generic drug:  dronedarone   nystatin 100000 UNIT/ML suspension Commonly known as:  MYCOSTATIN   rosuvastatin 10 MG tablet Commonly known as:  CRESTOR   XARELTO 20 MG Tabs tablet Generic drug:  rivaroxaban     TAKE these medications   acetaminophen 325 MG tablet Commonly known as:  TYLENOL Take 2 tablets (650 mg total) by mouth every 4 (four) hours as needed for headache or mild pain.   HYDROcodone-homatropine 5-1.5 MG/5ML syrup Commonly known as:  HYCODAN Take 5 mLs by mouth every 6 (six) hours as needed for cough.   morphine CONCENTRATE 10 MG/0.5ML Soln concentrated solution Place 0.25 mLs (5 mg total) under the tongue every 2 (two) hours as needed for severe pain or  shortness of breath.   nitroGLYCERIN 0.4 MG SL tablet Commonly known as:  NITROSTAT Place 0.4 mg under the tongue every 5 (five) minutes as needed for chest pain. X 3 doses   ondansetron 8 MG tablet Commonly known as:  ZOFRAN Take 1 tablet (8 mg total) by mouth every 8 (eight) hours as needed for nausea or vomiting.   oxyCODONE-acetaminophen 5-325 MG tablet Commonly known as:  PERCOCET/ROXICET Take 1 tablet by mouth every 4 (four) hours as needed for severe pain.   pantoprazole 40 MG tablet Commonly known as:  PROTONIX Take 1 tablet (40 mg total) by mouth daily.   PROAIR HFA 108 (90 Base) MCG/ACT inhaler Generic drug:  albuterol Inhale 2 puffs into the lungs every 4 (four) hours as needed for wheezing or shortness of breath.   sucralfate 1 GM/10ML suspension Commonly known as:  CARAFATE Take 10 mLs (1 g total) by mouth 4 (four) times daily -  with meals and at bedtime.   Tiotropium Bromide-Olodaterol 2.5-2.5 MCG/ACT Aers Commonly known as:   STIOLTO RESPIMAT Inhale 2 puffs into the lungs daily.   traZODone 100 MG tablet Commonly known as:  DESYREL Take 200 mg by mouth at bedtime.            Durable Medical Equipment        Start     Ordered   06/02/16 1241  DME Oxygen  Once    Question Answer Comment  Mode or (Route) Nasal cannula   Oxygen delivery system Gas      06/02/16 1241       Discharged Condition: serious/terminally ill   Physician Statement:   The Patient was personally examined, the discharge assessment and plan has been personally reviewed and I agree with ACNP Annelise Mccoy's assessment and plan. 45 minutes of time have been dedicated to discharge assessment, planning and discharge instructions.   Signed: Clementeen Graham 06/02/2016, 1:58 PM

## 2016-06-02 NOTE — Progress Notes (Signed)
Pt discharged via PTAR on stretcher. IVs removed. Wife at bedside and aware.

## 2016-06-02 NOTE — Consult Note (Signed)
Cardiology Consultation:   Patient ID: Travis [name]; [id number]; 1940/06/10   Admit date: 06/01/2016 Date of Consult: 06/02/2016  Primary Care Provider: Corine Shelter, PA-C Primary Cardiologist: Dr. Radford Pax  Patient Profile:   Travis [name] is a 76 y.o. male with a hx of CAD with multiple PCI and DES, OSA on CPAP, dyslipidemia, PAF on Xarelto, HTN, COPD, lung cancer Stage IV(T2b, N2, M1b). , ongoing tobacco use who is being seen today for the evaluation of atrial fibrillation with RVR at the request of Dr. Ashok Cordia.  The patient has a cardiac history significant for PCI of the RCA 1994 with repeat cath 1999 with occluded RCA with left to right collaterals, PCI of LAD 2007, PCI of LAD 2008 for instent thrombosis, PCI 08/2015 with DES to LAD due to ISR with known L-R collaterals to RCA.  It was noted at the time that  "Based on 3 overlapping stents, would probably continue Plavix lifelong if possible and no bleeding issues."   History of Present Illness:   Travis [name] developed shortness of breath for the last 2 days and presented to the ED. He was found to be in atrial fibrialltion with rates int he 160's-170's. He was also noted to have leukocytosis and left basilar infiltrate on CXR and was started on broad spectrum antibiotics. An esmolol infusion was started for afib RVR but the pat became hypotensive. He is currently on amiodarone IV load and cardizem 5 mg/hr with heart rates still in the 150's-160's.  He was seen on 05/11/16 for atrial fib with RVR. He was getting radiation therapy for lung cancer and was having having difficulty swallowing his Xarelto and Multaq. He was put on amiodarone and heparin and converted to sinus rhythm. After conferring with the pharmacist it was decided that the pat could crush the Xarelto and Multaq if needed. The patient did not go to his follow up appt because he was feeling so bad that it was difficult to get out of the house.  Past Medical History:    Diagnosis Date  . AAA (abdominal aortic aneurysm) ([place])   . Anemia   . CAD (coronary artery disease)    s/p RCA atherectomy 1994, cath 1999 with occluded RCA with left to right collaterals, PCI of LAD 2007, s/p PCI of LAD for instent thrombosis 2008, PCI 08/2015 with DES to LAD due to ISR with known L-R collaterals to RCA  . COPD (chronic obstructive pulmonary disease) ([place])   . Diabetes mellitus without complication (HCC)    diet controlled   . Dyslipidemia   . History of external beam radiation therapy 04/14/16-05/04/16   Left lung/ 30 Gy in 15 fractions  . Hyperlipidemia   . Hypertension   . Insomnia   . Lung cancer [facility])    s/p lobectomy, chemo and radiation therapy  . OSA (obstructive sleep apnea)    intolerant to CPAP  . PAF (paroxysmal atrial fibrillation) ([place])   . Psoriasis   . Squamous cell carcinoma of lung, stage III, left ([place]) 03/18/2016  . Tobacco abuse     Past Surgical History:  Procedure Laterality Date  . APPENDECTOMY    . CARDIAC CATHETERIZATION  1999  . CARDIAC CATHETERIZATION N/A 08/22/2015   Procedure: Left Heart Cath and Coronary Angiography;  Surgeon: Leonie Man, MD;  Location: [place] [facility];  Service: Cardiovascular;  Laterality: N/A;  . CARDIAC CATHETERIZATION N/A 08/22/2015   Procedure: Coronary Stent Intervention;  Surgeon: Leonie Man, MD;  Location: [place] [facility];  Service: Cardiovascular;  Laterality: N/A;  . EXPLORATORY LAPAROTOMY     secondary to trauma  . multiple PCT's to RCA and LAD  2007/2008  . s/p lung lobectomy  2007  . VIDEO BRONCHOSCOPY N/A 08/28/2012   Procedure: VIDEO BRONCHOSCOPY WITHOUT FLUORO;  Surgeon: Elsie Stain, MD;  Location: [place];  Service: Cardiopulmonary;  Laterality: N/A;  . VIDEO BRONCHOSCOPY Bilateral 03/11/2016   Procedure: VIDEO BRONCHOSCOPY WITHOUT FLUORO;  Surgeon: Tanda Rockers, MD;  Location: [facility];  Service: Cardiopulmonary;  Laterality: Bilateral;     Inpatient  Medications: Scheduled Meds: . clopidogrel  75 mg Oral Daily  . diltiazem  20 mg Intravenous Once  . feeding supplement (ENSURE ENLIVE)  237 mL Oral BID BM  . insulin aspart  0-15 Units Subcutaneous TID WC  . insulin aspart  0-5 Units Subcutaneous QHS  . ipratropium  0.5 mg Nebulization Q6H  . methylPREDNISolone (SOLU-MEDROL) injection  60 mg Intravenous Q12H  . pantoprazole  40 mg Oral Q1200  . rosuvastatin  10 mg Oral q1800  . sucralfate  1 g Oral TID WC & HS   Continuous Infusions: . sodium chloride 75 mL/hr (06/01/16 2145)  . sodium chloride    . amiodarone 30 mg/hr (06/02/16 0824)  . ceFEPime (MAXIPIME) IV Stopped (06/02/16 0311)  . diltiazem (CARDIZEM) infusion    . heparin 1,200 Units/hr (06/01/16 2022)  . vancomycin Stopped (06/02/16 0601)   PRN Meds: sodium chloride, levalbuterol, metoprolol tartrate, sodium chloride  Allergies:    Allergies  Allergen Reactions  . Gadolinium Derivatives Hives, Shortness Of Breath, Itching, Swelling and Cough    Pt had severe hives and whelps with swelling around eyes and facial swelling, sob and wheezing  immed post gad inj        Social History:   Social History   Social History  . Marital status: Married    Spouse name: N/A  . Number of children: 3  . Years of education: N/A   Occupational History  . [employer]   Social History Main Topics  . Smoking status: Former Smoker    Packs/day: 0.25    Years: 58.00    Types: Cigarettes  . Smokeless tobacco: Current User    Types: Snuff  . Alcohol use No  . Drug use: No  . Sexual activity: Not on file   Other Topics Concern  . Not on file   Social History Narrative  . No narrative on file    Family History:   The patient's family history includes Breast cancer in his sister; Coronary artery disease in his brother; Diabetes in his sister; Heart attack in his father and mother; Heart disease in his mother and sister; Lymphoma in his brother. There is no  history of Colon cancer or Stomach cancer.  ROS:  Please see the history of present illness.  Pt does not want to answer my questions. He does not want any further treatment.    Physical Exam/Data:   Vitals:   06/02/16 0630 06/02/16 0700 06/02/16 0756 06/02/16 0800  BP: 128/79 115/77  108/83  Pulse: (!) 39 (!) 104  (!) 39  Resp: (!) 22 19  (!) 23  Temp:   97.5 F (36.4 C)   TempSrc:   Oral   SpO2: 99% 99%  95%  Weight:      Height:        Intake/Output Summary (Last 24 hours) at 06/02/16 0929 Last  data filed at 06/02/16 0824  Gross per 24 hour  Intake          1406.69 ml  Output              550 ml  Net           856.69 ml   Filed Weights   06/01/16 1900 06/01/16 2100 06/02/16 0236  Weight: 182 lb 12.2 oz (82.9 kg) 182 lb 8.7 oz (82.8 kg) 182 lb 8.7 oz (82.8 kg)   Body mass index is 24.76 kg/m.  General:  Chronically ill appearing HEENT: normal Neck: no JVD Endocrine:  No thryomegaly Vascular: No carotid bruits; FA pulses 2+ bilaterally without bruits  Cardiac:  normal S1, S2; tachycardiac, irregularly irregular rhythm; no murmur  Lungs:  Course sounds with inspiratory and expiratory wheezes Abd: soft, nontender, no hepatomegaly  Ext: no edema Musculoskeletal:  No deformities, BUE and BLE strength normal and equal Skin: warm and dry  Neuro:  CNs 2-12 intact, no focal abnormalities noted Psych:  Normal affect    EKG:  The EKG was personally reviewed and demonstrates atrial fibrillation at 173 bpm  Relevant CV Studies: Last echo 08/18/16 Study Conclusions  - Left ventricle: The cavity size was normal. Wall thickness was   normal. Systolic function was normal. The estimated ejection   fraction was in the range of 50% to 55%. Wall motion was normal;   there were no regional wall motion abnormalities. Left   ventricular diastolic function parameters were normal. - Right atrium: The atrium was mildly dilated. - Pulmonary arteries: Systolic pressure was mildly  increased. PA   peak pressure: 37 mm Hg (S).  Laboratory Data:  Chemistry Recent Labs Lab 05/28/16 0903 06/01/16 1613 06/01/16 1629 06/02/16 0500  NA 132* 130* 130* 134*  K 4.6 5.0 4.8 4.2  CL  --  94* 96* 103  CO2 25 24  --  23  GLUCOSE 225* 193* 194* 177*  BUN 40.8* 45* 56* 40*  CREATININE 1.3 1.16 1.10 1.03  CALCIUM 10.0 9.3  --  8.5*  GFRNONAA  --  59*  --  >60  GFRAA  --  >60  --  >60  ANIONGAP 12* 12  --  8     Recent Labs Lab 05/28/16 0903 06/01/16 1613  PROT 6.3* 5.9*  ALBUMIN 2.0* 1.8*  AST 23 34  ALT 29 20  ALKPHOS 162* 131*  BILITOT 2.30* 3.6*   Hematology Recent Labs Lab 05/28/16 0903 06/01/16 1613 06/01/16 1629 06/02/16 0500  WBC 20.0* 15.2*  --  12.3*  RBC 4.37 4.67  --  4.31  HGB 11.6* 12.6* 13.9 11.2*  HCT 36.8* 39.5 41.0 36.8*  MCV 84.2 84.6  --  85.4  MCH 26.5* 27.0  --  26.0  MCHC 31.5* 31.9  --  30.4  RDW 18.7* 20.1*  --  20.2*  PLT 260 220  --  206   Cardiac Enzymes Recent Labs Lab 06/01/16 2203 06/02/16 0500  TROPONINI 0.56* 0.05*    Recent Labs Lab 06/01/16 1627  TROPIPOC 0.03    BNP Recent Labs Lab 06/01/16 1837  BNP 518.2*    DDimer No results for input(s): DDIMER in the last 168 hours.  Radiology/Studies:  Dg Chest Port 1 View  Result Date: 06/02/2016 CLINICAL DATA:  Respiratory failure EXAM: PORTABLE CHEST 1 VIEW COMPARISON:  Jun 01, 2016 chest radiograph; PET-CT March 12, 2016 FINDINGS: There remains cavitation in a lesion in the left mid lung with seed  implants in this area. There is stable patchy infiltrate in the left upper lobe as well. There is a rather minimal left pleural effusion. Right lung is clear. Heart is mildly enlarged with pulmonary vascularity normal. No adenopathy. There is aortic atherosclerosis. No bone lesions. IMPRESSION: Persistent cavitary lesion left mid lung with seed implants this area. Patchy infiltrate left upper lobe, stable. No new opacity. Stable cardiac prominence. No demonstrable  adenopathy. Electronically Signed   By: Lowella Grip III M.D.   On: 06/02/2016 07:22   Dg Chest Port 1 View  Result Date: 06/01/2016 CLINICAL DATA:  Lung cancer, shortness of breath for few weeks, hypertension, coronary disease, paroxysmal atrial fibrillation EXAM: PORTABLE CHEST 1 VIEW COMPARISON:  Portable exam 1640 hours compared to 05/10/2016 FINDINGS: Enlargement of cardiac silhouette. Mediastinal contours and pulmonary vascularity normal. Seed implants are seen at the LEFT mid lung corresponding to a cavitary neoplasm as noted on an earlier PET-CT. Mild central peribronchial thickening. Infiltrate identified at LEFT lung base which could represent pneumonia or postobstructive pneumonitis. Remaining lungs clear. No pleural effusion or pneumothorax. 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 LEFT basilar infiltrate. Mild enlargement of cardiac silhouette. Electronically Signed   By: Lavonia Dana M.D.   On: 06/01/2016 16:55    Assessment and Plan:   1. Atrial fibrillation with RVR -Pt has PAF and usually maintains sinus rhythm when he takes his medications regularly. Has been unable to take his medications due to radiation related esophagitis.  -Is likely exacerbated by pneumonia -CHA2DS2/VAS Stroke Risk Score 4 (CAD, HTN, Age (2)) Pt stopped taking his Xarelto 5/15. Currently on heparin for stroke risk reduction. -Was on esmolol IV but developed hypotension, now on amiodarone load and low dose of diltiazem IV, but still in atrial fib with rates in the 150's-170's -BNP is 518.2 likely related to decreased cardiac output with afib RVR -Pt is not a good candidate for cardioversion, poor pulmonary status. -Pt is not wanting to continue treatment. He wants to go home with hospice even if it means that he will likely die. He states that he is not willing to take medications to treat the afib or any thing else. Will discuss with Dr. Meda Coffee.  2. Hypertension -Blood pressures have  been soft, not on meds -Currently BP stable with SBP 100- 120's  3. CAD -Hx of multiple PCI/stents -Continue Plavix and stain. Historically not on beta blocker due to resting bradycardia.   4. Pneumonia -WBC's 20K on 5/25-->15.2>12.3.  Lactic acid 2.68-->2.50-->2.0 -CXR: left basilar infiltrate -Broad spectrum antibiotics per PCCM  5. Lung cancer -on Chemo and radiation -Palliative care has been consulted and NP states that pt is now not wanting any medications or treatment. He has a fear of dying in the hospital and wants to go home even if it means that he will die. She has presented his options and encouraged him to allow for treatment to help him feel better. He apparently had not been taking his oral meds at home even when crushed.   Signed, Travis Perch, NP  06/02/2016 9:29 AM   The patient was seen, examined and discussed with Travis Perch, NP-C and I agree with the above.   76 y.o. Unfortunate male with a hx of CAD with multiple PCI and DES, OSA on CPAP, dyslipidemia, PAF on Xarelto, HTN, COPD, lung cancer Stage IV(T2b, N2, M1b) , ongoing tobacco use who is being seen today for the evaluation of atrial fibrillation with RVR at the request of Dr.  Nestor. The patient has been on amiodarone drip and cardizem drip and iv fluids since the last night with HR in 160'. The patient really wishes to go home. Because of severe esophigitis he was unable to take any pills PO at home prior to admission including Xarelto. He won't be able to take any medications after the discharge.  Since he wasn't compliant with xarelto we would have to perform TEE for a cardioversion, he is not stable for that given severe post , hypotension and wheezing. He is a high risk for esophageal perforation given radiation esophigitis.  At this point we can only wait for amiodarone loading to start working. I will add Digoxin 0.25 mg iv x 1 fr better HR control.  I totally respect patient's wishes to go home.  With the underlying stage 4 cancer and comorbidities he most probably only has days to leave. He wishes to be at home. I think that it is very reasonable, however would strongly advise hospice assistance for comfort care.   Ena Dawley, MD 06/02/2016

## 2016-06-03 ENCOUNTER — Telehealth: Payer: Self-pay | Admitting: *Deleted

## 2016-06-03 ENCOUNTER — Telehealth: Payer: Self-pay | Admitting: Oncology

## 2016-06-03 LAB — HEMOGLOBIN A1C
HEMOGLOBIN A1C: 8.2 % — AB (ref 4.8–5.6)
Mean Plasma Glucose: 189 mg/dL

## 2016-06-03 NOTE — Telephone Encounter (Signed)
Patient's wife left a message saying that they have decided to stop all treatments.  She would like to cancel the appointments scheduled with Dr. Julien Nordmann and Dr. Sondra Come.

## 2016-06-03 NOTE — Telephone Encounter (Signed)
Pt's wife called states " Travis [name] has some appts with Dr. Julien Nordmann and radiation and I need to cancel these appts. He will not be coming for any more treatments." Per Hospital d/c notes pt request to go home without any further treatment for symptoms. Message to scheduling.

## 2016-06-06 LAB — CULTURE, BLOOD (ROUTINE X 2)
CULTURE: NO GROWTH
Culture: NO GROWTH
SPECIAL REQUESTS: ADEQUATE
SPECIAL REQUESTS: ADEQUATE

## 2016-06-07 ENCOUNTER — Other Ambulatory Visit: Payer: Medicare Other

## 2016-06-08 ENCOUNTER — Telehealth: Payer: Self-pay | Admitting: Cardiology

## 2016-06-08 NOTE — Telephone Encounter (Signed)
New message    Waunita Schooner from [place] is calling about a drug interaction between drugs, multaq and Levaquin. He said they have tried to call Izora Gala Far about this drug interaction but can not get in touch with her. He said it is a level one interaction.

## 2016-06-08 NOTE — Telephone Encounter (Signed)
Informed Waunita Schooner that Multaq was stopped the last time the patient went to the ED. Waunita Schooner states the Levaquin was changed to Keflex so it doesn't matter. He was grateful for follow-up.

## 2016-06-10 ENCOUNTER — Ambulatory Visit: Payer: Medicare Other | Admitting: Radiation Oncology

## 2016-06-14 ENCOUNTER — Other Ambulatory Visit: Payer: Medicare Other

## 2016-06-21 ENCOUNTER — Ambulatory Visit: Payer: Medicare Other

## 2016-06-21 ENCOUNTER — Other Ambulatory Visit: Payer: Medicare Other

## 2016-06-21 ENCOUNTER — Ambulatory Visit: Payer: Medicare Other | Admitting: Internal Medicine

## 2016-06-28 ENCOUNTER — Other Ambulatory Visit: Payer: Medicare Other

## 2016-07-04 DEATH — deceased

## 2016-07-05 ENCOUNTER — Other Ambulatory Visit: Payer: Medicare Other

## 2016-07-06 ENCOUNTER — Other Ambulatory Visit (HOSPITAL_COMMUNITY): Payer: Medicare Other

## 2016-07-06 ENCOUNTER — Ambulatory Visit: Payer: Medicare Other | Admitting: Vascular Surgery

## 2016-07-12 ENCOUNTER — Ambulatory Visit: Payer: Medicare Other | Admitting: Internal Medicine

## 2016-07-12 ENCOUNTER — Ambulatory Visit: Payer: Medicare Other

## 2016-07-12 ENCOUNTER — Other Ambulatory Visit: Payer: Medicare Other

## 2016-07-19 ENCOUNTER — Other Ambulatory Visit: Payer: Medicare Other

## 2016-07-26 ENCOUNTER — Other Ambulatory Visit: Payer: Medicare Other

## 2016-07-27 ENCOUNTER — Ambulatory Visit: Payer: Medicare Other | Admitting: Vascular Surgery

## 2016-07-27 ENCOUNTER — Other Ambulatory Visit (HOSPITAL_COMMUNITY): Payer: Medicare Other

## 2016-08-02 ENCOUNTER — Ambulatory Visit: Payer: Medicare Other | Admitting: Internal Medicine

## 2016-08-02 ENCOUNTER — Other Ambulatory Visit: Payer: Medicare Other

## 2016-08-02 ENCOUNTER — Ambulatory Visit: Payer: Medicare Other

## 2016-09-30 ENCOUNTER — Encounter: Payer: Self-pay | Admitting: *Deleted

## 2016-09-30 DIAGNOSIS — Z006 Encounter for examination for normal comparison and control in clinical research program: Secondary | ICD-10-CM

## 2016-09-30 DIAGNOSIS — C799 Secondary malignant neoplasm of unspecified site: Secondary | ICD-10-CM

## 2016-09-30 NOTE — Progress Notes (Signed)
Death certificate scanned in to epic.

## 2016-10-11 ENCOUNTER — Other Ambulatory Visit: Payer: Self-pay | Admitting: Nurse Practitioner

## 2017-08-17 IMAGING — CT CT PARANASAL SINUSES LIMITED
1 of 2 series · 8 of 11 positions shown, 10 images · non-contrast
Comparison: None.

CLINICAL DATA: Two months history of productive cough. Evaluate for
sinusitis.

EXAM:
CT PARANASAL SINUS LIMITED WITHOUT CONTRAST
TECHNIQUE: Non-contiguous multidetector CT images of the paranasal sinuses were
obtained in a single plane without contrast.

[Series 4: limited sinus st · axial · 0.26mm/px · z∈[+96,+166]mm · 8 of 10 slices shown, 10 images]
[im 2/10  brain]
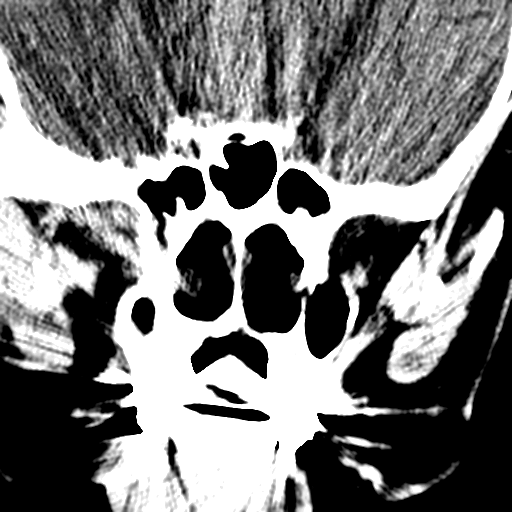
[im 2/10  bone]
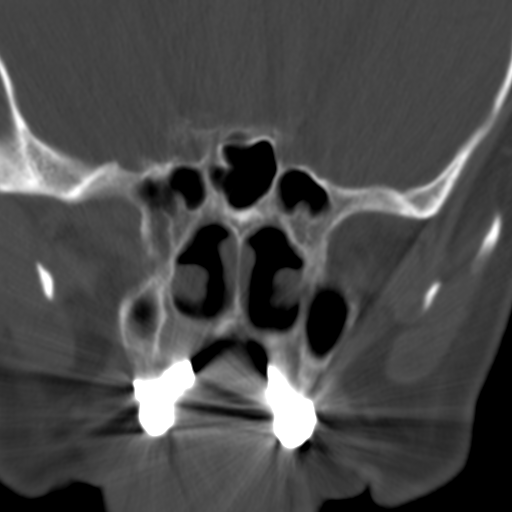
[im 3/10  bone]
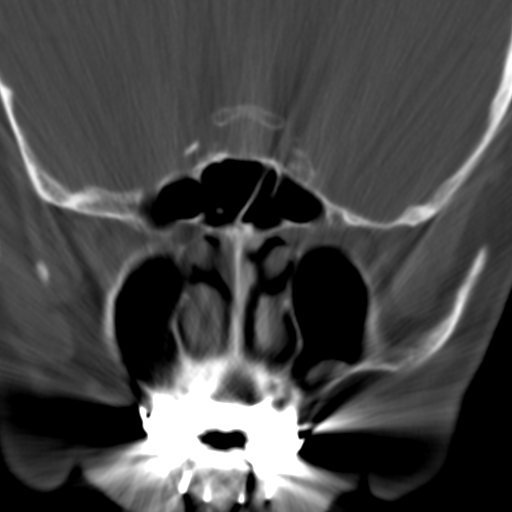
[im 4/10  bone]
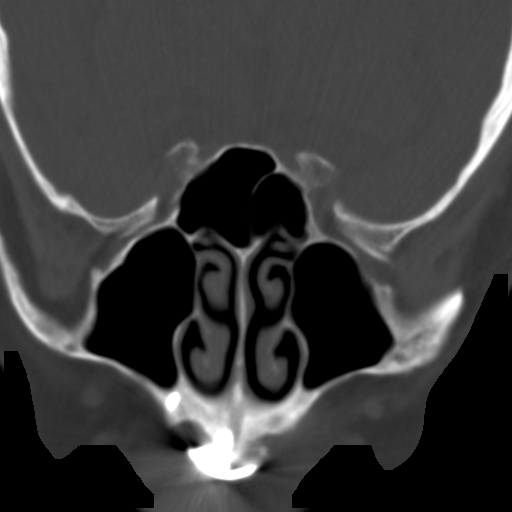
[im 5/10  bone]
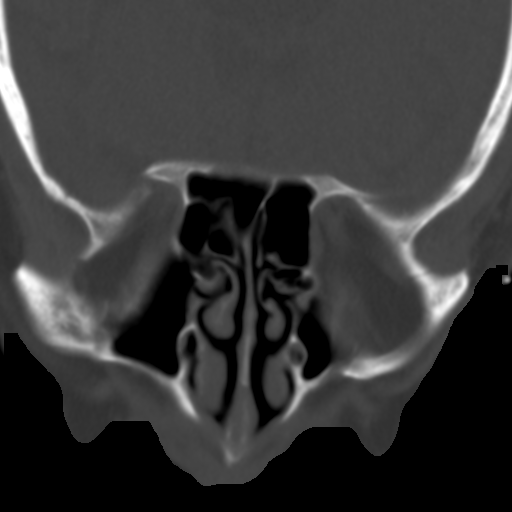
[im 6/10  brain]
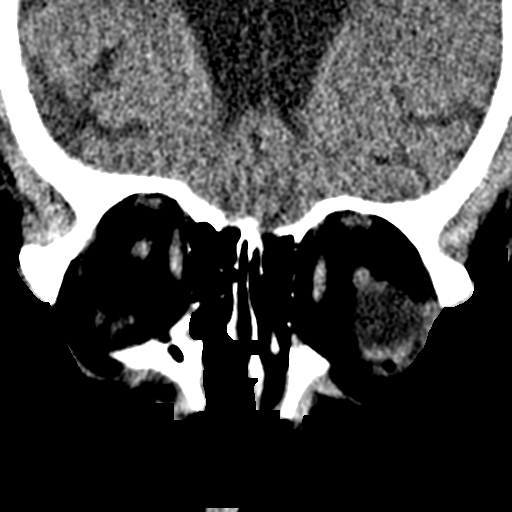
[im 6/10  bone]
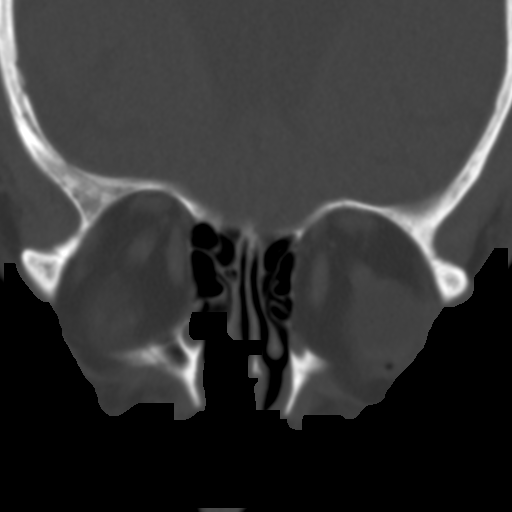
[im 7/10  bone]
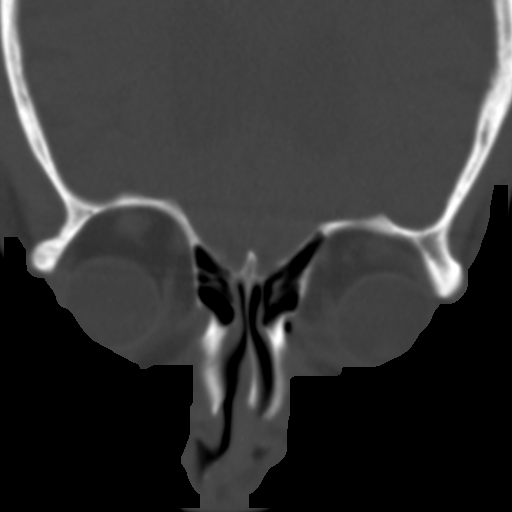
[im 8/10  bone]
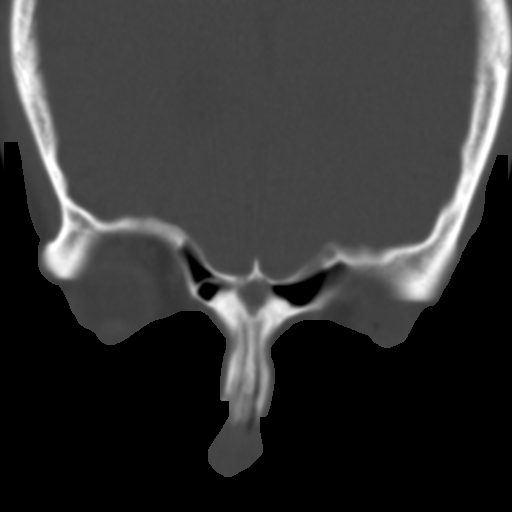
[im 9/10  bone]
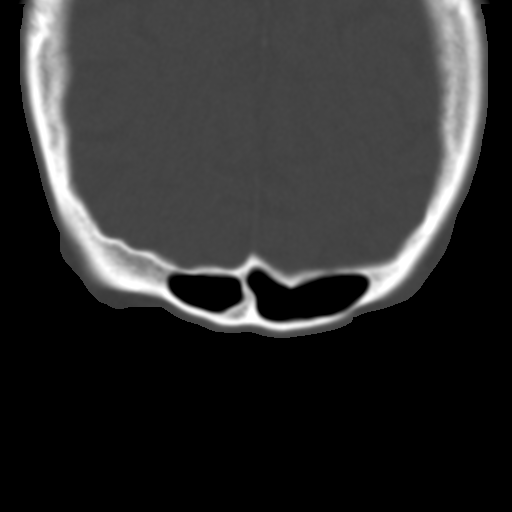

[8 of 11 positions shown; findings below may reference images not displayed]

FINDINGS: The globes and extraocular muscles appear symmetrical. No air fluid
levels in the paranasal sinuses.

The frontal bones, orbital rims, maxillary antral walls, nasal
bones, nasal septum, nasal spine, maxilla, pterygoid plates,
zygomatic arches, temporomandibular joints are intact.
IMPRESSION: No evidence of acute or chronic sinusitis.

## 2017-10-30 IMAGING — DX DG CHEST 1V PORT
1 series · 1 of 1 positions shown · non-contrast
Comparison: PET CT scan 03/12/2016.

CLINICAL DATA: Hypotension in atrial fibrillation today. History of
lung cancer.

EXAM:
PORTABLE CHEST 1 VIEW

[chest ap]
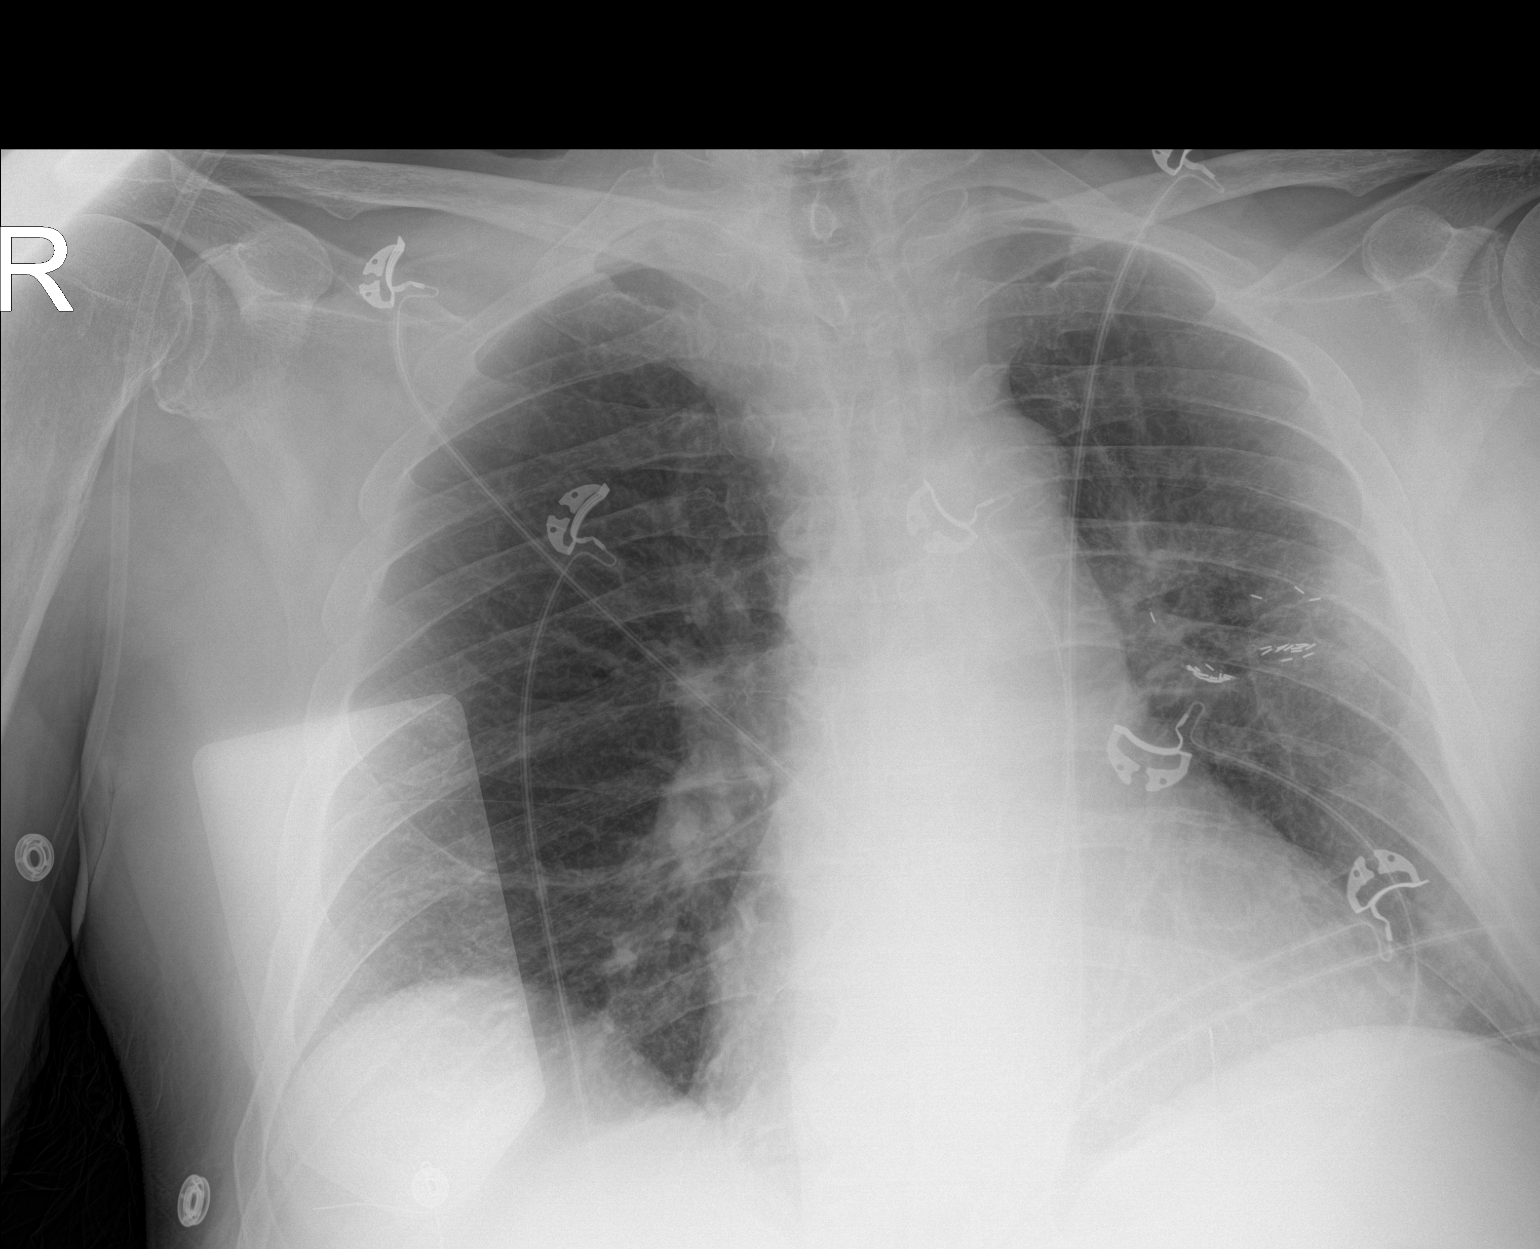

[1 of 1 positions shown; findings below may reference images not displayed]

FINDINGS: Radiotherapy seeds in the left mid lung zone are identified as on
the prior exam. No consolidative process, pneumothorax or effusion.
Heart size is normal. No acute bony abnormality.
IMPRESSION: No acute disease.
